# Patient Record
Sex: Female | Born: 1945 | Race: White | Hispanic: No | Marital: Married | Smoking: Never smoker
Health system: Southern US, Community
[De-identification: ages and names within clinical notes are randomized; demographics above are authoritative.]

## PROBLEM LIST (undated history)

## (undated) DIAGNOSIS — J45909 Unspecified asthma, uncomplicated: Secondary | ICD-10-CM

## (undated) DIAGNOSIS — R51 Headache: Secondary | ICD-10-CM

## (undated) DIAGNOSIS — K08109 Complete loss of teeth, unspecified cause, unspecified class: Secondary | ICD-10-CM

## (undated) DIAGNOSIS — F419 Anxiety disorder, unspecified: Secondary | ICD-10-CM

## (undated) DIAGNOSIS — T8859XA Other complications of anesthesia, initial encounter: Secondary | ICD-10-CM

## (undated) DIAGNOSIS — Z8719 Personal history of other diseases of the digestive system: Secondary | ICD-10-CM

## (undated) DIAGNOSIS — T4145XA Adverse effect of unspecified anesthetic, initial encounter: Secondary | ICD-10-CM

## (undated) DIAGNOSIS — IMO0001 Reserved for inherently not codable concepts without codable children: Secondary | ICD-10-CM

## (undated) DIAGNOSIS — M1990 Unspecified osteoarthritis, unspecified site: Secondary | ICD-10-CM

## (undated) DIAGNOSIS — Z972 Presence of dental prosthetic device (complete) (partial): Secondary | ICD-10-CM

## (undated) DIAGNOSIS — N183 Chronic kidney disease, stage 3 unspecified: Secondary | ICD-10-CM

## (undated) DIAGNOSIS — Z87442 Personal history of urinary calculi: Secondary | ICD-10-CM

## (undated) DIAGNOSIS — R519 Headache, unspecified: Secondary | ICD-10-CM

## (undated) DIAGNOSIS — Z9289 Personal history of other medical treatment: Secondary | ICD-10-CM

## (undated) DIAGNOSIS — J189 Pneumonia, unspecified organism: Secondary | ICD-10-CM

## (undated) DIAGNOSIS — K219 Gastro-esophageal reflux disease without esophagitis: Secondary | ICD-10-CM

## (undated) DIAGNOSIS — J3089 Other allergic rhinitis: Secondary | ICD-10-CM

## (undated) DIAGNOSIS — I10 Essential (primary) hypertension: Secondary | ICD-10-CM

## (undated) DIAGNOSIS — N393 Stress incontinence (female) (male): Secondary | ICD-10-CM

## (undated) DIAGNOSIS — D509 Iron deficiency anemia, unspecified: Secondary | ICD-10-CM

## (undated) DIAGNOSIS — M810 Age-related osteoporosis without current pathological fracture: Secondary | ICD-10-CM

## (undated) DIAGNOSIS — J449 Chronic obstructive pulmonary disease, unspecified: Secondary | ICD-10-CM

## (undated) DIAGNOSIS — E039 Hypothyroidism, unspecified: Secondary | ICD-10-CM

## (undated) HISTORY — PX: EYE SURGERY: SHX253

## (undated) HISTORY — PX: JOINT REPLACEMENT: SHX530

## (undated) HISTORY — PX: HERNIA REPAIR: SHX51

## (undated) HISTORY — PX: COLONOSCOPY: SHX174

## (undated) HISTORY — PX: TONSILLECTOMY: SUR1361

## (undated) HISTORY — PX: BACK SURGERY: SHX140

## (undated) HISTORY — PX: ABDOMINAL HYSTERECTOMY: SHX81

## (undated) HISTORY — PX: FRACTURE SURGERY: SHX138

## (undated) HISTORY — PX: CHOLECYSTECTOMY: SHX55

---

## 2010-09-09 DIAGNOSIS — Z9289 Personal history of other medical treatment: Secondary | ICD-10-CM

## 2010-09-09 HISTORY — DX: Personal history of other medical treatment: Z92.89

## 2011-01-16 ENCOUNTER — Other Ambulatory Visit: Payer: Self-pay | Admitting: Neurosurgery

## 2011-01-16 DIAGNOSIS — M479 Spondylosis, unspecified: Secondary | ICD-10-CM

## 2011-01-17 ENCOUNTER — Ambulatory Visit
Admission: RE | Admit: 2011-01-17 | Discharge: 2011-01-17 | Disposition: A | Discharge status: Home / Self Care | Payer: PRIVATE HEALTH INSURANCE | Source: Ambulatory Visit | Attending: Neurosurgery | Admitting: Neurosurgery

## 2011-01-17 DIAGNOSIS — M479 Spondylosis, unspecified: Secondary | ICD-10-CM

## 2011-02-06 ENCOUNTER — Other Ambulatory Visit (HOSPITAL_COMMUNITY): Payer: Self-pay | Admitting: Neurosurgery

## 2011-02-06 ENCOUNTER — Encounter (HOSPITAL_COMMUNITY)
Admission: RE | Admit: 2011-02-06 | Discharge: 2011-02-06 | Disposition: A | Discharge status: Home / Self Care | Payer: PRIVATE HEALTH INSURANCE | Source: Ambulatory Visit | Attending: Neurosurgery | Admitting: Neurosurgery

## 2011-02-06 ENCOUNTER — Ambulatory Visit (HOSPITAL_COMMUNITY)
Admission: RE | Admit: 2011-02-06 | Discharge: 2011-02-06 | Disposition: A | Discharge status: Home / Self Care | Payer: PRIVATE HEALTH INSURANCE | Source: Ambulatory Visit | Attending: Neurosurgery | Admitting: Neurosurgery

## 2011-02-06 DIAGNOSIS — M47816 Spondylosis without myelopathy or radiculopathy, lumbar region: Secondary | ICD-10-CM

## 2011-02-06 DIAGNOSIS — M48061 Spinal stenosis, lumbar region without neurogenic claudication: Secondary | ICD-10-CM

## 2011-02-06 DIAGNOSIS — J438 Other emphysema: Secondary | ICD-10-CM | POA: Insufficient documentation

## 2011-02-06 DIAGNOSIS — M47817 Spondylosis without myelopathy or radiculopathy, lumbosacral region: Secondary | ICD-10-CM | POA: Insufficient documentation

## 2011-02-06 DIAGNOSIS — Z01812 Encounter for preprocedural laboratory examination: Secondary | ICD-10-CM | POA: Insufficient documentation

## 2011-02-06 DIAGNOSIS — Z01811 Encounter for preprocedural respiratory examination: Secondary | ICD-10-CM | POA: Insufficient documentation

## 2011-02-06 DIAGNOSIS — Z01818 Encounter for other preprocedural examination: Secondary | ICD-10-CM | POA: Insufficient documentation

## 2011-02-06 LAB — BASIC METABOLIC PANEL
CO2: 31 mEq/L (ref 19–32)
Calcium: 9.1 mg/dL (ref 8.4–10.5)
Chloride: 102 mEq/L (ref 96–112)
GFR calc Af Amer: 59 mL/min — ABNORMAL LOW (ref >60)
Sodium: 139 mEq/L (ref 135–145)

## 2011-02-06 LAB — SURGICAL PCR SCREEN
MRSA, PCR: NEGATIVE
Staphylococcus aureus: POSITIVE — AB

## 2011-02-06 LAB — URINALYSIS, ROUTINE W REFLEX MICROSCOPIC
Glucose, UA: NEGATIVE mg/dL
Specific Gravity, Urine: 1.01 (ref 1.005–1.030)
Urobilinogen, UA: 0.2 mg/dL (ref 0.0–1.0)

## 2011-02-06 LAB — CBC
HCT: 40.2 % (ref 36.0–46.0)
MCH: 31.2 pg (ref 26.0–34.0)
MCV: 95 fL (ref 78.0–100.0)
Platelets: 308 10*3/uL (ref 150–400)
RDW: 12.1 % (ref 11.5–15.5)

## 2011-02-06 LAB — URINE MICROSCOPIC-ADD ON

## 2011-02-06 LAB — ABO/RH: ABO/RH(D): A POS

## 2011-02-12 ENCOUNTER — Inpatient Hospital Stay (HOSPITAL_COMMUNITY): Payer: PRIVATE HEALTH INSURANCE

## 2011-02-12 ENCOUNTER — Inpatient Hospital Stay (HOSPITAL_COMMUNITY)
Admission: RE | Admit: 2011-02-12 | Discharge: 2011-02-16 | DRG: 460 | Disposition: A | Discharge status: Home / Self Care | Payer: PRIVATE HEALTH INSURANCE | Source: Ambulatory Visit | Attending: Neurosurgery | Admitting: Neurosurgery

## 2011-02-12 DIAGNOSIS — K2210 Ulcer of esophagus without bleeding: Secondary | ICD-10-CM | POA: Diagnosis present

## 2011-02-12 DIAGNOSIS — K920 Hematemesis: Secondary | ICD-10-CM | POA: Diagnosis not present

## 2011-02-12 DIAGNOSIS — Z981 Arthrodesis status: Secondary | ICD-10-CM

## 2011-02-12 DIAGNOSIS — M48061 Spinal stenosis, lumbar region without neurogenic claudication: Secondary | ICD-10-CM | POA: Diagnosis present

## 2011-02-12 DIAGNOSIS — M4716 Other spondylosis with myelopathy, lumbar region: Principal | ICD-10-CM | POA: Diagnosis present

## 2011-02-12 DIAGNOSIS — K449 Diaphragmatic hernia without obstruction or gangrene: Secondary | ICD-10-CM | POA: Diagnosis present

## 2011-02-13 LAB — CBC
MCV: 95.7 fL (ref 78.0–100.0)
Platelets: 216 10*3/uL (ref 150–400)
RBC: 3.47 MIL/uL — ABNORMAL LOW (ref 3.87–5.11)
WBC: 8 10*3/uL (ref 4.0–10.5)

## 2011-02-13 LAB — BASIC METABOLIC PANEL
Chloride: 105 mEq/L (ref 96–112)
GFR calc Af Amer: >60 mL/min (ref >60)
Potassium: 5 mEq/L (ref 3.5–5.1)
Sodium: 135 mEq/L (ref 135–145)

## 2011-02-13 LAB — HEMOGLOBIN AND HEMATOCRIT, BLOOD
HCT: 32.8 % — ABNORMAL LOW (ref 36.0–46.0)
HCT: 30.6 % — ABNORMAL LOW (ref 36.0–46.0)
Hemoglobin: 10.6 g/dL — ABNORMAL LOW (ref 12.0–15.0)
Hemoglobin: 10 g/dL — ABNORMAL LOW (ref 12.0–15.0)
Hemoglobin: 10.7 g/dL — ABNORMAL LOW (ref 12.0–15.0)

## 2011-02-14 LAB — HEMOGLOBIN AND HEMATOCRIT, BLOOD
HCT: 27.9 % — ABNORMAL LOW (ref 36.0–46.0)
HCT: 28.7 % — ABNORMAL LOW (ref 36.0–46.0)
HCT: 29.5 % — ABNORMAL LOW (ref 36.0–46.0)
HCT: 30.5 % — ABNORMAL LOW (ref 36.0–46.0)
Hemoglobin: 9.4 g/dL — ABNORMAL LOW (ref 12.0–15.0)
Hemoglobin: 9.5 g/dL — ABNORMAL LOW (ref 12.0–15.0)
Hemoglobin: 9.8 g/dL — ABNORMAL LOW (ref 12.0–15.0)
Hemoglobin: 9.8 g/dL — ABNORMAL LOW (ref 12.0–15.0)

## 2011-02-14 LAB — CBC
HCT: 30.4 % — ABNORMAL LOW (ref 36.0–46.0)
Hemoglobin: 9.6 g/dL — ABNORMAL LOW (ref 12.0–15.0)
RDW: 12.2 % (ref 11.5–15.5)
WBC: 10.4 10*3/uL (ref 4.0–10.5)

## 2011-02-14 LAB — TYPE AND SCREEN
ABO/RH(D): A POS
Unit division: 0

## 2011-02-15 LAB — HEMOGLOBIN AND HEMATOCRIT, BLOOD
HCT: 27.5 % — ABNORMAL LOW (ref 36.0–46.0)
HCT: 27 % — ABNORMAL LOW (ref 36.0–46.0)
HCT: 28 % — ABNORMAL LOW (ref 36.0–46.0)
Hemoglobin: 9 g/dL — ABNORMAL LOW (ref 12.0–15.0)
Hemoglobin: 9.1 g/dL — ABNORMAL LOW (ref 12.0–15.0)

## 2011-02-16 LAB — CBC
Hemoglobin: 9.3 g/dL — ABNORMAL LOW (ref 12.0–15.0)
MCH: 31 pg (ref 26.0–34.0)
Platelets: 245 10*3/uL (ref 150–400)
RBC: 3 MIL/uL — ABNORMAL LOW (ref 3.87–5.11)
WBC: 8 10*3/uL (ref 4.0–10.5)

## 2011-03-07 NOTE — Op Note (Signed)
  Jade Walker, Jade Walker             ACCOUNT NO.:  [id number]  MEDICAL RECORD NO.:  [id number]  LOCATION:  3108                         FACILITY:  [facility]  PHYSICIAN:  Clydene Fake, M.D.  DATE OF BIRTH:  March 22, 1946  DATE OF PROCEDURE:  02/12/2011 DATE OF DISCHARGE:                              OPERATIVE REPORT   ADDENDUM  We used a high-speed drill to decorticate the lateral facets and drilling the process of L3-L4, and the rest of the autograft bone with DBX putty along with Healos.  This was packed in the posterolateral space for posterolateral fusion.  Also, when we entered the pedicles, we aspirated the bone marrow aspirate from the pedicles, and this replaced onto the Healos prior to packing the posterolateral gutters.  We then explored the dura and nerve roots again.  We had a good a decompression of the nerve roots.  We had good hemostasis.  Gelfoam was placed over the lateral gutters.  Retractors were removed.  Fascia closed with 0 Vicryl interrupted sutures.  Subcutaneous tissue closed with 2-0 and 3-0 Vicryl interrupted sutures.  Skin closed with benzoin and Steri-Strips. Dressing was placed.  The patient was placed back into supine position, awoken from anesthesia, and transferred to the recovery room in stable condition.          ______________________________ Clydene Fake, M.D.     JRH/MEDQ  D:  02/12/2011  T:  02/13/2011  Job:  [id number]  Electronically Signed by Colon Branch M.D. on 03/07/2011 02:22:30 PM

## 2011-03-07 NOTE — Op Note (Signed)
NAMECHARLEY, LAFRANCE NO.:  [id number]  MEDICAL RECORD NO.:  [id number]  LOCATION:  3108                         FACILITY:  [facility]  PHYSICIAN:  Clydene Fake, M.D.  DATE OF BIRTH:  August 22, 1946  DATE OF PROCEDURE:  02/12/2011 DATE OF DISCHARGE:  02/06/2011                              OPERATIVE REPORT   PREOPERATIVE DIAGNOSES:  Lumbar stenosis spondylosis prior surgery and transitional level with radiculopathy at L3-4.  POSTOPERATIVE DIAGNOSES:  Lumbar stenosis spondylosis prior surgery and transitional level with radiculopathy at L3-4.  PROCEDURE:  Redo decompressive laminectomy, decompressing the L3-L4 (two levels), posterior lumbar interbody fusion L3-4, Saber interbody cages at L3-4, Expedium nonsegmental pedicle screw fixation L3-4, posterolateral fusion L3-4, autograft, same incision; allograft (Healos), bone marrow aspirate.  SURGEON:  Clydene Fake, MD  ASSISTANT:  Cristi Loron, MD  ANESTHESIA:  General endotracheal tube anesthesia.  ESTIMATED BLOOD LOSS:  800 mL.  BLOOD GIVEN:  250 mL, CellSaver return.  COMPLICATIONS:  None.  DRAINS:  None.  REASON FOR PROCEDURE:  The patient is 65 year old woman with a significant back and right leg pain and epidural injections did help, but the pain started worsening.  Workup included x-rays, MRI, and CT procedure prior to __________ at L3-4.  There is a significant facet hypertrophy __________disk generation causing severe stenosis.  The patient brought in for  decompression and fusion at the L3-4 levels.  PROCEDURE IN DETAIL:  The patient was brought to the operating room and general anesthesia was induced.  The patient was placed in a prone position on a Wilson frame with all pressure points padded.  The patient was prepped and draped in sterile fashion.  General anesthesia, injected with 20 mL of 1% lidocaine with epinephrine.  Incision was then made at site of previous scar.  In the  midline, lumbar spine was opened and sized and incision taken down to fascia.  Hemostasis was obtained with Bovie cauterization.  The fascia was incised with a Bovie on the right side, and subperiosteal dissection was done over the L4 spinous process and lamina dissected inferiorly where we __________ pedicle screws and rods, we dissected this out __________ rods and screws.  We were able to remove the rod construct __________.  Continued dissection, __________ transverse process of L3 and sent down to the lamina of __________ scar, and the spinous process of 3 was actually removed.  We irrigated the __________ lamina and then laminectomy was done with Kerrison punches and Leksell rongeurs decompressing the central canal L3-L4.  Medial facetectomies were done bilaterally __________lateral gutters with __________  extensive decompression __________ .  We removed the __________ very good decompression at that point __________ bilaterally. __________ diskectomy.  Disk space was prepared for interbody fusion with interbody spreaders, scrapers, and broaches.  We were able to distract the interspace 10 mm __________ interbody fusion.  Two separate cages, 10 mm long were packed with autograft bone, __________ disk space and the cages in good position bilaterally __________.  We then removed the 2 separate screws __________  rods and the interbody cages.  The high-speed drill to __________  DICTATION ENDED AT THIS POINT.  ______________________________ Clydene Fake, M.D.     JRH/MEDQ  D:  02/12/2011  T:  02/13/2011  Job:  [id number]  Electronically Signed by Colon Branch M.D. on 03/07/2011 02:22:31 PM

## 2011-03-27 NOTE — Op Note (Signed)
  NAMESAHILY, BIDDLE             ACCOUNT NO.:  [id number]  MEDICAL RECORD NO.:  [id number]  LOCATION:  3108                         FACILITY:  [facility]  PHYSICIAN:  Nioka Thorington C. Madilyn Fireman, M.D.    DATE OF BIRTH:  July 30, 1946  DATE OF PROCEDURE:  02/15/2011 DATE OF DISCHARGE:                              OPERATIVE REPORT   REASON FOR CONSULTATION:  Hematemesis with previous EGD showing adherent clot in esophagus with poorly visualized mucosa underneath.  PROCEDURE:  The patient was placed in the left lateral decubitus position and placed on the pulse monitor with continuous low-flow oxygen delivered by nasal cannula.  She was sedated with 40 mcg IV fentanyl and 4 mg IV Versed.  Olympus video endoscope was advanced under direct vision into the oropharynx and esophagus.  The esophagus was straight and of normal caliber with squamocolumnar line at 38 cm.  There was a long, relatively broad, longitudinally oriented ulcer extending from 30 cm to 37 cm.  It was somewhat broad and covered with an extensive exudate that no clot, no visible vessel.  There was no visible suspicion of neoplasm and no varices seen.  There was a short distance between the distal end of the ulcer and the squamocolumnar line.  There was a fairly large 3-cm prominent hiatal hernia distal to the squamocolumnar line. Stomach was entered, a small amount of liquid secretions were suctioned from the fundus.  Retroflexed view of cardia was unremarkable.  The fundus, body, antrum and pylorus all appeared normal.  The duodenum was entered and both bulb and second portion well inspected and appeared be within normal limits.  The scope was then withdrawn and the patient returned to the recovery room in stable condition.  She tolerated the procedure well.  There were no immediate complications.  IMPRESSION: 1. 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 2. Large hiatal hernia.  PLAN:  Double dose proton pump  inhibitor for 2 months.  We will need repeat endoscopic examination to confirm healing better and completely exclude any neoplastic process.          ______________________________ Everardo All Madilyn Fireman, M.D.     JCH/MEDQ  D:  02/15/2011  T:  02/15/2011  Job:  [id number]  cc:   Dr. Remonia Richter  Electronically Signed by Dorena Cookey M.D. on 03/27/2011 06:57:54 PM

## 2011-03-27 NOTE — Op Note (Signed)
  NAMEJALESSA, Jade Walker             ACCOUNT NO.:  [id number]  MEDICAL RECORD NO.:  [id number]  LOCATION:  3108                         FACILITY:  [facility]  PHYSICIAN:  Aaira Oestreicher C. Madilyn Fireman, M.D.    DATE OF BIRTH:  November 18, 1945  DATE OF PROCEDURE:  02/13/2011 DATE OF DISCHARGE:                              OPERATIVE REPORT   PROCEDURE:  Esophagogastroduodenoscopy.  SURGEON:  Carrington Mullenax C. Madilyn Fireman, MD  INDICATIONS FOR PROCEDURE:  Hematemesis after back surgery yesterday.  PROCEDURE IN DETAIL:  The patient was placed in the left lateral decubitus position and placed on the pulse monitor with continuous low- flow oxygen delivered via nasal cannula.  She was sedated with 50 mcg IV fentanyl and 4 mg IV Versed.  The Olympus video endoscope was advanced under direct vision into the oropharynx and esophagus.  The esophagus was straight and of normal caliber.  However, there was a fairly large well-organized clot in the esophagus.  It had an oblong shape and appeared to be attached in the distal esophagus.  Despite trying to knocking off with lavage and suction up to the endoscopic channel, the clot would not dislodge.  There appeared to be exudate at the distal margin of the clot extending to the GE junction above a fairly large hiatal hernia indicating esophagitis and possible esophageal ulcer.  I elected not to snare off the clot or do any more aggressive maneuvers. No varices were seen and no definite Mallory-Weiss tear, although I could not rule this out.  The stomach was entered and there was a moderate amount of clotted blood and dark bilious fluid in the fundus, not all of which would suction or lavage away to allow inspection of the entire stomach.  Retroflexed view of cardia revealed a large hiatal hernia and no other abnormalities.  With the exception of the dependent portions of the gastric lake some of which I could not see, the fundus, body, and her antrum and pylorus all appeared normal.  The  duodenum was entered.  Both bulb and second portion were inspected and appeared to be within normal limits.  The scope was then withdrawn and the patient returned to the recovery room in stable condition.  She tolerated the procedure well and there were no immediate complications.  IMPRESSION:  Adherent blood clot in the esophagus, possibly overlying distal ulcer or Mallory-Weiss tear.  PLAN:  Keep on clear liquid diet, intensive IV proton pump inhibitor therapy, and we will probably repeat endoscopy in 2 days.          ______________________________ Everardo All Madilyn Fireman, M.D.     JCH/MEDQ  D:  02/13/2011  T:  02/14/2011  Job:  [id number]  Electronically Signed by Dorena Cookey M.D. on 03/27/2011 06:57:52 PM

## 2011-04-03 NOTE — Consult Note (Signed)
  NAME:  JANYE, MAYNOR NO.:  [id number]  MEDICAL RECORD NO.:  [id number]  LOCATION:  3108                         FACILITY:  [facility]  PHYSICIAN:  Graylin Shiver, M.D.   DATE OF BIRTH:  02-27-1946  DATE OF CONSULTATION:  02/13/2011 DATE OF DISCHARGE:                                CONSULTATION   REASON FOR CONSULTATION:  The patient is a 65 year old female status post lumbar laminectomy today.  She was doing fine after the surgery and in fact had a late dinner this evening, finishing some chicken and potatoes around 8 or 8:30.  Later in the evening, she started to vomit small amounts of bright red blood.  She has been noticing intermittent dysphagia to meat for the past year.  She does have a history of a hiatal hernia repair years ago and recalls having had her esophagus stretched prior to that.  She gives no history of peptic ulcer disease. She denies alcohol or liver disease.  There is no history of melena or hematochezia.  PAST MEDICAL HISTORY:  Hiatal hernia, asthma.  SURGERIES:  Hiatal hernia surgery, cholecystectomy, total abdominal hysterectomy, lumbar laminectomy today.  ALLERGIES:  HIGH-DOSE HYDROCODONE.  SOCIAL HISTORY:  Negative for alcohol or smoking.  SYSTEMS REVIEW:  No other specific complaints at this time.  PHYSICAL EXAMINATION:  VITAL SIGNS:  Stable.  She has been transferred over to the ICU.  She is not hypotensive and her pulse is 65. GENERAL:  She is alert and oriented and does not appear in any acute distress. HEART:  Reveals a regular rhythm. LUNGS:  Clear. ABDOMEN:  Normal bowel sounds.  It is soft and nontender.  Hemoglobin and hematocrit this evening are 10.8 and 33.2 respectively. Hemoglobin and hematocrit on Feb 06, 2011, were 13.2 and 40.2 respectively.  IMPRESSION: 1. Upper gastrointestinal bleed. 2. Status post lumbar laminectomy today. 3. History of hiatal hernia repair in the past. 4. Intermittent dysphagia over the  past year, rule out esophageal     stricture versus other etiologies.  PLAN:  The patient has been transferred to the ICU this evening.  She appears clinically stable.  I will begin her on an IV Protonix drip after a bolus, obtain serial H and H's, blood will be available for transfusion as necessary.  We will plan endoscopy in the morning and I do not think she needs an emergency endoscopy immediately that should the situation change, we will be available.          ______________________________ Graylin Shiver, M.D.     SFG/MEDQ  D:  02/13/2011  T:  02/13/2011  Job:  [id number]  cc:   Clydene Fake, M.D. Dr. Altamease Oiler  Electronically Signed by Herbert Moors MD on 04/03/2011 03:41:47 PM

## 2011-04-08 NOTE — Discharge Summary (Signed)
  [name], [name]:  [id number]  MEDICAL RECORD NO.:  [id number]  LOCATION:  3005                         FACILITY:  [facility]  PHYSICIAN:  Clydene Fake, M.D.  DATE OF BIRTH:  03/10/1946  DATE OF ADMISSION:  02/12/2011 DATE OF DISCHARGE:  02/16/2011                              DISCHARGE SUMMARY   ADMISSION DIAGNOSES:  Lumbar stenosis, spondylosis with prior surgery and transition level at L3-L4 with radiculopathy, gastric ulcer with upper gastrointestinal bleeding.  DISCHARGE DIAGNOSES:  Lumbar stenosis, spondylosis with prior surgery and transition level at L3-L4 with radiculopathy, gastric ulcer with upper gastrointestinal bleeding.  PROCEDURE:  Redo decompressive laminectomy, decompressing the L3 and L4 nerve roots with posterior lumbar interbody fusion L3-4, Saber interbody cages and Expedium nonsegmental pedicle screw fixation with posterolateral fusion autograft, allograft, and bone marrow aspirate.  REASON FOR ADMISSION:  The patient is a 65 year old woman with a significant back and right leg pain and epidural injections which did help, but the pain has been worsening.  Workup included x-rays, MRI, and CT showing significant facet hypertrophy, degenerative disk disease and disk bulge causing severe stenosis at L3-4 level.  The patient brought in for decompression and fusion.  HOSPITAL COURSE:  The patient was admitted on the day of surgery and underwent the procedure without complications.  Postop, the patient was transferred to the recovery room and then to the floor.  Postop she was moving  all four extremities well and doing well.  She continued making progress, but the first night of surgery on February 16, 2011, with acute GI hemorrhage and the patient was  transferred to the intensive care unit and GI medicine was consulted.  On February 13, 2011, the patient holding steady with her  H and H and was getting ready for EGD, which was done and  showed early clot but  no active bleeding and they wanted to repeat it in a couple of days.  She continued to be watched, doing well.  She started to be up ambulating, legs working much better than the preop status and  neurosurgically was doing well.  The patient continued being up, a little bit of blood but was otherwise stable.  EGD was reviewed and showed a linear ulcer, but no clots.  No active bleeding and actually well treated medically and told to use  proton pump inhibitors and followup with GI in a month and potentially repeat endoscopic evaluation at some point down the road.  The patient with  Neurosurgery was doing well.  The patient was discharged to home  on February 16, 2011.  DISCHARGE MEDS:  Same as prehospitalization plus Protonix 40 mg b.i.d. and Vicodin p.r.n. pain.  Follow up with me in 2-3 weeks with GI medicine in 3-4 weeks.          ______________________________ Clydene Fake, M.D.     JRH/MEDQ  D:  03/25/2011  T:  03/25/2011  Job:  [id number]  Electronically Signed by Colon Branch M.D. on 04/08/2011 11:35:25 AM

## 2015-03-01 ENCOUNTER — Other Ambulatory Visit (HOSPITAL_COMMUNITY): Payer: Self-pay | Admitting: Orthopedic Surgery

## 2015-03-03 ENCOUNTER — Encounter (HOSPITAL_COMMUNITY)
Admission: RE | Admit: 2015-03-03 | Discharge: 2015-03-03 | Disposition: A | Discharge status: Home / Self Care | Payer: Medicare HMO | Source: Ambulatory Visit | Attending: Orthopedic Surgery | Admitting: Orthopedic Surgery

## 2015-03-03 ENCOUNTER — Encounter (HOSPITAL_COMMUNITY): Payer: Self-pay

## 2015-03-03 ENCOUNTER — Other Ambulatory Visit (HOSPITAL_COMMUNITY): Payer: Self-pay | Admitting: *Deleted

## 2015-03-03 ENCOUNTER — Ambulatory Visit (HOSPITAL_COMMUNITY)
Admission: RE | Admit: 2015-03-03 | Discharge: 2015-03-03 | Disposition: A | Discharge status: Home / Self Care | Payer: Medicare HMO | Source: Ambulatory Visit | Attending: Orthopedic Surgery | Admitting: Orthopedic Surgery

## 2015-03-03 DIAGNOSIS — Z01812 Encounter for preprocedural laboratory examination: Secondary | ICD-10-CM | POA: Diagnosis not present

## 2015-03-03 DIAGNOSIS — Z01810 Encounter for preprocedural cardiovascular examination: Secondary | ICD-10-CM | POA: Diagnosis not present

## 2015-03-03 DIAGNOSIS — Z01818 Encounter for other preprocedural examination: Secondary | ICD-10-CM

## 2015-03-03 DIAGNOSIS — I10 Essential (primary) hypertension: Secondary | ICD-10-CM | POA: Diagnosis not present

## 2015-03-03 DIAGNOSIS — M5134 Other intervertebral disc degeneration, thoracic region: Secondary | ICD-10-CM | POA: Diagnosis not present

## 2015-03-03 DIAGNOSIS — K219 Gastro-esophageal reflux disease without esophagitis: Secondary | ICD-10-CM | POA: Insufficient documentation

## 2015-03-03 DIAGNOSIS — R05 Cough: Secondary | ICD-10-CM | POA: Insufficient documentation

## 2015-03-03 DIAGNOSIS — Z0183 Encounter for blood typing: Secondary | ICD-10-CM | POA: Insufficient documentation

## 2015-03-03 DIAGNOSIS — K449 Diaphragmatic hernia without obstruction or gangrene: Secondary | ICD-10-CM | POA: Diagnosis not present

## 2015-03-03 DIAGNOSIS — J45909 Unspecified asthma, uncomplicated: Secondary | ICD-10-CM | POA: Insufficient documentation

## 2015-03-03 HISTORY — DX: Anxiety disorder, unspecified: F41.9

## 2015-03-03 HISTORY — DX: Unspecified asthma, uncomplicated: J45.909

## 2015-03-03 HISTORY — DX: Other allergic rhinitis: J30.89

## 2015-03-03 HISTORY — DX: Iron deficiency anemia, unspecified: D50.9

## 2015-03-03 HISTORY — DX: Adverse effect of unspecified anesthetic, initial encounter: T41.45XA

## 2015-03-03 HISTORY — DX: Other complications of anesthesia, initial encounter: T88.59XA

## 2015-03-03 HISTORY — DX: Essential (primary) hypertension: I10

## 2015-03-03 HISTORY — DX: Personal history of other diseases of the digestive system: Z87.19

## 2015-03-03 HISTORY — DX: Stress incontinence (female) (male): N39.3

## 2015-03-03 HISTORY — DX: Unspecified osteoarthritis, unspecified site: M19.90

## 2015-03-03 HISTORY — DX: Pneumonia, unspecified organism: J18.9

## 2015-03-03 HISTORY — DX: Complete loss of teeth, unspecified cause, unspecified class: K08.109

## 2015-03-03 HISTORY — DX: Gastro-esophageal reflux disease without esophagitis: K21.9

## 2015-03-03 HISTORY — DX: Age-related osteoporosis without current pathological fracture: M81.0

## 2015-03-03 HISTORY — DX: Complete loss of teeth, unspecified cause, unspecified class: Z97.2

## 2015-03-03 HISTORY — DX: Hypothyroidism, unspecified: E03.9

## 2015-03-03 LAB — URINALYSIS, ROUTINE W REFLEX MICROSCOPIC
Bilirubin Urine: NEGATIVE
GLUCOSE, UA: NEGATIVE mg/dL
KETONES UR: NEGATIVE mg/dL
LEUKOCYTES UA: NEGATIVE
NITRITE: NEGATIVE
PROTEIN: NEGATIVE mg/dL
Specific Gravity, Urine: 1.006 (ref 1.005–1.030)
UROBILINOGEN UA: 0.2 mg/dL (ref 0.0–1.0)
pH: 7 (ref 5.0–8.0)

## 2015-03-03 LAB — BASIC METABOLIC PANEL
Anion gap: 8 (ref 5–15)
BUN: 12 mg/dL (ref 6–20)
CO2: 29 mmol/L (ref 22–32)
Calcium: 8.9 mg/dL (ref 8.9–10.3)
Chloride: 99 mmol/L — ABNORMAL LOW (ref 101–111)
Creatinine, Ser: 1.42 mg/dL — ABNORMAL HIGH (ref 0.44–1.00)
GFR, EST AFRICAN AMERICAN: 43 mL/min — AB (ref >60)
GFR, EST NON AFRICAN AMERICAN: 37 mL/min — AB (ref >60)
GLUCOSE: 89 mg/dL (ref 65–99)
Potassium: 3.4 mmol/L — ABNORMAL LOW (ref 3.5–5.1)
Sodium: 136 mmol/L (ref 135–145)

## 2015-03-03 LAB — URINE MICROSCOPIC-ADD ON

## 2015-03-03 LAB — CBC
HEMATOCRIT: 36.4 % (ref 36.0–46.0)
HEMOGLOBIN: 11.6 g/dL — AB (ref 12.0–15.0)
MCH: 29.8 pg (ref 26.0–34.0)
MCHC: 31.9 g/dL (ref 30.0–36.0)
MCV: 93.6 fL (ref 78.0–100.0)
PLATELETS: 314 10*3/uL (ref 150–400)
RBC: 3.89 MIL/uL (ref 3.87–5.11)
RDW: 12.8 % (ref 11.5–15.5)
WBC: 6.3 10*3/uL (ref 4.0–10.5)

## 2015-03-03 LAB — TYPE AND SCREEN
ABO/RH(D): A POS
ANTIBODY SCREEN: NEGATIVE

## 2015-03-03 LAB — SURGICAL PCR SCREEN
MRSA, PCR: POSITIVE — AB
Staphylococcus aureus: POSITIVE — AB

## 2015-03-03 NOTE — Pre-Procedure Instructions (Signed)
Jade Walker  03/03/2015     Your procedure is scheduled on Tuesday, March 14, 2015 at 7:30 AM.   Report to [facility] Entrance "A" Admitting Office at 5:30 AM.   Call this number if you have problems the morning of surgery: [phone]   Any questions prior to day of surgery, please call [phone] between 8 & 4 PM.   Remember:  Do not eat food or drink liquids after midnight Monday, 03/13/15.  Take these medicines the morning of surgery with A SIP OF WATER: Cetirizine (Zyrtec), Citalopram (Celexa), Levothyroxine (Synthroid), Omeprazole (Prilosec), Advair, Hydrocodone - if needed  Stop NSAIDS (Meloxicam, Aleve, Ibuprofen, etc) 7 days prior to surgery. Do not use any Aspirin products prior to surgery    Do not wear jewelry, make-up or nail polish.  Do not wear lotions, powders, or perfumes.  You may wear deodorant.  Do not shave 48 hours prior to surgery.   Do not bring valuables to the hospital.  [facility] is not responsible for any belongings or valuables.  Contacts, dentures or bridgework may not be worn into surgery.  Leave your suitcase in the car.  After surgery it may be brought to your room.  For patients admitted to the hospital, discharge time will be determined by your treatment team.  Patients discharged the day of surgery will not be allowed to drive home.   Special instructions:   - Preparing for Surgery  Before surgery, you can play an important role.  Because skin is not sterile, your skin needs to be as free of germs as possible.  You can reduce the number of germs on you skin by washing with CHG (chlorahexidine gluconate) soap before surgery.  CHG is an antiseptic cleaner which kills germs and bonds with the skin to continue killing germs even after washing.  Please DO NOT use if you have an allergy to CHG or antibacterial soaps.  If your skin becomes reddened/irritated stop using the CHG and inform your nurse when you arrive at Short  Stay.  Do not shave (including legs and underarms) for at least 48 hours prior to the first CHG shower.  You may shave your face.  Please follow these instructions carefully:   1.  Shower with CHG Soap the night before surgery and the                                morning of Surgery.  2.  If you choose to wash your hair, wash your hair first as usual with your       normal shampoo.  3.  After you shampoo, rinse your hair and body thoroughly to remove the                      Shampoo.  4.  Use CHG as you would any other liquid soap.  You can apply chg directly       to the skin and wash gently with scrungie or a clean washcloth.  5.  Apply the CHG Soap to your body ONLY FROM THE NECK DOWN.        Do not use on open wounds or open sores.  Avoid contact with your eyes, ears, mouth and genitals (private parts).  Wash genitals (private parts) with your normal soap.  6.  Wash thoroughly, paying special attention to the area where your surgery  will be performed.  7.  Thoroughly rinse your body with warm water from the neck down.  8.  DO NOT shower/wash with your normal soap after using and rinsing off       the CHG Soap.  9.  Pat yourself dry with a clean towel.            10.  Wear clean pajamas.            11.  Place clean sheets on your bed the night of your first shower and do not        sleep with pets.  Day of Surgery  Do not apply any lotions the morning of surgery.  Please wear clean clothes to the hospital.    Please read over the following fact sheets that you were given. Pain Booklet, Coughing and Deep Breathing, MRSA Information and Surgical Site Infection Prevention

## 2015-03-03 NOTE — Progress Notes (Signed)
Mupirocin Ointment rx called into [facility] on [address] in [place] for positive PCR and Staph. Pt notified and voiced understanding.

## 2015-03-03 NOTE — Progress Notes (Signed)
Called Dr. Diamantina Providence office to request pre-op orders be signed, spoke with Toniann Fail.

## 2015-03-05 LAB — URINE CULTURE

## 2015-03-06 ENCOUNTER — Other Ambulatory Visit (HOSPITAL_COMMUNITY): Payer: Self-pay | Admitting: Orthopedic Surgery

## 2015-03-07 ENCOUNTER — Encounter (HOSPITAL_COMMUNITY): Payer: Self-pay

## 2015-03-07 ENCOUNTER — Encounter (HOSPITAL_COMMUNITY): Payer: Self-pay | Admitting: Vascular Surgery

## 2015-03-07 NOTE — Progress Notes (Signed)
Anesthesia Chart Review: Patient is a 69 year old female scheduled for left TKA on 03/14/15 by Dr. August Walker.  History includes non-smoker, GERD, anxiety, stress incontinence, iron deficiency anemia, hiatal hernia repair , HTN, asthma, hypothyroidism, nephrolithiasis, asthma, hysterectomy, tonsillectomy, cholecystectomy, decompressive laminectomy L4-3 with PLIF 02/12/11 complicated by post-operative hematemesis that evening (EGD showed esophageal ulcer). BMI is 30 consistent with mild obesity. PCP is Jade ShepherdLori Beane, PA-C (see Care Everywhere). Office visit from 02/10/15 indicates that PCP was aware that patient was in need for right shoulder surgery and left TKA. Patient is planning GI (Dr. Madilyn Walker) follow-up post-operatively, but notes indicate her H/H have been stable.   02/13/11 EGD: IMPRESSION: Adherent blood clot in the esophagus, possibly overlying distal ulcer or Mallory-Weiss tear. F/U 02/15/11 EGD: IMPRESSION: 1. 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 2. Large hiatal hernia.  Meds include Zyrtec, Celexa, Advair, levothyroxine, Mobic, Prilosec, Zanaflex, Norco, diethylpropion (sympathomimetic used for weight loss). I spoke with Jade Walker in Pharmacy, recommended patient hold diethylpropion over the weekend and not resume until after her surgery. I also reviewed diethylpropion with anesthesiologist Dr. Renold Walker with recommendation to go a head a hold this medication until after surgery.  I notified patient.   03/03/15 EKG: NSR, poor r wave progression. No significant change since 02/12/11 tracing.  10/30/10 Stress echo (HPR): Poor exercise tolerance. Low normal resting LV systolic function 55%. Hypertensive resting BP with normal response. LV function at peak exercise in not hyperdynamic but there are no focal wall motion abnormalities. Negative exercise treadmill test. Equivocal stress echo with no focal wall motion abnormality post exercise, but without hyperdynamic response.   03/03/15  CXR: IMPRESSION: Moderate-sized hiatal hernia. Bronchitic changes and hyperinflation consistent with COPD.  Preoperative labs noted. Cr 1.42. Glucose 89. H/H 11.6/36.4. Patient denied any known obvious GI blodd loss. She is on a PPI.  If no acute changes then I would anticipate that she could proceed as planned.   Jade Ochsllison Hillary Schwegler, PA-C [facility] Phone [phone] 03/07/2015 4:48 PM

## 2015-03-10 ENCOUNTER — Other Ambulatory Visit: Payer: Self-pay | Admitting: Orthopedic Surgery

## 2015-03-10 NOTE — Progress Notes (Signed)
Pt previously contacted by surgeons office; pt advised  to arrive at 8:15 AM instead of 8:30 AM as office had instructed.

## 2015-03-13 MED ORDER — VANCOMYCIN HCL IN DEXTROSE 1-5 GM/200ML-% IV SOLN: 1000.0000 mg | INTRAVENOUS | Status: DC

## 2015-03-13 MED ORDER — VANCOMYCIN HCL IN DEXTROSE 1-5 GM/200ML-% IV SOLN
1000.0000 mg | INTRAVENOUS | Status: AC
  Administered 2015-03-14: 1000 mg via INTRAVENOUS
  Filled 2015-03-13: qty 200

## 2015-03-14 ENCOUNTER — Ambulatory Visit (HOSPITAL_COMMUNITY)
Admission: RE | Admit: 2015-03-14 | Discharge: 2015-03-14 | Disposition: A | Discharge status: Home / Self Care | Payer: Medicare HMO | Source: Ambulatory Visit | Attending: Orthopedic Surgery | Admitting: Orthopedic Surgery

## 2015-03-14 ENCOUNTER — Inpatient Hospital Stay (HOSPITAL_COMMUNITY): Admission: RE | Admit: 2015-03-14 | Payer: Medicare HMO | Source: Ambulatory Visit | Admitting: Orthopedic Surgery

## 2015-03-14 ENCOUNTER — Encounter (HOSPITAL_COMMUNITY): Payer: Self-pay | Admitting: *Deleted

## 2015-03-14 ENCOUNTER — Encounter (HOSPITAL_COMMUNITY)
Admission: RE | Disposition: A | Discharge status: Home / Self Care | Payer: Self-pay | Source: Ambulatory Visit | Attending: Orthopedic Surgery

## 2015-03-14 ENCOUNTER — Ambulatory Visit (HOSPITAL_COMMUNITY): Payer: Medicare HMO

## 2015-03-14 ENCOUNTER — Ambulatory Visit (HOSPITAL_COMMUNITY): Payer: Medicare HMO | Admitting: Anesthesiology

## 2015-03-14 ENCOUNTER — Encounter (HOSPITAL_COMMUNITY): Admission: RE | Payer: Self-pay | Source: Ambulatory Visit

## 2015-03-14 DIAGNOSIS — S62109A Fracture of unspecified carpal bone, unspecified wrist, initial encounter for closed fracture: Secondary | ICD-10-CM

## 2015-03-14 DIAGNOSIS — W1830XA Fall on same level, unspecified, initial encounter: Secondary | ICD-10-CM | POA: Insufficient documentation

## 2015-03-14 DIAGNOSIS — Z87442 Personal history of urinary calculi: Secondary | ICD-10-CM | POA: Diagnosis not present

## 2015-03-14 DIAGNOSIS — I10 Essential (primary) hypertension: Secondary | ICD-10-CM | POA: Diagnosis not present

## 2015-03-14 DIAGNOSIS — Z8249 Family history of ischemic heart disease and other diseases of the circulatory system: Secondary | ICD-10-CM | POA: Insufficient documentation

## 2015-03-14 DIAGNOSIS — S52572A Other intraarticular fracture of lower end of left radius, initial encounter for closed fracture: Secondary | ICD-10-CM | POA: Diagnosis not present

## 2015-03-14 DIAGNOSIS — K449 Diaphragmatic hernia without obstruction or gangrene: Secondary | ICD-10-CM | POA: Diagnosis not present

## 2015-03-14 DIAGNOSIS — M25532 Pain in left wrist: Secondary | ICD-10-CM | POA: Diagnosis not present

## 2015-03-14 DIAGNOSIS — E039 Hypothyroidism, unspecified: Secondary | ICD-10-CM | POA: Diagnosis not present

## 2015-03-14 DIAGNOSIS — J45909 Unspecified asthma, uncomplicated: Secondary | ICD-10-CM | POA: Diagnosis not present

## 2015-03-14 DIAGNOSIS — K219 Gastro-esophageal reflux disease without esophagitis: Secondary | ICD-10-CM | POA: Insufficient documentation

## 2015-03-14 DIAGNOSIS — F419 Anxiety disorder, unspecified: Secondary | ICD-10-CM | POA: Diagnosis not present

## 2015-03-14 DIAGNOSIS — S52502A Unspecified fracture of the lower end of left radius, initial encounter for closed fracture: Secondary | ICD-10-CM | POA: Diagnosis present

## 2015-03-14 DIAGNOSIS — N289 Disorder of kidney and ureter, unspecified: Secondary | ICD-10-CM | POA: Insufficient documentation

## 2015-03-14 HISTORY — PX: OPEN REDUCTION INTERNAL FIXATION (ORIF) DISTAL RADIAL FRACTURE: SHX5989

## 2015-03-14 SURGERY — ARTHROPLASTY, KNEE, TOTAL
Anesthesia: General | Site: Knee | Laterality: Left

## 2015-03-14 SURGERY — OPEN REDUCTION INTERNAL FIXATION (ORIF) DISTAL RADIUS FRACTURE
Anesthesia: Regional | Site: Arm Lower | Laterality: Left

## 2015-03-14 MED ORDER — LIDOCAINE HCL (CARDIAC) 20 MG/ML IV SOLN
INTRAVENOUS | Status: DC | PRN
  Administered 2015-03-14: 40 mg via INTRAVENOUS

## 2015-03-14 MED ORDER — BUPIVACAINE-EPINEPHRINE (PF) 0.25% -1:200000 IJ SOLN
INTRAMUSCULAR | Status: AC
  Filled 2015-03-14: qty 30

## 2015-03-14 MED ORDER — BUPIVACAINE-EPINEPHRINE (PF) 0.5% -1:200000 IJ SOLN: INTRAMUSCULAR | Status: DC | PRN

## 2015-03-14 MED ORDER — PROPOFOL 10 MG/ML IV BOLUS
INTRAVENOUS | Status: AC
  Filled 2015-03-14: qty 20

## 2015-03-14 MED ORDER — FENTANYL CITRATE (PF) 250 MCG/5ML IJ SOLN
INTRAMUSCULAR | Status: AC
  Filled 2015-03-14: qty 5

## 2015-03-14 MED ORDER — DEXAMETHASONE SODIUM PHOSPHATE 4 MG/ML IJ SOLN
INTRAMUSCULAR | Status: AC
  Filled 2015-03-14: qty 1

## 2015-03-14 MED ORDER — EPHEDRINE SULFATE 50 MG/ML IJ SOLN
INTRAMUSCULAR | Status: DC | PRN
  Administered 2015-03-14 (×2): 10 mg via INTRAVENOUS

## 2015-03-14 MED ORDER — CEFAZOLIN SODIUM-DEXTROSE 2-3 GM-% IV SOLR
INTRAVENOUS | Status: DC | PRN
  Administered 2015-03-14: 2 g via INTRAVENOUS

## 2015-03-14 MED ORDER — PROPOFOL 10 MG/ML IV BOLUS
INTRAVENOUS | Status: DC | PRN
  Administered 2015-03-14: 120 mg via INTRAVENOUS

## 2015-03-14 MED ORDER — CEFAZOLIN SODIUM-DEXTROSE 2-3 GM-% IV SOLR
INTRAVENOUS | Status: AC
  Filled 2015-03-14: qty 50

## 2015-03-14 MED ORDER — CEFAZOLIN SODIUM-DEXTROSE 2-3 GM-% IV SOLR: 2.0000 g | Freq: Three times a day (TID) | INTRAVENOUS | Status: DC

## 2015-03-14 MED ORDER — ARTIFICIAL TEARS OP OINT
TOPICAL_OINTMENT | OPHTHALMIC | Status: DC | PRN
  Administered 2015-03-14: 1 via OPHTHALMIC

## 2015-03-14 MED ORDER — MIDAZOLAM HCL 5 MG/ML IJ SOLN
1.0000 mg | Freq: Once | INTRAMUSCULAR | Status: AC
  Administered 2015-03-14: 1 mg via INTRAVENOUS
  Filled 2015-03-14: qty 1

## 2015-03-14 MED ORDER — LACTATED RINGERS IV SOLN
INTRAVENOUS | Status: DC
  Administered 2015-03-14 (×2): via INTRAVENOUS

## 2015-03-14 MED ORDER — ROCURONIUM BROMIDE 50 MG/5ML IV SOLN
INTRAVENOUS | Status: AC
  Filled 2015-03-14: qty 1

## 2015-03-14 MED ORDER — GLYCOPYRROLATE 0.2 MG/ML IJ SOLN
INTRAMUSCULAR | Status: AC
  Filled 2015-03-14: qty 1

## 2015-03-14 MED ORDER — FENTANYL CITRATE (PF) 100 MCG/2ML IJ SOLN
100.0000 ug | Freq: Once | INTRAMUSCULAR | Status: AC
  Administered 2015-03-14: 100 ug via INTRAVENOUS

## 2015-03-14 MED ORDER — 0.9 % SODIUM CHLORIDE (POUR BTL) OPTIME
TOPICAL | Status: DC | PRN
  Administered 2015-03-14: 1000 mL

## 2015-03-14 MED ORDER — DEXAMETHASONE SODIUM PHOSPHATE 4 MG/ML IJ SOLN
INTRAMUSCULAR | Status: DC | PRN
  Administered 2015-03-14: 4 mg via INTRAVENOUS

## 2015-03-14 MED ORDER — SODIUM CHLORIDE 0.9 % IJ SOLN
INTRAMUSCULAR | Status: AC
  Filled 2015-03-14: qty 10

## 2015-03-14 MED ORDER — EPHEDRINE SULFATE 50 MG/ML IJ SOLN
INTRAMUSCULAR | Status: AC
  Filled 2015-03-14: qty 1

## 2015-03-14 MED ORDER — BUPIVACAINE-EPINEPHRINE (PF) 0.5% -1:200000 IJ SOLN
INTRAMUSCULAR | Status: DC | PRN
  Administered 2015-03-14: 30 mL via PERINEURAL

## 2015-03-14 MED ORDER — ONDANSETRON HCL 4 MG/2ML IJ SOLN
INTRAMUSCULAR | Status: DC | PRN
  Administered 2015-03-14: 4 mg via INTRAVENOUS

## 2015-03-14 MED ORDER — ONDANSETRON HCL 4 MG/2ML IJ SOLN
INTRAMUSCULAR | Status: AC
  Filled 2015-03-14: qty 2

## 2015-03-14 MED ORDER — VANCOMYCIN HCL IN DEXTROSE 1-5 GM/200ML-% IV SOLN
1000.0000 mg | INTRAVENOUS | Status: AC
  Administered 2015-03-14: 1000 mg via INTRAVENOUS

## 2015-03-14 SURGICAL SUPPLY — 79 items
BANDAGE ELASTIC 3 VELCRO ST LF (GAUZE/BANDAGES/DRESSINGS) ×3 IMPLANT
BANDAGE ELASTIC 4 VELCRO ST LF (GAUZE/BANDAGES/DRESSINGS) ×3 IMPLANT
BIT DRILL 2.2 SS TIBIAL (BIT) ×3 IMPLANT
BLADE SURG 10 STRL SS (BLADE) ×3 IMPLANT
BNDG ESMARK 4X9 LF (GAUZE/BANDAGES/DRESSINGS) ×3 IMPLANT
BNDG GAUZE ELAST 4 BULKY (GAUZE/BANDAGES/DRESSINGS) IMPLANT
CLOSURE WOUND 1/2 X4 (GAUZE/BANDAGES/DRESSINGS)
CORDS BIPOLAR (ELECTRODE) ×3 IMPLANT
COVER SURGICAL LIGHT HANDLE (MISCELLANEOUS) ×3 IMPLANT
CUFF TOURNIQUET SINGLE 18IN (TOURNIQUET CUFF) ×3 IMPLANT
DRAIN TLS ROUND 10FR (DRAIN) IMPLANT
DRAPE INCISE IOBAN 66X45 STRL (DRAPES) ×3 IMPLANT
DRAPE OEC MINIVIEW 54X84 (DRAPES) ×3 IMPLANT
DRAPE U-SHAPE 47X51 STRL (DRAPES) ×3 IMPLANT
DRSG PAD ABDOMINAL 8X10 ST (GAUZE/BANDAGES/DRESSINGS) ×3 IMPLANT
DURAPREP 26ML APPLICATOR (WOUND CARE) ×3 IMPLANT
ELECT REM PT RETURN 9FT ADLT (ELECTROSURGICAL) ×3
ELECTRODE REM PT RTRN 9FT ADLT (ELECTROSURGICAL) ×1 IMPLANT
FACESHIELD WRAPAROUND (MASK) ×3 IMPLANT
GAUZE SPONGE 4X4 12PLY STRL (GAUZE/BANDAGES/DRESSINGS) IMPLANT
GAUZE XEROFORM 1X8 LF (GAUZE/BANDAGES/DRESSINGS) ×3 IMPLANT
GLOVE BIO SURGEON ST LM GN SZ9 (GLOVE) IMPLANT
GLOVE BIOGEL PI IND STRL 6.5 (GLOVE) ×2 IMPLANT
GLOVE BIOGEL PI IND STRL 7.0 (GLOVE) ×2 IMPLANT
GLOVE BIOGEL PI IND STRL 8 (GLOVE) ×2 IMPLANT
GLOVE BIOGEL PI INDICATOR 6.5 (GLOVE) ×4
GLOVE BIOGEL PI INDICATOR 7.0 (GLOVE) ×4
GLOVE BIOGEL PI INDICATOR 8 (GLOVE) ×4
GLOVE ECLIPSE 7.0 STRL STRAW (GLOVE) ×3 IMPLANT
GLOVE SURG ORTHO 8.0 STRL STRW (GLOVE) ×3 IMPLANT
GLOVE SURG SS PI 6.5 STRL IVOR (GLOVE) ×6 IMPLANT
GOWN STRL REUS W/ TWL LRG LVL3 (GOWN DISPOSABLE) ×3 IMPLANT
GOWN STRL REUS W/ TWL XL LVL3 (GOWN DISPOSABLE) ×1 IMPLANT
GOWN STRL REUS W/TWL LRG LVL3 (GOWN DISPOSABLE) ×6
GOWN STRL REUS W/TWL XL LVL3 (GOWN DISPOSABLE) ×2
K-WIRE 1.6 (WIRE) ×2
K-WIRE FX5X1.6XNS BN SS (WIRE) ×1
KIT BASIN OR (CUSTOM PROCEDURE TRAY) ×3 IMPLANT
KIT ROOM TURNOVER OR (KITS) ×3 IMPLANT
KWIRE FX5X1.6XNS BN SS (WIRE) ×1 IMPLANT
NEEDLE 22X1 1/2 (OR ONLY) (NEEDLE) IMPLANT
NS IRRIG 1000ML POUR BTL (IV SOLUTION) ×3 IMPLANT
PACK ORTHO EXTREMITY (CUSTOM PROCEDURE TRAY) ×3 IMPLANT
PAD ARMBOARD 7.5X6 YLW CONV (MISCELLANEOUS) ×6 IMPLANT
PAD CAST 3X4 CTTN HI CHSV (CAST SUPPLIES) ×1 IMPLANT
PAD CAST 4YDX4 CTTN HI CHSV (CAST SUPPLIES) ×1 IMPLANT
PADDING CAST COTTON 3X4 STRL (CAST SUPPLIES) ×2
PADDING CAST COTTON 4X4 STRL (CAST SUPPLIES) ×2
PEG LOCKING SMOOTH 2.2X16 (Screw) ×3 IMPLANT
PEG LOCKING SMOOTH 2.2X18 (Peg) ×6 IMPLANT
PEG LOCKING SMOOTH 2.2X20 (Screw) ×6 IMPLANT
PEG LOCKING SMOOTH 2.2X22 (Screw) ×3 IMPLANT
PEG LOCKING SMOOTH 2.2X24 (Peg) ×3 IMPLANT
PLATE NARROW DVR LEFT (Plate) ×3 IMPLANT
SCREW LOCK 14X2.7X 3 LD TPR (Screw) ×3 IMPLANT
SCREW LOCK 16X2.7X 3 LD TPR (Screw) ×1 IMPLANT
SCREW LOCKING 2.7X14 (Screw) ×6 IMPLANT
SCREW LOCKING 2.7X15MM (Screw) ×3 IMPLANT
SCREW LOCKING 2.7X16 (Screw) ×2 IMPLANT
SCREW LP NL 2.7X22MM (Screw) ×3 IMPLANT
SLING ARM MED ADULT FOAM STRAP (SOFTGOODS) ×3 IMPLANT
SPONGE GAUZE 4X4 12PLY STER LF (GAUZE/BANDAGES/DRESSINGS) ×3 IMPLANT
SPONGE LAP 4X18 X RAY DECT (DISPOSABLE) ×3 IMPLANT
STAPLER VISISTAT 35W (STAPLE) IMPLANT
STOCKINETTE IMPERVIOUS LG (DRAPES) ×3 IMPLANT
STRIP CLOSURE SKIN 1/2X4 (GAUZE/BANDAGES/DRESSINGS) IMPLANT
SUCTION FRAZIER TIP 10 FR DISP (SUCTIONS) ×3 IMPLANT
SUT ETHILON 3 0 PS 1 (SUTURE) ×6 IMPLANT
SUT PROLENE 3 0 PS 1 (SUTURE) IMPLANT
SUT VIC AB 2-0 CTB1 (SUTURE) IMPLANT
SUT VIC AB 3-0 X1 27 (SUTURE) ×3 IMPLANT
SYR CONTROL 10ML LL (SYRINGE) IMPLANT
SYSTEM CHEST DRAIN TLS 7FR (DRAIN) IMPLANT
TOWEL OR 17X24 6PK STRL BLUE (TOWEL DISPOSABLE) ×3 IMPLANT
TOWEL OR 17X26 10 PK STRL BLUE (TOWEL DISPOSABLE) ×3 IMPLANT
TUBE CONNECTING 12'X1/4 (SUCTIONS) ×1
TUBE CONNECTING 12X1/4 (SUCTIONS) ×2 IMPLANT
WATER STERILE IRR 1000ML POUR (IV SOLUTION) ×3 IMPLANT
YANKAUER SUCT BULB TIP NO VENT (SUCTIONS) ×3 IMPLANT

## 2015-03-14 NOTE — Brief Op Note (Signed)
03/14/2015  12:22 PM  PATIENT:  Luan PullingBrenda H Lohnes  69 y.o. female  PRE-OPERATIVE DIAGNOSIS:  left distal radius fracture  POST-OPERATIVE DIAGNOSIS:  left distal radius fracture  PROCEDURE:  Procedure(s): OPEN REDUCTION INTERNAL FIXATION (ORIF) LEFT DISTAL RADIUS FRACTURE  SURGEON:  Surgeon(s): Cammy CopaScott Gregory Dean, MD  ASSISTANT: carla bethune rnfa   ANESTHESIA:   general  EBL: 15 ml    Total I/O In: 800 [I.V.:800] Out: 50 [Blood:50]  BLOOD ADMINISTERED: none  DRAINS: none   LOCAL MEDICATIONS USED:  none  SPECIMEN:  No Specimen  COUNTS:  YES  TOURNIQUET:   Total Tourniquet Time Documented: Upper Arm (Left) - -10 minutes Total: Upper Arm (Left) - -10 minutes   DICTATION: .Other Dictation: Dictation Number [id number]  PLAN OF CARE: Discharge to home after PACU  PATIENT DISPOSITION:  PACU - hemodynamically stable

## 2015-03-14 NOTE — Anesthesia Procedure Notes (Addendum)
Anesthesia Regional Block:  Supraclavicular block  Pre-Anesthetic Checklist: ,, timeout performed, Correct Patient, Correct Site, Correct Laterality, Correct Procedure, Correct Position, site marked, Risks and benefits discussed, pre-op evaluation, post-op pain management  Laterality: Left  Prep: Maximum Sterile Barrier Precautions used and chloraprep       Needles:  Injection technique: Single-shot  Needle Type: Echogenic Stimulator Needle     Needle Length: 5cm 5 cm Needle Gauge: 22 and 22 G    Additional Needles:  Procedures: ultrasound guided (picture in chart) Supraclavicular block Narrative:  Start time: 03/14/2015 9:30 AM End time: 03/14/2015 9:39 AM Injection made incrementally with aspirations every 5 mL. Anesthesiologist: Gaynelle AduFITZGERALD, WILLIAM  Additional Notes: 2% Lidocaine skin wheel.    Procedure Name: LMA Insertion Date/Time: 03/14/2015 10:40 AM Performed by: Fransisca KaufmannMEYER, Toshiye Kever E Pre-anesthesia Checklist: Patient identified, Emergency Drugs available, Suction available, Patient being monitored and Timeout performed Patient Re-evaluated:Patient Re-evaluated prior to inductionOxygen Delivery Method: Circle system utilized Intubation Type: IV induction Ventilation: Mask ventilation without difficulty LMA: LMA inserted LMA Size: 4.0 Number of attempts: 1 Placement Confirmation: positive ETCO2 and breath sounds checked- equal and bilateral Tube secured with: Tape Dental Injury: Teeth and Oropharynx as per pre-operative assessment

## 2015-03-14 NOTE — Anesthesia Postprocedure Evaluation (Signed)
  Anesthesia Post-op Note  Patient: Jade Walker  Procedure(s) Performed: Procedure(s): OPEN REDUCTION INTERNAL FIXATION (ORIF) LEFT DISTAL RADIUS FRACTURE (Left)  Patient Location: PACU  Anesthesia Type:General and block  Level of Consciousness: awake and alert   Airway and Oxygen Therapy: Patient Spontanous Breathing  Post-op Pain: Controlled  Post-op Assessment: Post-op Vital signs reviewed, Patient's Cardiovascular Status Stable and Respiratory Function Stable  Post-op Vital Signs: Reviewed  Filed Vitals:   03/14/15 1400  BP: 164/83  Pulse: 65  Temp:   Resp:     Complications: No apparent anesthesia complications

## 2015-03-14 NOTE — Anesthesia Preprocedure Evaluation (Addendum)
Anesthesia Evaluation  Patient identified by MRN, date of birth, ID band Patient awake    Reviewed: Allergy & Precautions, H&P , NPO status , Patient's Chart, lab work & pertinent test results  Airway Mallampati: II  TM Distance: >3 FB Neck ROM: Full    Dental no notable dental hx. (+) Upper Dentures, Lower Dentures, Dental Advisory Given   Pulmonary asthma ,  breath sounds clear to auscultation  Pulmonary exam normal       Cardiovascular hypertension, Pt. on medications Rhythm:Regular Rate:Normal     Neuro/Psych Anxiety negative neurological ROS  negative psych ROS   GI/Hepatic Neg liver ROS, hiatal hernia, GERD-  Medicated and Controlled,  Endo/Other  Hypothyroidism   Renal/GU Renal disease  negative genitourinary   Musculoskeletal  (+) Arthritis -, Osteoarthritis,    Abdominal   Peds  Hematology negative hematology ROS (+)   Anesthesia Other Findings   Reproductive/Obstetrics negative OB ROS                            Anesthesia Physical Anesthesia Plan  ASA: II  Anesthesia Plan: General and Regional   Post-op Pain Management:    Induction: Intravenous  Airway Management Planned: LMA  Additional Equipment:   Intra-op Plan:   Post-operative Plan: Extubation in OR  Informed Consent: I have reviewed the patients History and Physical, chart, labs and discussed the procedure including the risks, benefits and alternatives for the proposed anesthesia with the patient or authorized representative who has indicated his/her understanding and acceptance.   Dental advisory given  Plan Discussed with: CRNA  Anesthesia Plan Comments:         Anesthesia Quick Evaluation

## 2015-03-14 NOTE — Transfer of Care (Signed)
Immediate Anesthesia Transfer of Care Note  Patient: Jade Walker  Procedure(s) Performed: Procedure(s): OPEN REDUCTION INTERNAL FIXATION (ORIF) LEFT DISTAL RADIUS FRACTURE (Left)  Patient Location: PACU  Anesthesia Type:General and Regional  Level of Consciousness: awake, alert , oriented and sedated  Airway & Oxygen Therapy: Patient Spontanous Breathing and Patient connected to nasal cannula oxygen  Post-op Assessment: Report given to RN, Post -op Vital signs reviewed and stable and Patient moving all extremities  Post vital signs: Reviewed and stable  Last Vitals:  Filed Vitals:   03/14/15 1015  BP:   Pulse: 78  Temp:   Resp: 24    Complications: No apparent anesthesia complications

## 2015-03-14 NOTE — H&P (Signed)
Jade PullingBrenda H Walker is an 69 y.o. female.   Chief Complaint: Left wrist pain HPI: Jade DroneBrenda 69 year old female with left wrist pain she was scheduled for total knee replacement today but she fell on Friday and sustained a displaced left wrist fracture. She would like to undergo knee replacement as soon as possible and thus presents for elective wrist fixation in order to facilitate quicker recovery so that she can have her knee replacement sooner rather than later due to furniture market concerns. She denies any other orthopedic complaints. She was splinted.  Past Medical History  Diagnosis Date  . GERD (gastroesophageal reflux disease)   . Anxiety   . Stress incontinence     from Care Everywhere  . Iron deficiency anemia     from Care Everywhere  . History of hiatal hernia   . Hypertension   . Asthma   . Pneumonia   . Osteoporosis   . Hypothyroidism   . Kidney stones   . Arthritis     DDD - lumbar (from Care Everywhere), osteoarthritis  . Full dentures   . Environmental and seasonal allergies   . Complication of anesthesia     blood loss after back surgery because hernia was scratched and "torn" during intubation (02/15/11 EGD showed GIB d/t esophageal ulcer)    Past Surgical History  Procedure Laterality Date  . Eye surgery Bilateral     cataract surgery with lens implant  . Colonoscopy    . Tonsillectomy    . Cholecystectomy    . Abdominal hysterectomy    . Back surgery      lumbar x 3, 1 thoracic spine    Family History  Problem Relation Age of Onset  . CAD Mother   . Emphysema Father   . Alcoholism Father    Social History:  reports that she has never smoked. She has never used smokeless tobacco. She reports that she does not drink alcohol or use illicit drugs.  Allergies:  Allergies  Allergen Reactions  . Codeine Other (See Comments)    Large doses make her sweat, heart flutter  . Sulfamethoxazole-Trimethoprim Rash    No prescriptions prior to admission    No  results found for this or any previous visit (from the past 48 hour(s)). No results found.  Review of Systems  Constitutional: Negative.   HENT: Negative.   Eyes: Negative.   Respiratory: Negative.   Cardiovascular: Negative.   Gastrointestinal: Negative.   Genitourinary: Negative.   Musculoskeletal: Positive for joint pain.  Skin: Negative.   Neurological: Negative.   Endo/Heme/Allergies: Negative.   Psychiatric/Behavioral: Negative.     There were no vitals taken for this visit. Physical Exam  Constitutional: She appears well-developed.  HENT:  Head: Normocephalic.  Eyes: Pupils are equal, round, and reactive to light.  Neck: Normal range of motion.  Cardiovascular: Normal rate.   Respiratory: Effort normal.  Neurological: She is alert.  Skin: Skin is warm.  Psychiatric: She has a normal mood and affect.   examination the left wrist demonstrates that the wrist is splinted perfused and sensate fingers are mobile elbow range of motion is intact   Assessment/Plan Impression is left distal radius fracture with slight dorsal angulation and loss of height. Plan open reduction internal fixation of the left wrist. Risk and benefits discussed with patient we will and to infection or vessel damage wrist stiffness potential for hardware removal small potential for tendon rupture in general the case is being done to facilitate a quicker recovery  in a more functional recovery so that she can weight-bear on the left wrist as part of her recovery for total knee replacement. Patient understands risk benefits and wished proceed all questions answered  Olon Russ SCOTT 03/14/2015, 7:26 AM

## 2015-03-15 ENCOUNTER — Encounter (HOSPITAL_COMMUNITY): Payer: Self-pay | Admitting: Orthopedic Surgery

## 2015-03-15 NOTE — Op Note (Signed)
NAME:  Jade Walker, Jade Walker             ACCOUNT NO.:  [id number]  MEDICAL RECORD NO.:  [id number]  LOCATION:  [facility]                         FACILITY:  [facility]  PHYSICIAN:  Burnard BuntingG. Scott Dean, M.D.    DATE OF BIRTH:  [birth year]  DATE OF PROCEDURE:  03/14/2015 DATE OF DISCHARGE:  03/14/2015                              OPERATIVE REPORT   PREOPERATIVE DIAGNOSIS:  Left distal radius fracture, two-part.  POSTOPERATIVE DIAGNOSIS:  Left distal radius fracture, two-part.  PROCEDURE:  Left distal radius fracture open reduction and internal fixation.  SURGEON:  Burnard BuntingG. Scott Dean, M.D.  ASSISTANT:  Patrick Jupiterarla Bethune, RNFA.  INDICATIONS:  Jade SantiagoBrenda Bloomquist is a 69 year old female, left distal radius fracture, presents for operative management after explanation of risks and benefits of total knee replacement pending in the near future as well.  PROCEDURE IN DETAIL:  The patient was brought to the operating room where general anesthetic was induced.  Preoperative antibiotics were administered.  Time-out was called.  Left wrist was prescrubbed with alcohol and Betadine, allowed to air dry, prepped with DuraPrep solution and draped in a sterile manner.  Collier Flowersoban was used to cover the operative field.  The arm was elevated and exsanguinated with Esmarch wrap. Tourniquet was inflated.  Time-out was called.  Incision was made from the proximal wrist flexion crease distally along the FCR tendon.  FCR tendon sheath was opened.  Tendon was mobilized radially.  Final portion of the sheath was incised.  Pronator quadratus was visualized.  It was detached from the radial aspect of the distal radius.  The fracture was identified.  The plate was applied, fracture was reduced.  Plate utilized was a narrow AcuMed plate.  Correct screw placement confirmed in the AP and lateral planes under fluoroscopy.  Nonlocking screw initially was utilized to pull the bone to the plate to restore volar tilt and height.  Once this was done, the  pegs were placed with confirmation of correct placement in the AP and lateral planes.  A combination of locking and nonlocking screws were placed proximally. Bone quality was marginal with good fixation was achieved.  Wrist was taken through range of motion and found to have good range of motion. Irrigation was then utilized.  Tourniquet was released.  Bleeding points were encountered and controlled with bipolar electrocautery.  Incision was then closed using 3-0 Vicryl, 3-0 nylon.  Bulky, well-padded splint applied.  The patient tolerated the procedure well without immediate complication.  Transferred to the recovery room in stable condition.    Burnard BuntingG. Scott Dean, M.D.    GSD/MEDQ  D:  03/14/2015  T:  03/15/2015  Job:  [id number]

## 2015-03-29 ENCOUNTER — Other Ambulatory Visit (HOSPITAL_COMMUNITY): Payer: Self-pay | Admitting: Orthopedic Surgery

## 2015-04-04 ENCOUNTER — Other Ambulatory Visit (HOSPITAL_COMMUNITY): Payer: Self-pay | Admitting: Orthopedic Surgery

## 2015-04-06 NOTE — Pre-Procedure Instructions (Addendum)
Jade Walker  04/06/2015      [id number] - [place], [place] - [address] [place] [address] Phone: [phone] Fax: [phone]    Your procedure is scheduled on Tuesday, April 18, 2015.  Report to [facility] Admitting at 9:15 A.M.  Call this number if you have problems the morning of surgery:  [phone]   Remember:  Do not eat food or drink liquids after midnight Monday, April 17, 2015  Take these medicines the morning of surgery with A SIP OF WATER :, citalopram (CELEXA), levothyroxine (SYNTHROID, LEVOTHROID), omeprazole (PRILOSEC),  Fluticasone-Salmeterol (ADVAIR)if needed  Stop taking Diethylpropion HCl CR now.  Stop taking Aspirin, all vitamins, and herbal medications. Do not take any NSAIDs ie: Ibuprofen, Advil, Naproxen or any medication containing Aspirin such as meloxicam (MOBIC)  ; stop 04/12/15    Do not wear jewelry, make-up or nail polish.  Do not wear lotions, powders, or perfumes.  You may not wear deodorant.  Do not shave 48 hours prior to surgery.  Men may shave face and neck.  Do not bring valuables to the hospital.  [facility] is not responsible for any belongings or valuables.  Contacts, dentures or bridgework may not be worn into surgery.  Leave your suitcase in the car.  After surgery it may be brought to your room.  For patients admitted to the hospital, discharge time will be determined by your treatment team.  Patients discharged the day of surgery will not be allowed to drive home.   Name and phone number of your driver:    Special instructions:  Special Instructions:Special Instructions: [facility] - Preparing for Surgery  Before surgery, you can play an important role.  Because skin is not sterile, your skin needs to be as free of germs as possible.  You can reduce the number of germs on you skin by washing with CHG (chlorahexidine gluconate) soap before surgery.   CHG is an antiseptic cleaner which kills germs and bonds with the skin to continue killing germs even after washing.  Please DO NOT use if you have an allergy to CHG or antibacterial soaps.  If your skin becomes reddened/irritated stop using the CHG and inform your nurse when you arrive at Short Stay.  Do not shave (including legs and underarms) for at least 48 hours prior to the first CHG shower.  You may shave your face.  Please follow these instructions carefully:   1.  Shower with CHG Soap the night before surgery and the morning of Surgery.  2.  If you choose to wash your hair, wash your hair first as usual with your normal shampoo.  3.  After you shampoo, rinse your hair and body thoroughly to remove the Shampoo.  4.  Use CHG as you would any other liquid soap.  You can apply chg directly  to the skin and wash gently with scrungie or a clean washcloth.  5.  Apply the CHG Soap to your body ONLY FROM THE NECK DOWN.  Do not use on open wounds or open sores.  Avoid contact with your eyes, ears, mouth and genitals (private parts).  Wash genitals (private parts) with your normal soap.  6.  Wash thoroughly, paying special attention to the area where your surgery will be performed.  7.  Thoroughly rinse your body with warm water from the neck down.  8.  DO NOT shower/wash with  your normal soap after using and rinsing off the CHG Soap.  9.  Pat yourself dry with a clean towel.            10.  Wear clean pajamas.            11.  Place clean sheets on your bed the night of your first shower and do not sleep with pets.  Day of Surgery  Do not apply any lotions/deodorants the morning of surgery.  Please wear clean clothes to the hospital/surgery center.  Please read over the following fact sheets that you were given. Pain Booklet, Coughing and Deep Breathing, Blood Transfusion Information, Total Joint Packet, MRSA Information and Surgical Site Infection Prevention

## 2015-04-07 ENCOUNTER — Encounter (HOSPITAL_COMMUNITY): Payer: Self-pay

## 2015-04-07 ENCOUNTER — Encounter (HOSPITAL_COMMUNITY)
Admission: RE | Admit: 2015-04-07 | Discharge: 2015-04-07 | Disposition: A | Discharge status: Home / Self Care | Payer: Medicare HMO | Source: Ambulatory Visit | Attending: Orthopedic Surgery | Admitting: Orthopedic Surgery

## 2015-04-07 DIAGNOSIS — Z0183 Encounter for blood typing: Secondary | ICD-10-CM | POA: Insufficient documentation

## 2015-04-07 DIAGNOSIS — Z01812 Encounter for preprocedural laboratory examination: Secondary | ICD-10-CM | POA: Diagnosis present

## 2015-04-07 DIAGNOSIS — M179 Osteoarthritis of knee, unspecified: Secondary | ICD-10-CM | POA: Insufficient documentation

## 2015-04-07 HISTORY — DX: Headache, unspecified: R51.9

## 2015-04-07 HISTORY — DX: Headache: R51

## 2015-04-07 HISTORY — DX: Reserved for inherently not codable concepts without codable children: IMO0001

## 2015-04-07 HISTORY — DX: Personal history of urinary calculi: Z87.442

## 2015-04-07 LAB — BASIC METABOLIC PANEL
ANION GAP: 10 (ref 5–15)
BUN: 17 mg/dL (ref 6–20)
CO2: 27 mmol/L (ref 22–32)
Calcium: 9 mg/dL (ref 8.9–10.3)
Chloride: 98 mmol/L — ABNORMAL LOW (ref 101–111)
Creatinine, Ser: 1.8 mg/dL — ABNORMAL HIGH (ref 0.44–1.00)
GFR calc non Af Amer: 28 mL/min — ABNORMAL LOW (ref >60)
GFR, EST AFRICAN AMERICAN: 32 mL/min — AB (ref >60)
GLUCOSE: 82 mg/dL (ref 65–99)
Potassium: 3.1 mmol/L — ABNORMAL LOW (ref 3.5–5.1)
SODIUM: 135 mmol/L (ref 135–145)

## 2015-04-07 LAB — CBC
HEMATOCRIT: 36.1 % (ref 36.0–46.0)
Hemoglobin: 11.8 g/dL — ABNORMAL LOW (ref 12.0–15.0)
MCH: 29.9 pg (ref 26.0–34.0)
MCHC: 32.7 g/dL (ref 30.0–36.0)
MCV: 91.4 fL (ref 78.0–100.0)
PLATELETS: 339 10*3/uL (ref 150–400)
RBC: 3.95 MIL/uL (ref 3.87–5.11)
RDW: 12.6 % (ref 11.5–15.5)
WBC: 8.4 10*3/uL (ref 4.0–10.5)

## 2015-04-07 LAB — URINALYSIS, ROUTINE W REFLEX MICROSCOPIC
Bilirubin Urine: NEGATIVE
GLUCOSE, UA: NEGATIVE mg/dL
Ketones, ur: NEGATIVE mg/dL
LEUKOCYTES UA: NEGATIVE
NITRITE: NEGATIVE
PROTEIN: NEGATIVE mg/dL
Specific Gravity, Urine: 1.011 (ref 1.005–1.030)
Urobilinogen, UA: 0.2 mg/dL (ref 0.0–1.0)
pH: 5.5 (ref 5.0–8.0)

## 2015-04-07 LAB — TYPE AND SCREEN
ABO/RH(D): A POS
Antibody Screen: NEGATIVE

## 2015-04-07 LAB — SURGICAL PCR SCREEN
MRSA, PCR: POSITIVE — AB
Staphylococcus aureus: POSITIVE — AB

## 2015-04-07 LAB — URINE MICROSCOPIC-ADD ON

## 2015-04-08 LAB — URINE CULTURE: Culture: 6000

## 2015-04-10 NOTE — Progress Notes (Signed)
Anesthesia Chart Review:  Pt is 69 year old female scheduled for L total knee arthroplasty on 04/18/2015 with Dr. August Saucer.   History includes non-smoker, GERD, anxiety, stress incontinence, iron deficiency anemia, hiatal hernia repair , HTN, asthma, hypothyroidism, nephrolithiasis, asthma, hysterectomy, tonsillectomy, cholecystectomy, decompressive laminectomy L4-3 with PLIF 02/12/11 complicated by post-operative hematemesis that evening (EGD showed esophageal ulcer). S/p ORIF radial fracture 03/14/15. Never smoker. BMI is 27 consistent with mild obesity.  PCP is Daphane Shepherd, PA-C (see Care Everywhere). Office visit from 02/10/15 indicates that PCP was aware that patient was in need for right shoulder surgery and left TKA.   02/13/11 EGD: IMPRESSION: Adherent blood clot in the esophagus, possibly overlying distal ulcer or Mallory-Weiss tear. F/U 02/15/11 EGD: IMPRESSION: 1. 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 2. Large hiatal hernia.  Meds include albuterol, Zyrtec, Celexa, Advair, levothyroxine, Mobic, Prilosec, Zanaflex, percocet, diethylpropion (sympathomimetic used for weight loss). Pt was instructed to stop diethyproprion 04/06/15.   Preoperative labs reviewed.  Cr 1.8. Most recent comparison labs show cr of 1.42 and 1.6.   03/03/15 EKG: NSR, poor r wave progression. No significant change since 02/12/11 tracing.  10/30/10 Stress echo (HPR) (found in  correspondence dated 02/17/2011 in media tab): Poor exercise tolerance. Low normal resting LV systolic function 55%. Hypertensive resting BP with normal response. LV function at peak exercise in not hyperdynamic but there are no focal wall motion abnormalities. Negative exercise treadmill test. Equivocal stress echo with no focal wall motion abnormality post exercise, but without hyperdynamic response.   03/03/15 CXR: Moderate-sized hiatal hernia. Bronchitic changes and hyperinflation consistent with COPD.  Reviewed cr results with  Dr. Jacklynn Bue.   If no changes, I anticipate pt can proceed with surgery as scheduled.   Rica Mast, FNP-BC [facility] Short Stay Surgical Center/Anesthesiology Phone: [phone] 04/10/2015 4:43 PM

## 2015-04-11 ENCOUNTER — Other Ambulatory Visit (HOSPITAL_COMMUNITY): Payer: Self-pay | Admitting: Orthopedic Surgery

## 2015-04-18 ENCOUNTER — Inpatient Hospital Stay (HOSPITAL_COMMUNITY)
Admission: RE | Admit: 2015-04-18 | Discharge: 2015-04-20 | DRG: 470 | Disposition: A | Discharge status: Home Health | Payer: Medicare HMO | Source: Ambulatory Visit | Attending: Orthopedic Surgery | Admitting: Orthopedic Surgery

## 2015-04-18 ENCOUNTER — Encounter (HOSPITAL_COMMUNITY): Payer: Self-pay | Admitting: *Deleted

## 2015-04-18 ENCOUNTER — Inpatient Hospital Stay (HOSPITAL_COMMUNITY): Payer: Medicare HMO | Admitting: Emergency Medicine

## 2015-04-18 ENCOUNTER — Inpatient Hospital Stay (HOSPITAL_COMMUNITY): Payer: Medicare HMO | Admitting: Anesthesiology

## 2015-04-18 ENCOUNTER — Encounter (HOSPITAL_COMMUNITY)
Admission: RE | Disposition: A | Discharge status: Home Health | Payer: Self-pay | Source: Ambulatory Visit | Attending: Orthopedic Surgery

## 2015-04-18 DIAGNOSIS — Z825 Family history of asthma and other chronic lower respiratory diseases: Secondary | ICD-10-CM

## 2015-04-18 DIAGNOSIS — Z791 Long term (current) use of non-steroidal anti-inflammatories (NSAID): Secondary | ICD-10-CM | POA: Diagnosis not present

## 2015-04-18 DIAGNOSIS — M1710 Unilateral primary osteoarthritis, unspecified knee: Secondary | ICD-10-CM | POA: Diagnosis not present

## 2015-04-18 DIAGNOSIS — N183 Chronic kidney disease, stage 3 (moderate): Secondary | ICD-10-CM | POA: Diagnosis present

## 2015-04-18 DIAGNOSIS — I471 Supraventricular tachycardia: Secondary | ICD-10-CM | POA: Diagnosis not present

## 2015-04-18 DIAGNOSIS — I129 Hypertensive chronic kidney disease with stage 1 through stage 4 chronic kidney disease, or unspecified chronic kidney disease: Secondary | ICD-10-CM | POA: Diagnosis present

## 2015-04-18 DIAGNOSIS — I10 Essential (primary) hypertension: Secondary | ICD-10-CM

## 2015-04-18 DIAGNOSIS — M25562 Pain in left knee: Secondary | ICD-10-CM | POA: Diagnosis present

## 2015-04-18 DIAGNOSIS — R000 Tachycardia, unspecified: Secondary | ICD-10-CM | POA: Diagnosis not present

## 2015-04-18 DIAGNOSIS — N393 Stress incontinence (female) (male): Secondary | ICD-10-CM | POA: Diagnosis present

## 2015-04-18 DIAGNOSIS — E039 Hypothyroidism, unspecified: Secondary | ICD-10-CM | POA: Diagnosis present

## 2015-04-18 DIAGNOSIS — K449 Diaphragmatic hernia without obstruction or gangrene: Secondary | ICD-10-CM | POA: Diagnosis present

## 2015-04-18 DIAGNOSIS — Z811 Family history of alcohol abuse and dependence: Secondary | ICD-10-CM | POA: Diagnosis not present

## 2015-04-18 DIAGNOSIS — M179 Osteoarthritis of knee, unspecified: Secondary | ICD-10-CM | POA: Diagnosis present

## 2015-04-18 DIAGNOSIS — M1712 Unilateral primary osteoarthritis, left knee: Principal | ICD-10-CM | POA: Diagnosis present

## 2015-04-18 DIAGNOSIS — D509 Iron deficiency anemia, unspecified: Secondary | ICD-10-CM | POA: Diagnosis present

## 2015-04-18 DIAGNOSIS — J45909 Unspecified asthma, uncomplicated: Secondary | ICD-10-CM | POA: Diagnosis present

## 2015-04-18 DIAGNOSIS — D649 Anemia, unspecified: Secondary | ICD-10-CM | POA: Diagnosis not present

## 2015-04-18 DIAGNOSIS — Z8249 Family history of ischemic heart disease and other diseases of the circulatory system: Secondary | ICD-10-CM

## 2015-04-18 DIAGNOSIS — M810 Age-related osteoporosis without current pathological fracture: Secondary | ICD-10-CM | POA: Diagnosis present

## 2015-04-18 DIAGNOSIS — K219 Gastro-esophageal reflux disease without esophagitis: Secondary | ICD-10-CM | POA: Diagnosis present

## 2015-04-18 HISTORY — PX: TOTAL KNEE ARTHROPLASTY: SHX125

## 2015-04-18 LAB — POCT I-STAT, CHEM 8
BUN: 29 mg/dL — ABNORMAL HIGH (ref 6–20)
CALCIUM ION: 1.01 mmol/L — AB (ref 1.13–1.30)
Chloride: 99 mmol/L — ABNORMAL LOW (ref 101–111)
Creatinine, Ser: 1.7 mg/dL — ABNORMAL HIGH (ref 0.44–1.00)
GLUCOSE: 85 mg/dL (ref 65–99)
HEMATOCRIT: 35 % — AB (ref 36.0–46.0)
Hemoglobin: 11.9 g/dL — ABNORMAL LOW (ref 12.0–15.0)
Potassium: 4.5 mmol/L (ref 3.5–5.1)
Sodium: 135 mmol/L (ref 135–145)
TCO2: 26 mmol/L (ref 0–100)

## 2015-04-18 LAB — POCT I-STAT 4, (NA,K, GLUC, HGB,HCT)
Glucose, Bld: 86 mg/dL (ref 65–99)
HCT: 36 % (ref 36.0–46.0)
HEMOGLOBIN: 12.2 g/dL (ref 12.0–15.0)
Potassium: 4.4 mmol/L (ref 3.5–5.1)
Sodium: 136 mmol/L (ref 135–145)

## 2015-04-18 SURGERY — ARTHROPLASTY, KNEE, TOTAL
Anesthesia: Regional | Site: Knee | Laterality: Left

## 2015-04-18 MED ORDER — BUPIVACAINE-EPINEPHRINE (PF) 0.5% -1:200000 IJ SOLN
INTRAMUSCULAR | Status: AC
  Filled 2015-04-18: qty 30

## 2015-04-18 MED ORDER — NEOSTIGMINE METHYLSULFATE 10 MG/10ML IV SOLN
INTRAVENOUS | Status: DC | PRN
  Administered 2015-04-18: 4 mg via INTRAVENOUS

## 2015-04-18 MED ORDER — VANCOMYCIN HCL IN DEXTROSE 1-5 GM/200ML-% IV SOLN
1000.0000 mg | INTRAVENOUS | Status: AC
  Administered 2015-04-19: 1000 mg via INTRAVENOUS

## 2015-04-18 MED ORDER — ALBUTEROL SULFATE (2.5 MG/3ML) 0.083% IN NEBU: 2.5000 mg | INHALATION_SOLUTION | Freq: Four times a day (QID) | RESPIRATORY_TRACT | Status: DC | PRN

## 2015-04-18 MED ORDER — VITAMIN D (ERGOCALCIFEROL) 1.25 MG (50000 UNIT) PO CAPS: 50000.0000 [IU] | ORAL_CAPSULE | ORAL | Status: DC

## 2015-04-18 MED ORDER — DM-GUAIFENESIN ER 30-600 MG PO TB12: 1.0000 | ORAL_TABLET | Freq: Two times a day (BID) | ORAL | Status: DC | PRN

## 2015-04-18 MED ORDER — ROCURONIUM BROMIDE 100 MG/10ML IV SOLN
INTRAVENOUS | Status: DC | PRN
  Administered 2015-04-18: 50 mg via INTRAVENOUS

## 2015-04-18 MED ORDER — TRANEXAMIC ACID 1000 MG/10ML IV SOLN
2000.0000 mg | Freq: Once | INTRAVENOUS | Status: DC
  Filled 2015-04-18: qty 20

## 2015-04-18 MED ORDER — GLYCOPYRROLATE 0.2 MG/ML IJ SOLN
INTRAMUSCULAR | Status: DC | PRN
  Administered 2015-04-18: 0.2 mg via INTRAVENOUS
  Administered 2015-04-18: 0.6 mg via INTRAVENOUS

## 2015-04-18 MED ORDER — VANCOMYCIN HCL IN DEXTROSE 1-5 GM/200ML-% IV SOLN
1000.0000 mg | Freq: Two times a day (BID) | INTRAVENOUS | Status: AC
  Administered 2015-04-18: 1000 mg via INTRAVENOUS
  Filled 2015-04-18 (×2): qty 200

## 2015-04-18 MED ORDER — LOSARTAN POTASSIUM-HCTZ 50-12.5 MG PO TABS: 1.0000 | ORAL_TABLET | Freq: Every day | ORAL | Status: DC

## 2015-04-18 MED ORDER — DIETHYLPROPION HCL ER 75 MG PO TB24: 75.0000 mg | ORAL_TABLET | Freq: Every day | ORAL | Status: DC

## 2015-04-18 MED ORDER — HYDROMORPHONE HCL 1 MG/ML IJ SOLN
INTRAMUSCULAR | Status: AC
  Filled 2015-04-18: qty 1

## 2015-04-18 MED ORDER — VANCOMYCIN HCL IN DEXTROSE 1-5 GM/200ML-% IV SOLN
INTRAVENOUS | Status: AC
  Administered 2015-04-18: 1000 mg via INTRAVENOUS
  Filled 2015-04-18: qty 200

## 2015-04-18 MED ORDER — LOSARTAN POTASSIUM 50 MG PO TABS
50.0000 mg | ORAL_TABLET | Freq: Every day | ORAL | Status: DC
  Administered 2015-04-19 – 2015-04-20 (×2): 50 mg via ORAL
  Filled 2015-04-18 (×3): qty 1

## 2015-04-18 MED ORDER — BUPIVACAINE-EPINEPHRINE 0.5% -1:200000 IJ SOLN: INTRAMUSCULAR | Status: DC | PRN

## 2015-04-18 MED ORDER — MIDAZOLAM HCL 2 MG/2ML IJ SOLN
INTRAMUSCULAR | Status: AC
  Filled 2015-04-18: qty 4

## 2015-04-18 MED ORDER — VANCOMYCIN HCL 1000 MG IV SOLR
1000.0000 mg | INTRAVENOUS | Status: DC | PRN
  Administered 2015-04-18: 1000 mg via INTRAVENOUS

## 2015-04-18 MED ORDER — LORATADINE 10 MG PO TABS
10.0000 mg | ORAL_TABLET | Freq: Every day | ORAL | Status: DC
  Administered 2015-04-19 – 2015-04-20 (×2): 10 mg via ORAL
  Filled 2015-04-18 (×3): qty 1

## 2015-04-18 MED ORDER — MORPHINE SULFATE 4 MG/ML IJ SOLN
INTRAMUSCULAR | Status: AC
  Filled 2015-04-18: qty 2

## 2015-04-18 MED ORDER — DOCUSATE SODIUM 100 MG PO CAPS
100.0000 mg | ORAL_CAPSULE | Freq: Two times a day (BID) | ORAL | Status: DC
  Administered 2015-04-18: 100 mg via ORAL
  Filled 2015-04-18 (×4): qty 1

## 2015-04-18 MED ORDER — MENTHOL 3 MG MT LOZG: 1.0000 | LOZENGE | OROMUCOSAL | Status: DC | PRN

## 2015-04-18 MED ORDER — TIZANIDINE HCL 4 MG PO TABS: 4.0000 mg | ORAL_TABLET | Freq: Three times a day (TID) | ORAL | Status: DC | PRN

## 2015-04-18 MED ORDER — FENTANYL CITRATE (PF) 250 MCG/5ML IJ SOLN
INTRAMUSCULAR | Status: AC
  Filled 2015-04-18: qty 5

## 2015-04-18 MED ORDER — VANCOMYCIN HCL IN DEXTROSE 1-5 GM/200ML-% IV SOLN
1000.0000 mg | Freq: Once | INTRAVENOUS | Status: AC
  Administered 2015-04-18: 1000 mg via INTRAVENOUS
  Filled 2015-04-18: qty 200

## 2015-04-18 MED ORDER — TRANEXAMIC ACID 1000 MG/10ML IV SOLN
2000.0000 mg | INTRAVENOUS | Status: DC | PRN
  Administered 2015-04-18: 2000 mg via TOPICAL

## 2015-04-18 MED ORDER — SODIUM CHLORIDE 0.9 % IR SOLN
Status: DC | PRN
  Administered 2015-04-18: 3000 mL

## 2015-04-18 MED ORDER — MOMETASONE FURO-FORMOTEROL FUM 100-5 MCG/ACT IN AERO
2.0000 | INHALATION_SPRAY | Freq: Two times a day (BID) | RESPIRATORY_TRACT | Status: DC
  Administered 2015-04-19: 2 via RESPIRATORY_TRACT
  Filled 2015-04-18: qty 8.8

## 2015-04-18 MED ORDER — PROPOFOL 10 MG/ML IV BOLUS
INTRAVENOUS | Status: DC | PRN
  Administered 2015-04-18: 150 mg via INTRAVENOUS

## 2015-04-18 MED ORDER — ONDANSETRON HCL 4 MG/2ML IJ SOLN: 4.0000 mg | Freq: Four times a day (QID) | INTRAMUSCULAR | Status: DC | PRN

## 2015-04-18 MED ORDER — HYDROCHLOROTHIAZIDE 12.5 MG PO CAPS
12.5000 mg | ORAL_CAPSULE | Freq: Every day | ORAL | Status: DC
  Administered 2015-04-19 – 2015-04-20 (×2): 12.5 mg via ORAL
  Filled 2015-04-18 (×2): qty 1

## 2015-04-18 MED ORDER — OXYCODONE HCL 5 MG PO TABS
5.0000 mg | ORAL_TABLET | ORAL | Status: DC | PRN
  Administered 2015-04-19 – 2015-04-20 (×5): 5 mg via ORAL
  Filled 2015-04-18 (×6): qty 1

## 2015-04-18 MED ORDER — DEXAMETHASONE SODIUM PHOSPHATE 4 MG/ML IJ SOLN
INTRAMUSCULAR | Status: DC | PRN
  Administered 2015-04-18: 4 mg via INTRAVENOUS

## 2015-04-18 MED ORDER — OXYCODONE HCL 5 MG PO TABS
5.0000 mg | ORAL_TABLET | Freq: Once | ORAL | Status: AC | PRN
  Administered 2015-04-18: 5 mg via ORAL

## 2015-04-18 MED ORDER — METOCLOPRAMIDE HCL 5 MG/ML IJ SOLN: 5.0000 mg | Freq: Three times a day (TID) | INTRAMUSCULAR | Status: DC | PRN

## 2015-04-18 MED ORDER — BUPIVACAINE HCL (PF) 0.25 % IJ SOLN
INTRAMUSCULAR | Status: AC
  Filled 2015-04-18: qty 30

## 2015-04-18 MED ORDER — MORPHINE SULFATE 4 MG/ML IJ SOLN
INTRAMUSCULAR | Status: DC | PRN
  Administered 2015-04-18: 8 mg via INTRAVENOUS

## 2015-04-18 MED ORDER — METOCLOPRAMIDE HCL 10 MG PO TABS: 5.0000 mg | ORAL_TABLET | Freq: Three times a day (TID) | ORAL | Status: DC | PRN

## 2015-04-18 MED ORDER — MORPHINE SULFATE 4 MG/ML IJ SOLN
4.0000 mg | INTRAMUSCULAR | Status: DC | PRN
  Administered 2015-04-18: 4 mg via INTRAVENOUS
  Filled 2015-04-18: qty 1

## 2015-04-18 MED ORDER — CEFAZOLIN SODIUM-DEXTROSE 2-3 GM-% IV SOLR
2.0000 g | INTRAVENOUS | Status: AC
  Administered 2015-04-18: 2 g via INTRAVENOUS
  Filled 2015-04-18: qty 50

## 2015-04-18 MED ORDER — CLONIDINE HCL (ANALGESIA) 100 MCG/ML EP SOLN
EPIDURAL | Status: DC | PRN
  Administered 2015-04-18: .8 mL

## 2015-04-18 MED ORDER — POTASSIUM CHLORIDE IN NACL 20-0.9 MEQ/L-% IV SOLN
INTRAVENOUS | Status: AC
  Administered 2015-04-18: 23:00:00 via INTRAVENOUS
  Filled 2015-04-18 (×3): qty 1000

## 2015-04-18 MED ORDER — 0.9 % SODIUM CHLORIDE (POUR BTL) OPTIME
TOPICAL | Status: DC | PRN
  Administered 2015-04-18 (×3): 1000 mL

## 2015-04-18 MED ORDER — LEVOTHYROXINE SODIUM 25 MCG PO TABS
25.0000 ug | ORAL_TABLET | Freq: Every day | ORAL | Status: DC
  Administered 2015-04-19 – 2015-04-20 (×2): 25 ug via ORAL
  Filled 2015-04-18 (×2): qty 1

## 2015-04-18 MED ORDER — BUPIVACAINE LIPOSOME 1.3 % IJ SUSP
20.0000 mL | INTRAMUSCULAR | Status: DC
  Filled 2015-04-18: qty 20

## 2015-04-18 MED ORDER — ONDANSETRON HCL 4 MG/2ML IJ SOLN
INTRAMUSCULAR | Status: DC | PRN
  Administered 2015-04-18: 4 mg via INTRAVENOUS

## 2015-04-18 MED ORDER — ACETAMINOPHEN 650 MG RE SUPP: 650.0000 mg | Freq: Four times a day (QID) | RECTAL | Status: DC | PRN

## 2015-04-18 MED ORDER — MIDAZOLAM HCL 2 MG/2ML IJ SOLN
1.0000 mg | INTRAMUSCULAR | Status: DC | PRN
  Administered 2015-04-18 (×3): 1 mg via INTRAVENOUS

## 2015-04-18 MED ORDER — CHLORHEXIDINE GLUCONATE 4 % EX LIQD: 60.0000 mL | Freq: Once | CUTANEOUS | Status: DC

## 2015-04-18 MED ORDER — ONDANSETRON HCL 4 MG/2ML IJ SOLN
4.0000 mg | Freq: Four times a day (QID) | INTRAMUSCULAR | Status: DC | PRN
  Administered 2015-04-20: 4 mg via INTRAVENOUS
  Filled 2015-04-18: qty 2

## 2015-04-18 MED ORDER — CETYLPYRIDINIUM CHLORIDE 0.05 % MT LIQD
7.0000 mL | Freq: Two times a day (BID) | OROMUCOSAL | Status: DC
Start: 2015-04-18 — End: 2015-04-20
  Administered 2015-04-18 – 2015-04-20 (×4): 7 mL via OROMUCOSAL

## 2015-04-18 MED ORDER — MIDAZOLAM HCL 2 MG/2ML IJ SOLN
INTRAMUSCULAR | Status: AC
  Administered 2015-04-18: 1 mg via INTRAVENOUS
  Filled 2015-04-18: qty 2

## 2015-04-18 MED ORDER — LACTATED RINGERS IV SOLN
INTRAVENOUS | Status: DC
  Administered 2015-04-18 (×2): via INTRAVENOUS

## 2015-04-18 MED ORDER — HYDROMORPHONE HCL 1 MG/ML IJ SOLN
0.2500 mg | INTRAMUSCULAR | Status: DC | PRN
  Administered 2015-04-18 (×2): 0.5 mg via INTRAVENOUS

## 2015-04-18 MED ORDER — BUPIVACAINE HCL (PF) 0.25 % IJ SOLN
INTRAMUSCULAR | Status: DC | PRN
  Administered 2015-04-18: 10 mL
  Administered 2015-04-18: 15 mL

## 2015-04-18 MED ORDER — OXYCODONE HCL 5 MG/5ML PO SOLN: 5.0000 mg | Freq: Once | ORAL | Status: AC | PRN

## 2015-04-18 MED ORDER — EPHEDRINE SULFATE 50 MG/ML IJ SOLN
INTRAMUSCULAR | Status: DC | PRN
  Administered 2015-04-18: 15 mg via INTRAVENOUS

## 2015-04-18 MED ORDER — OXYCODONE HCL 5 MG PO TABS
ORAL_TABLET | ORAL | Status: AC
  Filled 2015-04-18: qty 1

## 2015-04-18 MED ORDER — ONDANSETRON HCL 4 MG PO TABS: 4.0000 mg | ORAL_TABLET | Freq: Four times a day (QID) | ORAL | Status: DC | PRN

## 2015-04-18 MED ORDER — PROPOFOL 10 MG/ML IV BOLUS
INTRAVENOUS | Status: AC
  Filled 2015-04-18: qty 20

## 2015-04-18 MED ORDER — PANTOPRAZOLE SODIUM 40 MG PO TBEC
40.0000 mg | DELAYED_RELEASE_TABLET | Freq: Every day | ORAL | Status: DC
  Administered 2015-04-19 – 2015-04-20 (×2): 40 mg via ORAL
  Filled 2015-04-18 (×2): qty 1

## 2015-04-18 MED ORDER — BUPIVACAINE-EPINEPHRINE (PF) 0.5% -1:200000 IJ SOLN
INTRAMUSCULAR | Status: DC | PRN
  Administered 2015-04-18: 25 mL via PERINEURAL

## 2015-04-18 MED ORDER — CITALOPRAM HYDROBROMIDE 20 MG PO TABS
10.0000 mg | ORAL_TABLET | Freq: Every day | ORAL | Status: DC
  Administered 2015-04-19 – 2015-04-20 (×2): 10 mg via ORAL
  Filled 2015-04-18 (×2): qty 1

## 2015-04-18 MED ORDER — LIDOCAINE HCL (CARDIAC) 20 MG/ML IV SOLN
INTRAVENOUS | Status: DC | PRN
  Administered 2015-04-18: 80 mg via INTRAVENOUS

## 2015-04-18 MED ORDER — CLONIDINE HCL (ANALGESIA) 100 MCG/ML EP SOLN
150.0000 ug | EPIDURAL | Status: DC
  Filled 2015-04-18: qty 1.5

## 2015-04-18 MED ORDER — FENTANYL CITRATE (PF) 100 MCG/2ML IJ SOLN
INTRAMUSCULAR | Status: AC
  Administered 2015-04-18: 50 ug via INTRAVENOUS
  Filled 2015-04-18: qty 2

## 2015-04-18 MED ORDER — FENTANYL CITRATE (PF) 100 MCG/2ML IJ SOLN
50.0000 ug | INTRAMUSCULAR | Status: AC | PRN
  Administered 2015-04-18 (×5): 50 ug via INTRAVENOUS
  Administered 2015-04-18: 100 ug via INTRAVENOUS

## 2015-04-18 MED ORDER — ACETAMINOPHEN 325 MG PO TABS: 650.0000 mg | ORAL_TABLET | Freq: Four times a day (QID) | ORAL | Status: DC | PRN

## 2015-04-18 MED ORDER — PHENOL 1.4 % MT LIQD: 1.0000 | OROMUCOSAL | Status: DC | PRN

## 2015-04-18 SURGICAL SUPPLY — 78 items
BANDAGE ELASTIC 4 VELCRO ST LF (GAUZE/BANDAGES/DRESSINGS) ×3 IMPLANT
BANDAGE ELASTIC 6 VELCRO ST LF (GAUZE/BANDAGES/DRESSINGS) ×3 IMPLANT
BANDAGE ESMARK 6X9 LF (GAUZE/BANDAGES/DRESSINGS) ×1 IMPLANT
BLADE SAG 18X100X1.27 (BLADE) ×6 IMPLANT
BLADE SAW SGTL 13.0X1.19X90.0M (BLADE) IMPLANT
BNDG COHESIVE 6X5 TAN STRL LF (GAUZE/BANDAGES/DRESSINGS) ×3 IMPLANT
BNDG ELASTIC 6X10 VLCR STRL LF (GAUZE/BANDAGES/DRESSINGS) ×6 IMPLANT
BNDG ESMARK 6X9 LF (GAUZE/BANDAGES/DRESSINGS) ×3
BOWL SMART MIX CTS (DISPOSABLE) ×3 IMPLANT
CAPT KNEE TOTAL 3 ×3 IMPLANT
CEMENT BONE SIMPLEX SPEEDSET (Cement) ×6 IMPLANT
CLOSURE WOUND 1/2 X4 (GAUZE/BANDAGES/DRESSINGS) ×2
COVER SURGICAL LIGHT HANDLE (MISCELLANEOUS) ×3 IMPLANT
CUFF TOURNIQUET SINGLE 34IN LL (TOURNIQUET CUFF) ×3 IMPLANT
CUFF TOURNIQUET SINGLE 44IN (TOURNIQUET CUFF) IMPLANT
DECANTER SPIKE VIAL GLASS SM (MISCELLANEOUS) ×6 IMPLANT
DRAPE INCISE IOBAN 66X45 STRL (DRAPES) IMPLANT
DRAPE ORTHO SPLIT 77X108 STRL (DRAPES) ×6
DRAPE SURG ORHT 6 SPLT 77X108 (DRAPES) ×3 IMPLANT
DRAPE U-SHAPE 47X51 STRL (DRAPES) ×3 IMPLANT
DRSG MEPILEX BORDER 4X8 (GAUZE/BANDAGES/DRESSINGS) ×3 IMPLANT
DRSG PAD ABDOMINAL 8X10 ST (GAUZE/BANDAGES/DRESSINGS) ×6 IMPLANT
DURAPREP 26ML APPLICATOR (WOUND CARE) ×3 IMPLANT
ELECT REM PT RETURN 9FT ADLT (ELECTROSURGICAL) ×3
ELECTRODE REM PT RTRN 9FT ADLT (ELECTROSURGICAL) ×1 IMPLANT
FACESHIELD WRAPAROUND (MASK) ×3 IMPLANT
GAUZE SPONGE 4X4 12PLY STRL (GAUZE/BANDAGES/DRESSINGS) ×3 IMPLANT
GAUZE XEROFORM 5X9 LF (GAUZE/BANDAGES/DRESSINGS) ×3 IMPLANT
GLOVE BIO SURGEON STRL SZ 6.5 (GLOVE) ×2 IMPLANT
GLOVE BIO SURGEON STRL SZ7 (GLOVE) ×6 IMPLANT
GLOVE BIO SURGEONS STRL SZ 6.5 (GLOVE) ×1
GLOVE BIOGEL PI IND STRL 7.0 (GLOVE) ×1 IMPLANT
GLOVE BIOGEL PI IND STRL 7.5 (GLOVE) ×1 IMPLANT
GLOVE BIOGEL PI IND STRL 8 (GLOVE) ×2 IMPLANT
GLOVE BIOGEL PI INDICATOR 7.0 (GLOVE) ×2
GLOVE BIOGEL PI INDICATOR 7.5 (GLOVE) ×2
GLOVE BIOGEL PI INDICATOR 8 (GLOVE) ×4
GLOVE ECLIPSE 7.0 STRL STRAW (GLOVE) ×6 IMPLANT
GLOVE SURG ORTHO 8.0 STRL STRW (GLOVE) ×6 IMPLANT
GLOVE SURG SS PI 6.5 STRL IVOR (GLOVE) ×3 IMPLANT
GOWN STRL REUS W/ TWL LRG LVL3 (GOWN DISPOSABLE) ×3 IMPLANT
GOWN STRL REUS W/TWL 2XL LVL3 (GOWN DISPOSABLE) ×3 IMPLANT
GOWN STRL REUS W/TWL LRG LVL3 (GOWN DISPOSABLE) ×6
HANDPIECE INTERPULSE COAX TIP (DISPOSABLE) ×2
HOOD PEEL AWAY FACE SHEILD DIS (HOOD) ×9 IMPLANT
IMMOBILIZER KNEE 20 (SOFTGOODS) IMPLANT
IMMOBILIZER KNEE 22 UNIV (SOFTGOODS) ×3 IMPLANT
IMMOBILIZER KNEE 24 THIGH 36 (MISCELLANEOUS) IMPLANT
IMMOBILIZER KNEE 24 UNIV (MISCELLANEOUS)
KIT BASIN OR (CUSTOM PROCEDURE TRAY) ×3 IMPLANT
KIT ROOM TURNOVER OR (KITS) ×3 IMPLANT
MANIFOLD NEPTUNE II (INSTRUMENTS) ×3 IMPLANT
NEEDLE 18GX1X1/2 (RX/OR ONLY) (NEEDLE) ×3 IMPLANT
NEEDLE HYPO 22GX1.5 SAFETY (NEEDLE) ×3 IMPLANT
NEEDLE SPNL 18GX3.5 QUINCKE PK (NEEDLE) ×3 IMPLANT
NS IRRIG 1000ML POUR BTL (IV SOLUTION) ×6 IMPLANT
PACK TOTAL JOINT (CUSTOM PROCEDURE TRAY) ×3 IMPLANT
PACK UNIVERSAL I (CUSTOM PROCEDURE TRAY) ×6 IMPLANT
PAD ARMBOARD 7.5X6 YLW CONV (MISCELLANEOUS) ×6 IMPLANT
PAD CAST 4YDX4 CTTN HI CHSV (CAST SUPPLIES) ×1 IMPLANT
PADDING CAST COTTON 4X4 STRL (CAST SUPPLIES) ×2
PADDING CAST COTTON 6X4 STRL (CAST SUPPLIES) ×3 IMPLANT
SET HNDPC FAN SPRY TIP SCT (DISPOSABLE) ×1 IMPLANT
SPONGE GAUZE 4X4 12PLY STER LF (GAUZE/BANDAGES/DRESSINGS) ×3 IMPLANT
SPONGE LAP 18X18 X RAY DECT (DISPOSABLE) ×3 IMPLANT
STRIP CLOSURE SKIN 1/2X4 (GAUZE/BANDAGES/DRESSINGS) ×4 IMPLANT
SUCTION FRAZIER TIP 10 FR DISP (SUCTIONS) ×3 IMPLANT
SUT MNCRL AB 3-0 PS2 18 (SUTURE) ×3 IMPLANT
SUT VIC AB 0 CTB1 27 (SUTURE) ×6 IMPLANT
SUT VIC AB 1 CT1 27 (SUTURE) ×8
SUT VIC AB 1 CT1 27XBRD ANBCTR (SUTURE) ×4 IMPLANT
SUT VIC AB 2-0 CT1 27 (SUTURE) ×6
SUT VIC AB 2-0 CT1 TAPERPNT 27 (SUTURE) ×3 IMPLANT
SYR 30ML LL (SYRINGE) ×9 IMPLANT
SYR TB 1ML LUER SLIP (SYRINGE) ×3 IMPLANT
TOWEL OR 17X24 6PK STRL BLUE (TOWEL DISPOSABLE) ×6 IMPLANT
TOWEL OR 17X26 10 PK STRL BLUE (TOWEL DISPOSABLE) ×6 IMPLANT
WATER STERILE IRR 1000ML POUR (IV SOLUTION) ×6 IMPLANT

## 2015-04-18 NOTE — Brief Op Note (Signed)
04/18/2015  1:42 PM  PATIENT:  Jade Walker  69 y.o. female  PRE-OPERATIVE DIAGNOSIS:  LEFT KNEE OSTEOARTHRITIS  POST-OPERATIVE DIAGNOSIS:  LEFT KNEE OSTEOARTHRITIS  PROCEDURE:  Procedure(s): TOTAL KNEE ARTHROPLASTY  SURGEON:  Surgeon(s): Cammy Copa, MD  ASSISTANT: carla bethune rnfa  ANESTHESIA:   general  EBL: 50 ml    Total I/O In: 1100 [I.V.:1100] Out: 50 [Blood:50]  BLOOD ADMINISTERED: none  DRAINS: none   LOCAL MEDICATIONS USED:  Marcaine mso4  clonidine  SPECIMEN:  No Specimen  COUNTS:  YES  TOURNIQUET:   Total Tourniquet Time Documented: Thigh (Left) - 75 minutes Total: Thigh (Left) - 75 minutes   DICTATION: .Other Dictation: Dictation Number [id number]  PLAN OF CARE: Admit to inpatient   PATIENT DISPOSITION:  PACU - hemodynamically stable

## 2015-04-18 NOTE — H&P (Signed)
TOTAL KNEE ADMISSION H&P  Patient is being admitted for left total knee arthroplasty.  Subjective:  Chief Complaint:left knee pain.  HPI: Jade Walker, 69 y.o. female, has a history of pain and functional disability in the left knee due to arthritis and has failed non-surgical conservative treatments for greater than 12 weeks to includeNSAID's and/or analgesics, corticosteriod injections, viscosupplementation injections, flexibility and strengthening excercises, supervised PT with diminished ADL's post treatment, use of assistive devices and activity modification.  Onset of symptoms was gradual, starting >10 years ago with gradually worsening course since that time. The patient noted no past surgery on the left knee(s).  Patient currently rates pain in the left knee(s) at 9 out of 10 with activity. Patient has night pain, worsening of pain with activity and weight bearing, pain that interferes with activities of daily living, pain with passive range of motion, crepitus and joint swelling.  Patient has evidence of subchondral sclerosis, periarticular osteophytes and joint space narrowing by imaging studies. 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 There is no active infection.  There are no active problems to display for this patient.  Past Medical History  Diagnosis Date  . GERD (gastroesophageal reflux disease)   . Anxiety   . Stress incontinence     from Care Everywhere  . History of hiatal hernia   . Hypertension   . Asthma   . Pneumonia   . Osteoporosis   . Hypothyroidism   . Arthritis     DDD - lumbar (from Care Everywhere), osteoarthritis  . Full dentures   . Environmental and seasonal allergies   . Complication of anesthesia     blood loss after back surgery because hernia was scratched and "torn" during intubation (02/15/11 EGD showed GIB d/t esophageal ulcer)  . Shortness of breath dyspnea     climbing steps  . History of kidney  stones   . Headache     sinus  . Iron deficiency anemia     from Care Everywhere    Past Surgical History  Procedure Laterality Date  . Eye surgery Bilateral     cataract surgery with lens implant  . Colonoscopy    . Tonsillectomy    . Cholecystectomy    . Abdominal hysterectomy    . Back surgery      lumbar x 3, 1 thoracic spine  . Open reduction internal fixation (orif) distal radial fracture Left 03/14/2015    Procedure: OPEN REDUCTION INTERNAL FIXATION (ORIF) LEFT DISTAL RADIUS FRACTURE;  Surgeon: Cammy Copa, MD;  Location: [facility] OR;  Service: Orthopedics;  Laterality: Left;    Prescriptions prior to admission  Medication Sig Dispense Refill Last Dose  . albuterol (PROVENTIL) (2.5 MG/3ML) 0.083% nebulizer solution Take 2.5 mg by nebulization every 6 (six) hours as needed for wheezing or shortness of breath. i puff once a day as needed   Past Week at Unknown time  . cetirizine (ZYRTEC) 10 MG tablet Take 10 mg by mouth daily.   04/18/2015 at 0630  . citalopram (CELEXA) 10 MG tablet Take 10 mg by mouth daily.   04/18/2015 at 0630  . dextromethorphan-guaiFENesin (MUCINEX DM) 30-600 MG per 12 hr tablet Take 1 tablet by mouth 2 (two) times daily as needed for cough (or congestion).   04/17/2015 at Unknown time  . Diethylpropion HCl CR 75 MG TB24 Take 75 mg by mouth daily.   Past Month at Unknown time  . Fluticasone-Salmeterol (ADVAIR)  250-50 MCG/DOSE AEPB Inhale 1 puff into the lungs 2 (two) times daily.   Past Month at Unknown time  . levothyroxine (SYNTHROID, LEVOTHROID) 25 MCG tablet Take 25 mcg by mouth daily before breakfast.   04/18/2015 at 0630  . losartan-hydrochlorothiazide (HYZAAR) 50-12.5 MG per tablet Take 1 tablet by mouth daily.  5 04/18/2015 at 0630  . meloxicam (MOBIC) 7.5 MG tablet Take 7.5 mg by mouth daily.   04/17/2015 at Unknown time  . omeprazole (PRILOSEC) 40 MG capsule Take 40 mg by mouth 2 (two) times daily.   04/18/2015 at 0630  . oxyCODONE-acetaminophen (PERCOCET/ROXICET)  5-325 MG per tablet Take 1 tablet by mouth every 4 (four) hours as needed for severe pain.   04/17/2015 at Unknown time  . tiZANidine (ZANAFLEX) 4 MG tablet Take 4 mg by mouth every 8 (eight) hours as needed for muscle spasms.   04/17/2015 at Unknown time  . Vitamin D, Ergocalciferol, (DRISDOL) 50000 UNITS CAPS capsule Take 50,000 Units by mouth every 7 (seven) days. On Monday.   Past Week at Unknown time   Allergies  Allergen Reactions  . Codeine Other (See Comments)    Large doses make her sweat, heart flutter  . Sulfamethoxazole-Trimethoprim Rash    History  Substance Use Topics  . Smoking status: Never Smoker   . Smokeless tobacco: Never Used  . Alcohol Use: No    Family History  Problem Relation Age of Onset  . CAD Mother   . Emphysema Father   . Alcoholism Father      Review of Systems  Constitutional: Negative.   HENT: Negative.   Eyes: Negative.   Respiratory: Negative.   Cardiovascular: Negative.   Gastrointestinal: Negative.   Genitourinary: Negative.   Musculoskeletal: Positive for joint pain.  Skin: Negative.   Neurological: Negative.   Endo/Heme/Allergies: Negative.   Psychiatric/Behavioral: Negative.     Objective:  Physical Exam  Constitutional: She appears well-developed.  HENT:  Head: Normocephalic.  Eyes: Pupils are equal, round, and reactive to light.  Neck: Normal range of motion.  Cardiovascular: Normal rate.   Respiratory: Effort normal.  Neurological: She is alert.  Skin: Skin is warm.  Psychiatric: She has a normal mood and affect.  left knee rom 5 - 115 - skin ok - collaterals ok ext mechanism ok - df/pf 5/5 - dp 2/ 4 medial joint line tenderness  Vital signs in last 24 hours: Temp:  [98.3 F (36.8 C)] 98.3 F (36.8 C) (08/09 0914) Pulse Rate:  [59-71] 71 (08/09 1035) Resp:  [10-20] 12 (08/09 1035) BP: (110-152)/(48-78) 110/48 mmHg (08/09 1035) SpO2:  [99 %-100 %] 99 % (08/09 1035) Weight:  [76.703 kg (169 lb 1.6 oz)] 76.703 kg (169 lb  1.6 oz) (08/09 0914)  Labs:   Estimated body mass index is 27.31 kg/(m^2) as calculated from the following:   Height as of this encounter:  (1.676 m).   Weight as of this encounter: 76.703 kg (169 lb 1.6 oz).   Imaging Review Plain radiographs demonstrate severe degenerative joint disease of the left knee(s). The overall alignment ismild varus. The bone quality appears to be good for age and reported activity level.  Assessment/Plan:  End stage arthritis, left knee   The patient history, physical examination, clinical judgment of the provider and imaging studies are consistent with end stage degenerative joint disease of the left knee(s) and total knee arthroplasty is deemed medically necessary. The treatment options including medical management, injection therapy arthroscopy and arthroplasty were discussed  at length. The risks and benefits of total knee arthroplasty were presented and reviewed. The risks due to aseptic loosening, infection, stiffness, patella tracking problems, thromboembolic complications and other imponderables were discussed. The patient acknowledged the explanation, agreed to proceed with the plan and consent was signed. Patient is being admitted for inpatient treatment for surgery, pain control, PT, OT, prophylactic antibiotics, VTE prophylaxis, progressive ambulation and ADL's and discharge planning. The patient is planning to be discharged home with home health services

## 2015-04-18 NOTE — Anesthesia Preprocedure Evaluation (Signed)
Anesthesia Evaluation  Patient identified by MRN, date of birth, ID band Patient awake    Reviewed: Allergy & Precautions, NPO status , Patient's Chart, lab work & pertinent test results  Airway Mallampati: II   Neck ROM: full    Dental   Pulmonary shortness of breath, asthma ,  breath sounds clear to auscultation        Cardiovascular hypertension, Rhythm:regular Rate:Normal     Neuro/Psych  Headaches, Anxiety    GI/Hepatic hiatal hernia, GERD-  ,  Endo/Other  Hypothyroidism   Renal/GU      Musculoskeletal  (+) Arthritis -,   Abdominal   Peds  Hematology   Anesthesia Other Findings   Reproductive/Obstetrics                             Anesthesia Physical Anesthesia Plan  ASA: II  Anesthesia Plan: General and Regional   Post-op Pain Management:    Induction: Intravenous  Airway Management Planned: Oral ETT  Additional Equipment:   Intra-op Plan:   Post-operative Plan: Extubation in OR  Informed Consent: I have reviewed the patients History and Physical, chart, labs and discussed the procedure including the risks, benefits and alternatives for the proposed anesthesia with the patient or authorized representative who has indicated his/her understanding and acceptance.     Plan Discussed with: CRNA, Anesthesiologist and Surgeon  Anesthesia Plan Comments:         Anesthesia Quick Evaluation

## 2015-04-18 NOTE — Anesthesia Postprocedure Evaluation (Signed)
Anesthesia Post Note  Patient: Jade Walker  Procedure(s) Performed: Procedure(s) (LRB): TOTAL KNEE ARTHROPLASTY (Left)  Anesthesia type: General  Patient location: PACU  Post pain: Pain level controlled and Adequate analgesia  Post assessment: Post-op Vital signs reviewed, Patient's Cardiovascular Status Stable, Respiratory Function Stable, Patent Airway and Pain level controlled  Last Vitals:  Filed Vitals:   04/18/15 1434  BP:   Pulse: 76  Temp:   Resp: 21    Post vital signs: Reviewed and stable  Level of consciousness: awake, alert  and oriented  Complications: No apparent anesthesia complications

## 2015-04-18 NOTE — Progress Notes (Signed)
Orthopedic Tech Progress Note Patient Details:  Jade Walker July 26, 1946 [id number]  CPM Left Knee CPM Left Knee: On Left Knee Flexion (Degrees): 40 Left Knee Extension (Degrees): 10 Additional Comments: Trapeze bar and foot roll   Shawnie Pons 04/18/2015, 3:23 PM

## 2015-04-18 NOTE — Transfer of Care (Signed)
Immediate Anesthesia Transfer of Care Note  Patient: Jade Walker  Procedure(s) Performed: Procedure(s): TOTAL KNEE ARTHROPLASTY (Left)  Patient Location: PACU  Anesthesia Type:general  Level of Consciousness: awake, alert , patient cooperative and responds to stimulation  Airway & Oxygen Therapy: Patient Spontanous Breathing and Patient connected to face mask oxygen  Post-op Assessment: Report given to RN, Post -op Vital signs reviewed and stable and Patient moving all extremities X 4  Post vital signs: Reviewed and stable  Last Vitals:  Filed Vitals:   04/18/15 1035  BP: 110/48  Pulse: 71  Temp:   Resp: 12    Complications: No apparent anesthesia complications

## 2015-04-18 NOTE — Consult Note (Addendum)
Admit date: 04/18/2015 Referring Physician  Dr. Dorene Grebe Primary Physician BEAN,BILLIE-LYNN, PA-C Primary Cardiologist  None Reason for Consultation  SVT during induction of anesthesia  HPI: 69 year old female with degenerative knee arthritis who underwent left total knee arthroplasty today with Dr. August Saucer who during induction of anesthesia developed approximately 1 minute of supraventricular tachycardia with heart rates of approximately 130-140 bpm. There is no current telemetry strip to reevaluate rhythm at this point.  She has no prior cardiac history. EKG on 03/03/15 shows sinus rhythm, poor R-wave progression otherwise normal EKG.  Electrolytes today history creatinine of 1.7, previously on 04/07/15 creatinine was 1.8. Potassium is 4.5. Hemoglobin 12.2.  Has hypertension, takes angiotensin receptor blocker/HCTZ combination. No beta blocker.  No prior history of syncope, collapse, palpitations.  Currently in PACU, doing well, normal sinus rhythm, no ST segment changes.    PMH:   Past Medical History  Diagnosis Date  . GERD (gastroesophageal reflux disease)   . Anxiety   . Stress incontinence     from Care Everywhere  . History of hiatal hernia   . Hypertension   . Asthma   . Pneumonia   . Osteoporosis   . Hypothyroidism   . Arthritis     DDD - lumbar (from Care Everywhere), osteoarthritis  . Full dentures   . Environmental and seasonal allergies   . Complication of anesthesia     blood loss after back surgery because hernia was scratched and "torn" during intubation (02/15/11 EGD showed GIB d/t esophageal ulcer)  . Shortness of breath dyspnea     climbing steps  . History of kidney stones   . Headache     sinus  . Iron deficiency anemia     from Care Everywhere    PSH:   Past Surgical History  Procedure Laterality Date  . Eye surgery Bilateral     cataract surgery with lens implant  . Colonoscopy    . Tonsillectomy    . Cholecystectomy    . Abdominal  hysterectomy    . Back surgery      lumbar x 3, 1 thoracic spine  . Open reduction internal fixation (orif) distal radial fracture Left 03/14/2015    Procedure: OPEN REDUCTION INTERNAL FIXATION (ORIF) LEFT DISTAL RADIUS FRACTURE;  Surgeon: Cammy Copa, MD;  Location: [facility] OR;  Service: Orthopedics;  Laterality: Left;   Allergies:  Codeine and Sulfamethoxazole-trimethoprim Prior to Admit Meds:   Prior to Admission medications   Medication Sig Start Date End Date Taking? Authorizing Provider  albuterol (PROVENTIL) (2.5 MG/3ML) 0.083% nebulizer solution Take 2.5 mg by nebulization every 6 (six) hours as needed for wheezing or shortness of breath. i puff once a day as needed   Yes Historical Provider, MD  cetirizine (ZYRTEC) 10 MG tablet Take 10 mg by mouth daily.   Yes Historical Provider, MD  citalopram (CELEXA) 10 MG tablet Take 10 mg by mouth daily.   Yes Historical Provider, MD  dextromethorphan-guaiFENesin (MUCINEX DM) 30-600 MG per 12 hr tablet Take 1 tablet by mouth 2 (two) times daily as needed for cough (or congestion).   Yes Historical Provider, MD  Diethylpropion HCl CR 75 MG TB24 Take 75 mg by mouth daily.   Yes Historical Provider, MD  Fluticasone-Salmeterol (ADVAIR) 250-50 MCG/DOSE AEPB Inhale 1 puff into the lungs 2 (two) times daily.   Yes Historical Provider, MD  levothyroxine (SYNTHROID, LEVOTHROID) 25 MCG tablet Take 25 mcg by mouth daily before breakfast.   Yes Historical Provider,  MD  losartan-hydrochlorothiazide (HYZAAR) 50-12.5 MG per tablet Take 1 tablet by mouth daily. 02/17/15  Yes Historical Provider, MD  meloxicam (MOBIC) 7.5 MG tablet Take 7.5 mg by mouth daily.   Yes Historical Provider, MD  omeprazole (PRILOSEC) 40 MG capsule Take 40 mg by mouth 2 (two) times daily.   Yes Historical Provider, MD  oxyCODONE-acetaminophen (PERCOCET/ROXICET) 5-325 MG per tablet Take 1 tablet by mouth every 4 (four) hours as needed for severe pain.   Yes Historical Provider, MD    tiZANidine (ZANAFLEX) 4 MG tablet Take 4 mg by mouth every 8 (eight) hours as needed for muscle spasms.   Yes Historical Provider, MD  Vitamin D, Ergocalciferol, (DRISDOL) 50000 UNITS CAPS capsule Take 50,000 Units by mouth every 7 (seven) days. On Monday.   Yes Historical Provider, MD   Fam HX:    Family History  Problem Relation Age of Onset  . CAD Mother   . Emphysema Father   . Alcoholism Father    Social HX:    History   Social History  . Marital Status: Married    Spouse Name: N/A  . Number of Children: N/A  . Years of Education: N/A   Occupational History  . Not on file.   Social History Main Topics  . Smoking status: Never Smoker   . Smokeless tobacco: Never Used  . Alcohol Use: No  . Drug Use: No  . Sexual Activity: Not on file   Other Topics Concern  . Not on file   Social History Narrative     ROS:  No recent fevers, chills, orthopnea, PND, shortness of breath, chest pain. Except for left knee pain, no prior cardiovascular illnesses All 11 ROS were addressed and are negative except what is stated in the HPI   Physical Exam: Blood pressure 110/48, pulse 71, temperature 98.3 F (36.8 C), temperature source Oral, resp. rate 12, height  (1.676 m), weight 169 lb 1.6 oz (76.703 kg), SpO2 99 %.   General: Well developed, well nourished, in no acute distress Head: Eyes PERRLA, No xanthomas.   Normal cephalic and atramatic  Lungs:   Clear bilaterally to auscultation and percussion. Normal respiratory effort. No wheezes, no rales. Heart:   HRRR S1 S2 Pulses are 2+ & equal. No murmur, rubs, gallops.  No carotid bruit. No JVD.  No abdominal bruits.  Abdomen: Bowel sounds are positive, abdomen soft and non-tender without masses. No hepatosplenomegaly. Msk:  Back normal. Normal strength and tone for age. Extremities:  No clubbing, cyanosis or edema.  DP +1, postop left knee replacement. Neuro: Alert and oriented X 3, non-focal, MAE x 4 GU: Deferred Rectal:  Deferred Psych:  Good affect, responds appropriately      Labs: Lab Results  Component Value Date   WBC 8.4 04/07/2015   HGB 11.9* 04/18/2015   HCT 35.0* 04/18/2015   MCV 91.4 04/07/2015   PLT 339 04/07/2015     Recent Labs Lab 04/18/15 1012  NA 135  K 4.5  CL 99*  BUN 29*  CREATININE 1.70*  GLUCOSE 85   No results for input(s): CKTOTAL, CKMB, TROPONINI in the last 72 hours. No results found for: CHOL, HDL, LDLCALC, TRIG No results found for: [facility]   Radiology:  Prior abdominal/lumbar spine CT reviewed, no evidence of significant aortic atherosclerosis. Personally viewed.  EKG:  As described above Personally viewed.   ASSESSMENT/PLAN:    69 year old female with hypertension, left knee arthroplasty on 04/18/15 who developed transient supraventricular tachycardia  with heart rate approximately 130 to 140 bpm lasting 1 minute's duration currently stable in normal rhythm.  1. Supraventricular tachycardia-brief transient supraventricular tachycardia in the setting of anesthesia induction. Unfortunately, I do not have rhythm strip to evaluate. Possible etiologies include paroxysmal atrial tachycardia, brief atrial fibrillation (although arrhythmia was not described as a regularly irregular), potential reentrant tachycardia however heart rate seems fairly slow for atypical reentrant tachycardia. Could also be transient sinus tachycardia however if episode occurred abruptly and terminated abruptly, this seems unlikely. Nonetheless, she is currently stable, I would not add any further educations unless we see return of arrhythmia. I agree with monitoring, telemetry at least overnight. If necessary, may need beta blocker such as metoprolol tartrate 25 mg twice a day or calcium channel blocker such as diltiazem long-acting, CD 120 mg once a day. She has reported in the past the sensation of palpitations. She has had no dangerous side effects from this such as angina, syncope, shortness of  breath. She does not drink alcohol, no Sudafed. For now, continue to monitor and she should not require any prolonged hospital stay because of this transient cardiac arrhythmia. If these arrhythmias continue, one could consider TSH/echocardiogram.  2. Essential hypertension-currently well controlled.  3. Osteoarthritis left knee-status post arthroplasty, Dr. August Saucer  4. Chronic kidney disease stage III-previous creatinine in the 1.7 range. Avoid nephrotoxic agents.  We will follow along.  Donato Schultz, MD  04/18/2015  2:09 PM

## 2015-04-18 NOTE — Anesthesia Procedure Notes (Addendum)
Anesthesia Regional Block:  Adductor canal block  Pre-Anesthetic Checklist: ,, timeout performed, Correct Patient, Correct Site, Correct Laterality, Correct Procedure, Correct Position, site marked, Risks and benefits discussed,  Surgical consent,  Pre-op evaluation,  At surgeon's request and post-op pain management  Laterality: Left  Prep: chloraprep       Needles:  Injection technique: Single-shot  Needle Type: Echogenic Needle     Needle Length: 9cm 9 cm Needle Gauge: 21 and 21 G    Additional Needles:  Procedures: ultrasound guided (picture in chart) Adductor canal block Narrative:  Start time: 04/18/2015 10:11 AM End time: 04/18/2015 10:21 AM Injection made incrementally with aspirations every 5 mL.  Performed by: Personally  Anesthesiologist: HODIERNE, ADAM  Additional Notes: Pt tolerated the procedure well.   Procedure Name: Intubation Date/Time: 04/18/2015 11:19 AM Performed by: Patrcia Dolly, Joanne Brander Pre-anesthesia Checklist: Patient identified, Patient being monitored, Timeout performed, Emergency Drugs available and Suction available Patient Re-evaluated:Patient Re-evaluated prior to inductionOxygen Delivery Method: Circle System Utilized Preoxygenation: Pre-oxygenation with 100% oxygen Intubation Type: IV induction Ventilation: Mask ventilation without difficulty Laryngoscope Size: Miller and 2 Grade View: Grade I Tube type: Oral Tube size: 7.0 mm Number of attempts: 1 Airway Equipment and Method: Stylet Placement Confirmation: ETT inserted through vocal cords under direct vision,  positive ETCO2 and breath sounds checked- equal and bilateral Secured at: 21 cm Tube secured with: Tape Dental Injury: Teeth and Oropharynx as per pre-operative assessment

## 2015-04-18 NOTE — Progress Notes (Signed)
Utilization review completed.  

## 2015-04-19 ENCOUNTER — Encounter (HOSPITAL_COMMUNITY): Payer: Self-pay | Admitting: Orthopedic Surgery

## 2015-04-19 DIAGNOSIS — R000 Tachycardia, unspecified: Secondary | ICD-10-CM

## 2015-04-19 LAB — BASIC METABOLIC PANEL
Anion gap: 5 (ref 5–15)
BUN: 14 mg/dL (ref 6–20)
CHLORIDE: 99 mmol/L — AB (ref 101–111)
CO2: 30 mmol/L (ref 22–32)
Calcium: 8.7 mg/dL — ABNORMAL LOW (ref 8.9–10.3)
Creatinine, Ser: 1.58 mg/dL — ABNORMAL HIGH (ref 0.44–1.00)
GFR calc Af Amer: 37 mL/min — ABNORMAL LOW (ref >60)
GFR calc non Af Amer: 32 mL/min — ABNORMAL LOW (ref >60)
Glucose, Bld: 109 mg/dL — ABNORMAL HIGH (ref 65–99)
POTASSIUM: 4.5 mmol/L (ref 3.5–5.1)
Sodium: 134 mmol/L — ABNORMAL LOW (ref 135–145)

## 2015-04-19 LAB — CBC
HCT: 31.3 % — ABNORMAL LOW (ref 36.0–46.0)
HEMOGLOBIN: 10 g/dL — AB (ref 12.0–15.0)
MCH: 30.3 pg (ref 26.0–34.0)
MCHC: 31.9 g/dL (ref 30.0–36.0)
MCV: 94.8 fL (ref 78.0–100.0)
PLATELETS: 242 10*3/uL (ref 150–400)
RBC: 3.3 MIL/uL — AB (ref 3.87–5.11)
RDW: 12.7 % (ref 11.5–15.5)
WBC: 9.2 10*3/uL (ref 4.0–10.5)

## 2015-04-19 LAB — PROTIME-INR
INR: 1.17 (ref 0.00–1.49)
Prothrombin Time: 15.1 seconds (ref 11.6–15.2)

## 2015-04-19 MED ORDER — WARFARIN VIDEO
Freq: Once | Status: AC
  Administered 2015-04-19: 10:00:00

## 2015-04-19 MED ORDER — MUPIROCIN 2 % EX OINT
1.0000 "application " | TOPICAL_OINTMENT | Freq: Two times a day (BID) | CUTANEOUS | Status: DC
  Administered 2015-04-19 – 2015-04-20 (×2): 1 via NASAL
  Filled 2015-04-19: qty 22

## 2015-04-19 MED ORDER — WARFARIN - PHARMACIST DOSING INPATIENT: Freq: Every day | Status: DC

## 2015-04-19 MED ORDER — PATIENT'S GUIDE TO USING COUMADIN BOOK
Freq: Once | Status: AC
  Administered 2015-04-19: 10:00:00
  Filled 2015-04-19: qty 1

## 2015-04-19 MED ORDER — WARFARIN SODIUM 5 MG PO TABS
5.0000 mg | ORAL_TABLET | ORAL | Status: AC
  Administered 2015-04-19: 5 mg via ORAL
  Filled 2015-04-19: qty 1

## 2015-04-19 MED ORDER — CHLORHEXIDINE GLUCONATE CLOTH 2 % EX PADS
6.0000 | MEDICATED_PAD | Freq: Every day | CUTANEOUS | Status: DC
  Administered 2015-04-20: 6 via TOPICAL

## 2015-04-19 NOTE — Care Management Note (Signed)
Case Management Note  Patient Details  Name: Jade Walker MRN: [id number] Date of Birth: 1946/08/22  Subjective/Objective:   Patient s/p l knee surgery, already set up with Genevieve Norlander for HHPT, HHRN (pt/inr), also set up with [employer] for CPM and has a rolling walker already.  The CPM machine will be delivered to patient' s home at discharge per [facility] with [employer]. NCM will cont to follow for dc needs.                 Action/Plan:   Expected Discharge Date:                  Expected Discharge Plan:  Home w Home Health Services  In-House Referral:     Discharge planning Services  CM Consult  Post Acute Care Choice:    Choice offered to:  Patient  DME Arranged:  CPM DME Agency:  [employer]  HH Arranged:  RN, PT HH Agency:  [facility]  Status of Service:  Completed, signed off  Medicare Important Message Given:    Date Medicare IM Given:    Medicare IM give by:    Date Additional Medicare IM Given:    Additional Medicare Important Message give by:     If discussed at Long Length of Stay Meetings, dates discussed:    Additional Comments:  Leone Haven, RN 04/19/2015, 12:10 PM

## 2015-04-19 NOTE — Progress Notes (Signed)
Patient Name: Jade Walker Date of Encounter: 04/19/2015   SUBJECTIVE  Denies chest pain, sob or palpitation.   CURRENT MEDS . antiseptic oral rinse  7 mL Mouth Rinse BID  . citalopram  10 mg Oral Daily  . docusate sodium  100 mg Oral BID  . losartan  50 mg Oral Daily   And  . hydrochlorothiazide  12.5 mg Oral Daily  . levothyroxine  25 mcg Oral QAC breakfast  . loratadine  10 mg Oral Daily  . mometasone-formoterol  2 puff Inhalation BID  . pantoprazole  40 mg Oral Daily  . vancomycin  1,000 mg Intravenous To SS-Surg  . [START ON 04/24/2015] Vitamin D (Ergocalciferol)  50,000 Units Oral Q7 days  . warfarin  5 mg Oral NOW  . warfarin   Does not apply Once  . Warfarin - Pharmacist Dosing Inpatient   Does not apply q1800    OBJECTIVE  Filed Vitals:   04/18/15 1845 04/18/15 2258 04/19/15 0204 04/19/15 0542  BP: 115/63 98/52 93/58  108/49  Pulse: 57 63 57 49  Temp: 98.5 F (36.9 C) 98.5 F (36.9 C) 98.6 F (37 C) 97.9 F (36.6 C)  TempSrc:  Oral Oral Oral  Resp: Height:      Weight:      SpO2: 98% 94% 100% 100%    Intake/Output Summary (Last 24 hours) at 04/19/15 1148 Last data filed at 04/19/15 0900  Gross per 24 hour  Intake   2560 ml  Output   1325 ml  Net   1235 ml   Filed Weights   04/18/15 0914  Weight: 169 lb 1.6 oz (76.703 kg)    PHYSICAL EXAM  General: Pleasant, NAD. Neuro: Alert and oriented X 3. Moves all extremities spontaneously. Psych: Normal affect. HEENT:  Normal  Neck: Supple without bruits or JVD. Lungs:  Resp regular and unlabored, CTA. Heart: RRR no s3, s4, or murmurs. Abdomen: Soft, non-tender, non-distended, BS + x 4.  Extremities: No clubbing, cyanosis or edema. DP 2+ and equal bilaterally. Postop left knee replacement.  Accessory Clinical Findings  CBC  Recent Labs  04/18/15 1012 04/19/15 0602  WBC  --  9.2  HGB 11.9* 10.0*  HCT 35.0* 31.3*  MCV  --  94.8  PLT  --  242   Basic Metabolic  Panel  Recent Labs  40/98/11 1012 04/19/15 0602  NA 135 134*  K 4.5 4.5  CL 99* 99*  CO2  --  30  GLUCOSE 85 109*  BUN 29* 14  CREATININE 1.70* 1.58*  CALCIUM  --  8.7*      Radiology/Studies  No results found.  ASSESSMENT AND PLAN  69 year old female with hypertension, left knee arthroplasty on 04/18/15 who developed transient supraventricular tachycardia with heart rate approximately 130 to 140 bpm lasting 1 minute's duration currently stable in normal rhythm.  1. Supraventricular tachycardia -brief transient supraventricular tachycardia in the setting of anesthesia induction. Unfortunately, no rhythm strip to evaluate. - Possible etiologies include paroxysmal atrial tachycardia, brief atrial fibrillation (although arrhythmia was not described as a regularly irregular), potential reentrant tachycardia however heart rate seems fairly slow for atypical reentrant tachycardia. Could also be transient sinus tachycardia however if episode occurred abruptly and terminated abruptly, this seems unlikely.  - She suppose to be on telemetry, however she never put on. Nurse did not know anything about this. Tentative plan to discharge her tomorrow. Will place her on tele until then.  - Currently  stable. She did felt dizzy and palpitations at home prior to admission. No syncope. Can place her on 30 days event monitor as outpatient if needed.  -  If necessary, may need beta blocker such as metoprolol tartrate 25 mg twice a day or calcium channel blocker such as diltiazem long-acting, CD 120 mg once a day. -  If these arrhythmias continue, one could consider TSH/echocardiogram.  2. Essential hypertension-currently well controlled. Continue current regimen.   3. Osteoarthritis left knee-status post arthroplasty, Dr. August Saucer  4. Chronic kidney disease stage III-previous creatinine in the 1.7 range. Avoid nephrotoxic agents. Creatinine improved to 1.58 today.   5. Anemia - Hgb of 10 today, was 11.9  yesterday. Continue to monitor.    Lorelei Pont PA-C Pager [phone] Agree with above.  She states that several years ago she had a stress test in [facility] and wore a monitor and nothing was found. Review of telemetry shows no arrhythmias today. Anticipate home in am. If she has recurrence of tachycardia as outpatient, an event monitor could be employed to document type of arrhythmia. Her last EKG was in June. Will update.

## 2015-04-19 NOTE — Op Note (Signed)
NAME:  Jade Walker, Jade [name]:  [id number]  MEDICAL RECORD NO.:  [id number]  LOCATION:  5W10C                        FACILITY:  [facility]  PHYSICIAN:  Burnard Bunting, M.D.    DATE OF BIRTH:  04/04/1946  DATE OF PROCEDURE: DATE OF DISCHARGE:                              OPERATIVE REPORT   PREOPERATIVE DIAGNOSIS:  Left knee osteoarthritis.  POSTOPERATIVE DIAGNOSIS:  Left knee osteoarthritis.  PROCEDURE:  Left total knee replacement using cemented [employer]ker Triathlon posterior cruciate retaining 2 femur, 3 tibia, 11-mm polyethylene insert, 29-mm offset, 3-pegged cemented patella.  SURGEON:  Burnard Bunting, M.D.  ASSISTANT:  Patrick Jupiter, RNFA.  INDICATION:  Jade Walker is a 69 year old patient with end-stage left knee arthritis, presents for operative management after explanation of risks and benefits.  PROCEDURE IN DETAIL:  The patient was brought to the operating room where general anesthetic was induced.  About 3 minutes after induction of general anesthesia and before any prepping and draping, the patient did have some type of cardiac arrhythmia.  This lasted about a minute. Her blood pressure did not drop and it was corrected by Anesthesia.  No further episodes occurred during the case.  Following this, then, the patient was hemodynamically stable.  Left knee was prescrubbed with alcohol and Betadine, allowed to air dry.  Prepped with DuraPrep solution and draped in a sterile manner.  [name] was used to cover the operative field.  Time-out was called.  Left leg was elevated and exsanguinated with Esmarch wrap.  Tourniquet was inflated.  Total tourniquet time 300 mmHg at 75 minutes.  Anterior approach to the knee was made.  Skin and subcutaneous tissue were sharply divided.  Median parapatellar approach was made and marked with #1 Vicryl suture. Lateral patellofemoral ligament released, fat pad partially excised. Soft tissue elevated off the anterior distal  femur, the proximal medial tibia soft tissue, removal was performed.  The patella was everted. Lateral patellofemoral ligament was released.  At this time, intramedullary alignment was used on the tibia to make a cut perpendicular to the mechanical axis of the tibia.  This was measured 2 mm off the most affected medial tibial plateau.  Intramedullary alignment was then used to make a cut on the femur, 5 degrees of valgus. An 8-mm cut was utilized.  With the 9-mm spacer in after the distal femoral cut was made, the leg came out in full extension.  The 4-in-1 cutting block was then used after the femur sized to a size 2. Collaterals were protected while the anterior, posterior and chamfer cuts were made.  Tibia was then keel punched to a size 3.  Trial components were placed.  Patella was then cut from 21-12 and 29-mm 3- pegged asymmetric patella was placed.  With trial components in position, the patient had best stability with an 11-mm poly spacer.  The patient had full range of motion, full extension, full flexion, excellent patellar tracking using no thumbs technique.  Good stability in varus and valgus stress at 0, 30 and 90 degrees.  PCL was maintained. Trial components were removed, 3 liters of irrigating solution utilized. Three minutes of tranexamic acid in the knee was performed at this time.  The knee was then again irrigated and the true components were cemented into position using size 11 spacer, which gave excellent stability and maintained the same stability parameters.  After cement dried, excess cement removed.  Thorough irrigation was again performed with another liter of irrigating solution.  Tourniquet was released.  Bleeding points were encountered and controlled using electrocautery.  The incision was then closed over bolster using #1 Vicryl suture to close the arthrotomy followed by interrupted inverted 0 Vicryl suture, 2-0 Vicryl suture and 3-0 Monocryl.  Steri-Strips  applied.  Solution of Marcaine, morphine, and clonidine injected into the knee.  Marcaine plain injected into the skin edges.  The patient tolerated the procedure well without immediate complication, transferred to the recovery room in stable condition. Cardiology consult obtained in order to facilitate management of her intraoperative arrhythmia.     Burnard Bunting, M.D.     GSD/MEDQ  D:  04/18/2015  T:  04/19/2015  Job:  [id number]

## 2015-04-19 NOTE — Progress Notes (Signed)
Subjective: Patient stable pain controlled no episodes of heart rhythm irregularities she is tolerating CPM   Objective: Vital signs in last 24 hours: Temp:  [97.9 F (36.6 C)-98.6 F (37 C)] 97.9 F (36.6 C) (08/10 0542) Pulse Rate:  [49-83] 49 (08/10 0542) Resp:  [9-23] 18 (08/10 0542) BP: (93-152)/(48-114) 108/49 mmHg (08/10 0542) SpO2:  [90 %-100 %] 100 % (08/10 0542) Weight:  [76.703 kg (169 lb 1.6 oz)] 76.703 kg (169 lb 1.6 oz) (08/09 0914)  Intake/Output from previous day: 08/09 0701 - 08/10 0700 In: 2420 [P.O.:120; I.V.:2300] Out: 1025 [Urine:975; Blood:50] Intake/Output this shift:    Exam:  Sensation intact distally Intact pulses distally Dorsiflexion/Plantar flexion intact  Labs:  Recent Labs  04/18/15 1003 04/18/15 1012 04/19/15 0602  HGB 12.2 11.9* 10.0*    Recent Labs  04/18/15 1012 04/19/15 0602  WBC  --  9.2  RBC  --  3.30*  HCT 35.0* 31.3*  PLT  --  242    Recent Labs  04/18/15 1012 04/19/15 0602  NA 135 134*  K 4.5 4.5  CL 99* 99*  CO2  --  30  BUN 29* 14  CREATININE 1.70* 1.58*  GLUCOSE 85 109*  CALCIUM  --  8.7*    Recent Labs  04/19/15 0602  INR 1.17    Assessment/Plan: Patient doing well plan CPM physical therapy today possible discharge tomorrow if she is progressing as I think she may well progress   DEAN,GREGORY SCOTT 04/19/2015, 7:58 AM

## 2015-04-19 NOTE — Evaluation (Signed)
Physical Therapy Evaluation Patient Details Name: Jade Walker MRN: [id number] DOB: 09/16/1945 Today's Date: 04/19/2015   History of Present Illness  69 y.o. s/p left TKA.  Clinical Impression  Pt admitted with above diagnosis and presents to PT with functional limitations due to deficits listed below (See PT problem list). Pt needs skilled PT to maximize independence and safety to allow discharge to home.     Follow Up Recommendations Home health PT;Supervision - Intermittent    Equipment Recommendations  None recommended by PT    Recommendations for Other Services       Precautions / Restrictions Precautions Precautions: Fall;Knee Precaution Booklet Issued: No Precaution Comments: educated on knee precautions Required Braces or Orthoses: Knee Immobilizer - Left Restrictions Weight Bearing Restrictions: Yes LLE Weight Bearing: Weight bearing as tolerated      Mobility  Bed Mobility Overal bed mobility: Needs Assistance Bed Mobility: Supine to Sit;Sit to Supine     Supine to sit: Supervision Sit to supine: Supervision      Transfers Overall transfer level: Needs assistance Equipment used: Rolling walker (2 wheeled) Transfers: Sit to/from Stand Sit to Stand: Min guard            Ambulation/Gait Ambulation/Gait assistance: Supervision Ambulation Distance (Feet): 150 Feet Assistive device: Rolling walker (2 wheeled) Gait Pattern/deviations: Step-through pattern;Decreased stance time - left   Gait velocity interpretation: Below normal speed for age/gender General Gait Details: verbal cues not to step past walker  Stairs            Wheelchair Mobility    Modified Rankin (Stroke Patients Only)       Balance Overall balance assessment: No apparent balance deficits (not formally assessed)                                           Pertinent Vitals/Pain Pain Assessment: 0-10 Pain Score: 5  Pain Location: left knee Pain  Descriptors / Indicators: Aching (pulling) Pain Intervention(s): Repositioned;Monitored during session    Home Living Family/patient expects to be discharged to:: Private residence Living Arrangements: Spouse/significant other (other family members) Available Help at Discharge: Family;Available 24 hours/day Type of Home: House Home Access: Stairs to enter Entrance Stairs-Rails: Doctor, general practice of Steps: 4-5 Home Layout: Multi-level;Able to live on main level with bedroom/bathroom Home Equipment: Dan Humphreys - 2 wheels;Shower seat;Cane - single point;Adaptive equipment      Prior Function Level of Independence: Independent               Hand Dominance        Extremity/Trunk Assessment   Upper Extremity Assessment: Defer to OT evaluation           Lower Extremity Assessment: LLE deficits/detail   LLE Deficits / Details: good quad set, move against gravity     Communication   Communication: No difficulties  Cognition Arousal/Alertness: Awake/alert Behavior During Therapy: WFL for tasks assessed/performed Overall Cognitive Status: Within Functional Limits for tasks assessed                      General Comments      Exercises Total Joint Exercises Ankle Circles/Pumps: AROM;Both;10 reps;Supine Quad Sets: AROM;Left;10 reps;Supine Heel Slides: AAROM;Left;10 reps;Supine Straight Leg Raises: AAROM;Left;10 reps Knee Flexion: AAROM;5 reps;Seated Goniometric ROM: 5-75      Assessment/Plan    PT Assessment Patient needs continued PT services  PT Diagnosis  Difficulty walking;Acute pain   PT Problem List Decreased strength;Decreased range of motion;Decreased mobility;Decreased knowledge of use of DME  PT Treatment Interventions DME instruction;Gait training;Stair training;Functional mobility training;Therapeutic exercise;Patient/family education   PT Goals (Current goals can be found in the Care Plan section) Acute Rehab PT Goals Patient  Stated Goal: return home and get back to work PT Goal Formulation: With patient Time For Goal Achievement: 04/26/15 Potential to Achieve Goals: Good    Frequency 7X/week   Barriers to discharge        Co-evaluation               End of Session Equipment Utilized During Treatment: Left knee immobilizer Activity Tolerance: Patient tolerated treatment well Patient left: in bed;with call bell/phone within reach;with nursing/sitter in room;with family/visitor present Nurse Communication: Mobility status         Time: 1201-1221 PT Time Calculation (min) (ACUTE ONLY): 20 min   Charges:   PT Evaluation $Initial PT Evaluation Tier I: 1 Procedure     PT G Codes:        Aubry Tucholski May 05, 2015, 12:38 PM  [facility] PT [phone]

## 2015-04-19 NOTE — Progress Notes (Signed)
Physical Therapy Treatment Patient Details Name: Jade Walker MRN: [id number] DOB: 1946-03-10 Today's Date: 04/19/2015    History of Present Illness 69 y.o. s/p left TKA.    PT Comments    Patient continues to make good progress. Exercise handout left in room. Continue with current POC  Follow Up Recommendations  Home health PT;Supervision - Intermittent     Equipment Recommendations  None recommended by PT    Recommendations for Other Services       Precautions / Restrictions Precautions Precautions: Fall;Knee Precaution Booklet Issued: No Precaution Comments: educated on knee precautions Required Braces or Orthoses: Knee Immobilizer - Left Restrictions Weight Bearing Restrictions: Yes LLE Weight Bearing: Weight bearing as tolerated    Mobility  Bed Mobility Overal bed mobility: Needs Assistance Bed Mobility: Supine to Sit;Sit to Supine     Supine to sit: Supervision Sit to supine: Supervision      Transfers Overall transfer level: Needs assistance Equipment used: Rolling walker (2 wheeled) Transfers: Sit to/from Stand Sit to Stand: Min guard         General transfer comment: Cues for safe hand placement  Ambulation/Gait Ambulation/Gait assistance: Supervision Ambulation Distance (Feet): 150 Feet Assistive device: Rolling walker (2 wheeled) Gait Pattern/deviations: Step-through pattern;Decreased stance time - left   Gait velocity interpretation: Below normal speed for age/gender General Gait Details: verbal cues not to step past walker as she tends to step before pushing RW.    Stairs            Wheelchair Mobility    Modified Rankin (Stroke Patients Only)       Balance Overall balance assessment: No apparent balance deficits (not formally assessed)                                  Cognition Arousal/Alertness: Awake/alert Behavior During Therapy: WFL for tasks assessed/performed Overall Cognitive Status: Within  Functional Limits for tasks assessed                      Exercises Total Joint Exercises Ankle Circles/Pumps: AROM;Both;10 reps;Supine Quad Sets: AROM;Left;10 reps;Supine Heel Slides: AAROM;Left;10 reps;Supine Straight Leg Raises: AAROM;Left;10 reps Knee Flexion: AAROM;5 reps;Seated Goniometric ROM: 5-75    General Comments        Pertinent Vitals/Pain Pain Assessment: 0-10 Pain Score: 5  Pain Location: L knee Pain Descriptors / Indicators: Aching;Sore Pain Intervention(s): Monitored during session    Home Living Family/patient expects to be discharged to:: Private residence Living Arrangements: Spouse/significant other (other family members) Available Help at Discharge: Family;Available 24 hours/day Type of Home: House Home Access: Stairs to enter Entrance Stairs-Rails: Right;Left Home Layout: Multi-level;Able to live on main level with bedroom/bathroom Home Equipment: Dan Humphreys - 2 wheels;Shower seat;Cane - single point;Adaptive equipment      Prior Function Level of Independence: Independent          PT Goals (current goals can now be found in the care plan section) Acute Rehab PT Goals Patient Stated Goal: return home and get back to work PT Goal Formulation: With patient Time For Goal Achievement: 04/26/15 Potential to Achieve Goals: Good Progress towards PT goals: Progressing toward goals    Frequency  7X/week    PT Plan Current plan remains appropriate    Co-evaluation             End of Session Equipment Utilized During Treatment: Left knee immobilizer Activity Tolerance: Patient tolerated treatment  well Patient left: in bed;with call bell/phone within reach;with family/visitor present     Time: 1610-9604 PT Time Calculation (min) (ACUTE ONLY): 26 min  Charges:  $Gait Training: 8-22 mins $Therapeutic Exercise: 8-22 mins                    G Codes:      Fredrich Birks 04/19/2015, 2:03 PM 04/19/2015 Fredrich Birks PTA [phone] pager [phone] office

## 2015-04-19 NOTE — Progress Notes (Signed)
ANTICOAGULATION CONSULT NOTE - Initial Consult  Pharmacy Consult for Coumadin Indication: VTE prophylaxis  Allergies  Allergen Reactions  . Codeine Other (See Comments)    Large doses make her sweat, heart flutter  . Sulfamethoxazole-Trimethoprim Rash    Patient Measurements: Height:  (167.6 cm) Weight: 169 lb 1.6 oz (76.703 kg) IBW/kg (Calculated) : 59.3  Vital Signs: Temp: 97.9 F (36.6 C) (08/10 0542) Temp Source: Oral (08/10 0542) BP: 108/49 mmHg (08/10 0542) Pulse Rate: 49 (08/10 0542)  Labs:  Recent Labs  04/18/15 1003 04/18/15 1012 04/19/15 0602  HGB 12.2 11.9* 10.0*  HCT 36.0 35.0* 31.3*  PLT  --   --  242  LABPROT  --   --  15.1  INR  --   --  1.17  CREATININE  --  1.70* 1.58*    Estimated Creatinine Clearance: 35.2 mL/min (by C-G formula based on Cr of 1.58).   Medical History: Past Medical History  Diagnosis Date  . GERD (gastroesophageal reflux disease)   . Anxiety   . Stress incontinence     from Care Everywhere  . History of hiatal hernia   . Hypertension   . Asthma   . Pneumonia   . Osteoporosis   . Hypothyroidism   . Arthritis     DDD - lumbar (from Care Everywhere), osteoarthritis  . Full dentures   . Environmental and seasonal allergies   . Complication of anesthesia     blood loss after back surgery because hernia was scratched and "torn" during intubation (02/15/11 EGD showed GIB d/t esophageal ulcer)  . Shortness of breath dyspnea     climbing steps  . History of kidney stones   . Headache     sinus  . Iron deficiency anemia     from Care Everywhere    Assessment: 69 year old female s/p TKA to begin Coumadin for VTE prophylaxis.  Goal of Therapy:  INR 2-3 Monitor platelets by anticoagulation protocol: Yes   Plan:  Coumadin 5 mg po x 1 dose now Daily INR Coumadin book and video  Thank you. Okey Regal, PharmD [phone]  04/19/2015,9:58 AM

## 2015-04-19 NOTE — Evaluation (Addendum)
Occupational Therapy Evaluation Patient Details Name: ASPYNN CLOVER MRN: [id number] DOB: Jan 08, 1946 Today's Date: 04/19/2015    History of Present Illness 69 y.o. s/p left TKA.   Clinical Impression   Pt s/p above. Education provided in session and pt will have family to assist at home. OT signing off.    Follow Up Recommendations  No OT follow up;Supervision - Intermittent    Equipment Recommendations  None recommended by OT    Recommendations for Other Services       Precautions / Restrictions Precautions Precautions: Fall;Knee Precaution Booklet Issued: No Precaution Comments: educated on knee precautions Required Braces or Orthoses: Knee Immobilizer - Left Restrictions Weight Bearing Restrictions: Yes LLE Weight Bearing: Weight bearing as tolerated      Mobility Bed Mobility Overal bed mobility: Needs Assistance Bed Mobility: Supine to Sit     Supine to sit: Supervision        Transfers Overall transfer level: Needs assistance   Transfers: Sit to/from Stand Sit to Stand: Min guard              Balance    Min guard for ambulation with RW.                                        ADL Overall ADL's : Needs assistance/impaired                     Lower Body Dressing: Moderate assistance;Sit to/from stand   Toilet Transfer: Min guard;Ambulation;RW (sit to stand from bed)           Functional mobility during ADLs: Min guard;Rolling walker General ADL Comments: Educated on LB dressing technique. Discussed  AE. Pt reports family can assist with ADLs. Educated on safety such as safe footwear, use of bag on walker, sitting for LB ADLs, rugs/items on floor. Recommended pt not step over tub yet and educated on tub transfer techniques/DME. Pt plans to sponge bathe for now. Explained how bending to left foot to reach sock is beneficial in that it allows knee to bend, but also talked about how pt has pain in back when bending  over, so AE may be helpful, but sounds like pt is going to let family assist. Explained to not have lower part of bed in bent position. Placed pt in bone foam at end of session.     Vision     Perception     Praxis      Pertinent Vitals/Pain Pain Assessment: 0-10 Pain Score: 5  Pain Location: left knee Pain Descriptors / Indicators: Aching (pulling) Pain Intervention(s): Repositioned;Monitored during session   O2 dropping in 70s on RA, but trended up to 90s nicely (pt may have been holding her breath)     Hand Dominance     Extremity/Trunk Assessment Upper Extremity Assessment Upper Extremity Assessment: Overall WFL for tasks assessed (previous left wrist surgery)   Lower Extremity Assessment Lower Extremity Assessment: Defer to PT evaluation       Communication Communication Communication: No difficulties   Cognition Arousal/Alertness: Awake/alert Behavior During Therapy: WFL for tasks assessed/performed Overall Cognitive Status: Within Functional Limits for tasks assessed                     General Comments       Exercises       Shoulder Instructions  Home Living Family/patient expects to be discharged to:: Private residence Living Arrangements: Spouse/significant other (other family members) Available Help at Discharge: Family;Available 24 hours/day Type of Home: House Home Access: Stairs to enter [employer] of Steps: 4-5 Entrance Stairs-Rails: Right;Left Home Layout: Multi-level;Able to live on main level with bedroom/bathroom     Bathroom Shower/Tub: Tub/shower unit   Bathroom Toilet: Handicapped height (has sink close)     Home Equipment: Environmental consultant - 2 wheels;Shower seat;Cane - single point;Adaptive equipment Adaptive Equipment: Reacher;Sock aid;Long-handled shoe horn        Prior Functioning/Environment Level of Independence: Independent             OT Diagnosis: Acute pain   OT Problem List:     OT  Treatment/Interventions:      OT Goals(Current goals can be found in the care plan section)    OT Frequency:     Barriers to D/C:            Co-evaluation              End of Session Equipment Utilized During Treatment: Gait belt;Rolling walker;Left knee immobilizer (Oxygen taken off towards beginning of session) CPM Left Knee CPM Left Knee: Off Nurse Communication: Other (comment) (do not have bottom of bed elevated; knee immobilizer on when standing; +1 assist)  Activity Tolerance: Patient tolerated treatment well Patient left: in chair;with call bell/phone within reach   Time: 0912-0935 OT Time Calculation (min): 23 min Charges:  OT General Charges $OT Visit: 1 Procedure OT Evaluation $Initial OT Evaluation Tier I: 1 Procedure G-CodesEarlie Raveling  OTR/L [id number]  04/19/2015, 10:24 AM

## 2015-04-20 DIAGNOSIS — M1710 Unilateral primary osteoarthritis, unspecified knee: Secondary | ICD-10-CM

## 2015-04-20 LAB — CBC
HCT: 31.1 % — ABNORMAL LOW (ref 36.0–46.0)
Hemoglobin: 10 g/dL — ABNORMAL LOW (ref 12.0–15.0)
MCH: 30 pg (ref 26.0–34.0)
MCHC: 32.2 g/dL (ref 30.0–36.0)
MCV: 93.4 fL (ref 78.0–100.0)
PLATELETS: 221 10*3/uL (ref 150–400)
RBC: 3.33 MIL/uL — ABNORMAL LOW (ref 3.87–5.11)
RDW: 12.8 % (ref 11.5–15.5)
WBC: 10.3 10*3/uL (ref 4.0–10.5)

## 2015-04-20 LAB — PROTIME-INR
INR: 1.29 (ref 0.00–1.49)
Prothrombin Time: 16.2 seconds — ABNORMAL HIGH (ref 11.6–15.2)

## 2015-04-20 MED ORDER — RIVAROXABAN 10 MG PO TABS: 10.0000 mg | ORAL_TABLET | Freq: Every day | ORAL | Status: DC

## 2015-04-20 MED ORDER — RIVAROXABAN (XARELTO) EDUCATION KIT FOR DVT/PE PATIENTS
PACK | Freq: Once | Status: AC
  Administered 2015-04-20: 09:00:00
  Filled 2015-04-20: qty 1

## 2015-04-20 MED ORDER — OXYCODONE HCL 5 MG PO TABS: 5.0000 mg | ORAL_TABLET | ORAL | Status: DC | PRN

## 2015-04-20 MED ORDER — RIVAROXABAN 10 MG PO TABS
10.0000 mg | ORAL_TABLET | Freq: Every day | ORAL | Status: DC
  Administered 2015-04-20: 10 mg via ORAL
  Filled 2015-04-20: qty 1

## 2015-04-20 MED ORDER — DOCUSATE SODIUM 100 MG PO CAPS: 100.0000 mg | ORAL_CAPSULE | Freq: Two times a day (BID) | ORAL | Status: DC

## 2015-04-20 NOTE — Progress Notes (Signed)
Jade Walker   Patient Name: Jade Walker Date of Encounter: 04/20/2015   SUBJECTIVE  Denies chest pain, sob or palpitation.   CURRENT MEDS . antiseptic oral rinse  7 mL Mouth Rinse BID  . Chlorhexidine Gluconate Cloth  6 each Topical Q0600  . citalopram  10 mg Oral Daily  . docusate sodium  100 mg Oral BID  . losartan  50 mg Oral Daily   And  . hydrochlorothiazide  12.5 mg Oral Daily  . levothyroxine  25 mcg Oral QAC breakfast  . loratadine  10 mg Oral Daily  . mometasone-formoterol  2 puff Inhalation BID  . mupirocin ointment  1 application Nasal BID  . pantoprazole  40 mg Oral Daily  . rivaroxaban   Does not apply Once  . rivaroxaban  10 mg Oral Daily  . [START ON 04/24/2015] Vitamin D (Ergocalciferol)  50,000 Units Oral Q7 days    OBJECTIVE  Filed Vitals:   04/19/15 0542 04/19/15 1335 04/19/15 2121 04/20/15 0534  BP: 108/49 97/54 124/57 115/51  Pulse: 49 64 73 74  Temp: 97.9 F (36.6 C) 98.4 F (36.9 C) 99.4 F (37.4 C) 99.5 F (37.5 C)  TempSrc: Oral Oral Oral Oral  Resp: Height:      Weight:      SpO2: 100% 95% 97% 98%    Intake/Output Summary (Last 24 hours) at 04/20/15 1151 Last data filed at 04/20/15 0300  Gross per 24 hour  Intake   1395 ml  Output   1370 ml  Net     25 ml   Filed Weights   04/18/15 0914  Weight: 169 lb 1.6 oz (76.703 kg)    PHYSICAL EXAM  General: Pleasant, NAD. Neuro: Alert and oriented X 3. Moves all extremities spontaneously. Psych: Normal affect. HEENT:  Normal  Neck: Supple without bruits or JVD. Lungs:  Resp regular and unlabored, CTA. Heart: RRR no s3, s4, or murmurs. Abdomen: Soft, non-tender, non-distended, BS + x 4.  Extremities: No clubbing, cyanosis or edema. DP 2+ and equal bilaterally. Postop left knee replacement.  Accessory Clinical Findings  CBC  Recent Labs  04/19/15 0602 04/20/15 0526  WBC 9.2 10.3  HGB 10.0* 10.0*  HCT 31.3* 31.1*  MCV 94.8 93.4  PLT 242 221   Basic Metabolic  Panel  Recent Labs  74/25/95 1012 04/19/15 0602  NA 135 134*  K 4.5 4.5  CL 99* 99*  CO2  --  30  GLUCOSE 85 109*  BUN 29* 14  CREATININE 1.70* 1.58*  CALCIUM  --  8.7*      Tele: NSR with few PVCS.    ASSESSMENT AND PLAN  69 year old female with hypertension, left knee arthroplasty on 04/18/15 who developed transient supraventricular tachycardia with heart rate approximately 130 to 140 bpm lasting 1 minute's duration currently stable in normal rhythm.  1. Supraventricular tachycardia -brief transient supraventricular tachycardia in the setting of anesthesia induction. Unfortunately, no rhythm strip to evaluate. - Possible etiologies include paroxysmal atrial tachycardia, brief atrial fibrillation (although arrhythmia was not described as a regularly irregular), potential reentrant tachycardia however heart rate seems fairly slow for atypical reentrant tachycardia. Could also be transient sinus tachycardia however if episode occurred abruptly and terminated abruptly, this seems unlikely.  - No arrhythmias noted on tele over night.  - If necessary, may need beta blocker such as metoprolol tartrate 25 mg twice a day or calcium channel blocker such as diltiazem long-acting, CD 120 mg once a  day.  2. Essential hypertension-currently well controlled. Continue current regimen.   3. Osteoarthritis left knee-status post arthroplasty, Dr. August Saucer  4. Chronic kidney disease stage III-previous creatinine in the 1.7 range. Avoid nephrotoxic agents. Creatinine improved to 1.58 today.   5. Anemia - Hgb stable.   Dipso: Will f/u in clinic in 4-6 weeks. If she has recurrence of tachycardia as outpatient, will need event monitor.   Lorelei Pont PA-C Pager [phone]

## 2015-04-20 NOTE — Care Management Note (Signed)
Case Management Note  Patient Details  Name: Jade Walker MRN: [id number] Date of Birth: 31-Mar-1946  Subjective/Objective:     NCM received call from [facility] with Genevieve Norlander stating patient 's insurance is out of network , patient has agreed to go with [facility] who is in network , referral [facility] to [facility]  For HHPT, patient already has her DME , [employer] will deliver the CPM today to her at home.  Soc will begin 24-48hrs post dc.              Action/Plan:   Expected Discharge Date:                  Expected Discharge Plan:  Home w Home Health Services  In-House Referral:     Discharge planning Services  CM Consult  Post Acute Care Choice:  Home Health Choice offered to:  Patient  DME Arranged:  CPM DME Agency:  [employer]  HH Arranged:  RN, PT HH Agency:  [facility]  Status of Service:  Completed, signed off  Medicare Important Message Given:    Date Medicare IM Given:    Medicare IM give by:    Date Additional Medicare IM Given:    Additional Medicare Important Message give by:     If discussed at Long Length of Stay Meetings, dates discussed:    Additional Comments:  Leone Haven, RN 04/20/2015, 11:27 AM

## 2015-04-20 NOTE — Progress Notes (Signed)
Physical Therapy Treatment Patient Details Name: Jade Walker MRN: [id number] DOB: 02/24/1946 Today's Date: 04/20/2015    History of Present Illness 69 y.o. s/p left TKA.    PT Comments    Pt is POD #2 and is ready for d/c.  Husband and wife demonstrated safety with stair practice simulating home entry.  HEP reviewed completely as well as knee precautions.  Plan is to d/c home this afternoon. All further PT to be deferred to [facility] care.   Follow Up Recommendations  Home health PT;Supervision for mobility/OOB     Equipment Recommendations  None recommended by PT    Recommendations for Other Services   NA     Precautions / Restrictions Precautions Precautions: Fall;Knee Precaution Booklet Issued: Yes (comment) Precaution Comments: educated on knee precautions Required Braces or Orthoses: Knee Immobilizer - Left Restrictions LLE Weight Bearing: Weight bearing as tolerated    Mobility  Bed Mobility Overal bed mobility: Needs Assistance Bed Mobility: Supine to Sit     Supine to sit: Modified independent (Device/Increase time);HOB elevated     General bed mobility comments: HOB elevated minimally and pt using railing for leverage  Transfers Overall transfer level: Needs assistance Equipment used: Rolling walker (2 wheeled) Transfers: Sit to/from Stand Sit to Stand: Min guard         General transfer comment: min guard assit for safety  Ambulation/Gait Ambulation/Gait assistance: Min guard Ambulation Distance (Feet): 20 Feet Assistive device: Rolling walker (2 wheeled) Gait Pattern/deviations: Step-to pattern;Antalgic   Gait velocity interpretation: Below normal speed for age/gender General Gait Details: verbal cues to reinforce LE sequencing.    Stairs Stairs: Yes Stairs assistance: Min guard Stair Management: One rail Left;Step to pattern;Sideways Number of Stairs: 4 General stair comments: Educated pt and husband re: correct LE sequeincing and  guarding technique.  They were able to demonstrate safety on stairs.  Husband provided min guard assist.          Balance Overall balance assessment: Needs assistance Sitting-balance support: Feet supported;No upper extremity supported Sitting balance-Leahy Scale: Good     Standing balance support: Bilateral upper extremity supported;No upper extremity supported;Single extremity supported Standing balance-Leahy Scale: Fair                      Cognition Arousal/Alertness: Awake/alert Behavior During Therapy: WFL for tasks assessed/performed Overall Cognitive Status: Within Functional Limits for tasks assessed                      Exercises Total Joint Exercises Ankle Circles/Pumps: AROM;Both;10 reps;Supine Quad Sets: AROM;Left;10 reps;Supine Towel Squeeze: AROM;Both;10 reps;Supine Short Arc Quad: AAROM;Left;10 reps;Supine Heel Slides: AAROM;Left;10 reps;Supine Hip ABduction/ADduction: AROM;Left;10 reps;Supine Straight Leg Raises: AAROM;Left;10 reps Knee Flexion: AAROM;Seated;10 reps;AROM Goniometric ROM: 5-70        Pertinent Vitals/Pain Pain Assessment: 0-10 Pain Score: 4  Pain Location: left knee Pain Descriptors / Indicators: Aching;Burning Pain Intervention(s): Limited activity within patient's tolerance;Monitored during session;Repositioned;Patient requesting pain meds-RN notified           PT Goals (current goals can now be found in the care plan section) Acute Rehab PT Goals Patient Stated Goal: return home and get back to work Progress towards PT goals: Progressing toward goals    Frequency  BID    PT Plan Current plan remains appropriate       End of Session Equipment Utilized During Treatment: Left knee immobilizer Activity Tolerance: Patient limited by fatigue;Patient limited by pain Patient left: in  chair;with call bell/phone within reach;with family/visitor present     Time: 1014-1101 PT Time Calculation (min) (ACUTE  ONLY): 47 min  Charges:  $Gait Training: 23-37 mins $Therapeutic Exercise: 8-22 mins                      Janard Culp B. Mckenzie Bove, PT, DPT [phone]   04/20/2015, 12:33 PM

## 2015-04-20 NOTE — Care Management Important Message (Signed)
Important Message  Patient Details  Name: Jade Walker MRN: [id number] Date of Birth: August 31, 1946   Medicare Important Message Given:  Yes-second notification given    Leone Haven, RN 04/20/2015, 11:43 AMImportant Message  Patient Details  Name: Jade Walker MRN: [id number] Date of Birth: August 07, 1946   Medicare Important Message Given:  Yes-second notification given    Leone Haven, RN 04/20/2015, 11:43 AM

## 2015-04-20 NOTE — Progress Notes (Signed)
ANTICOAGULATION CONSULT NOTE  Pharmacy Consult for Coumadin to Xarelto Indication: VTE prophylaxis  Allergies  Allergen Reactions  . Codeine Other (See Comments)    Large doses make her sweat, heart flutter  . Sulfamethoxazole-Trimethoprim Rash    Patient Measurements: Height:  (167.6 cm) Weight: 169 lb 1.6 oz (76.703 kg) IBW/kg (Calculated) : 59.3  Vital Signs: Temp: 99.5 F (37.5 C) (08/11 0534) Temp Source: Oral (08/11 0534) BP: 115/51 mmHg (08/11 0534) Pulse Rate: 74 (08/11 0534)  Labs:  Recent Labs  04/18/15 1012 04/19/15 0602 04/20/15 0526  HGB 11.9* 10.0* 10.0*  HCT 35.0* 31.3* 31.1*  PLT  --  242 221  LABPROT  --  15.1  --   INR  --  1.17  --   CREATININE 1.70* 1.58*  --     Estimated Creatinine Clearance: 35.2 mL/min (by C-G formula based on Cr of 1.58).   Medical History: Past Medical History  Diagnosis Date  . GERD (gastroesophageal reflux disease)   . Anxiety   . Stress incontinence     from Care Everywhere  . History of hiatal hernia   . Hypertension   . Asthma   . Pneumonia   . Osteoporosis   . Hypothyroidism   . Arthritis     DDD - lumbar (from Care Everywhere), osteoarthritis  . Full dentures   . Environmental and seasonal allergies   . Complication of anesthesia     blood loss after back surgery because hernia was scratched and "torn" during intubation (02/15/11 EGD showed GIB d/t esophageal ulcer)  . Shortness of breath dyspnea     climbing steps  . History of kidney stones   . Headache     sinus  . Iron deficiency anemia     from Care Everywhere    Assessment: 69 year old female s/p TKA to begin Coumadin for VTE prophylaxis.  Pharmacy is consulted to transition patient from coumadin to xarelto for VTE prophylaxis following TKA. Hgb 10.0, Plt 221, sCr 1.57.  Goal of Therapy:  Monitor platelets by anticoagulation protocol: Yes   Plan:  Xarelto  PO daily Monitor s/sx of bleeding Educate patient on Xarelto  Arlean Hopping. Newman Pies, PharmD Clinical Pharmacist Pager [phone]  04/20/2015,8:16 AM

## 2015-04-20 NOTE — Progress Notes (Signed)
Patient discharge teaching given, including activity, diet, follow-up appoints, and medications. Patient verbalized understanding of all discharge instructions. IV access was d/c'd. Vitals are stable. Skin is intact except as charted in most recent assessments. Pt to be escorted out by NT, to be driven home by family. 

## 2015-04-20 NOTE — Progress Notes (Signed)
Subjective: Pt stable - doing well   Objective: Vital signs in last 24 hours: Temp:  [98.4 F (36.9 C)-99.5 F (37.5 C)] 99.5 F (37.5 C) (08/11 0534) Pulse Rate:  [64-74] 74 (08/11 0534) Resp:  [18] 18 (08/11 0534) BP: (97-124)/(51-57) 115/51 mmHg (08/11 0534) SpO2:  [95 %-98 %] 98 % (08/11 0534)  Intake/Output from previous day: 08/10 0701 - 08/11 0700 In: 1635 [P.O.:960; I.V.:675] Out: 2220 [Urine:2200; Emesis/NG output:20] Intake/Output this shift:    Exam:  Neurovascular intact Sensation intact distally Intact pulses distally Dorsiflexion/Plantar flexion intact  Labs:  Recent Labs  04/18/15 1003 04/18/15 1012 04/19/15 0602 04/20/15 0526  HGB 12.2 11.9* 10.0* 10.0*    Recent Labs  04/19/15 0602 04/20/15 0526  WBC 9.2 10.3  RBC 3.30* 3.33*  HCT 31.3* 31.1*  PLT 242 221    Recent Labs  04/18/15 1012 04/19/15 0602  NA 135 134*  K 4.5 4.5  CL 99* 99*  CO2  --  30  BUN 29* 14  CREATININE 1.70* 1.58*  GLUCOSE 85 109*  CALCIUM  --  8.7*    Recent Labs  04/19/15 0602  INR 1.17    Assessment/Plan: Plan dc today   DEAN,GREGORY SCOTT 04/20/2015, 7:54 AM

## 2015-05-05 NOTE — Discharge Summary (Signed)
Physician Discharge Summary  Patient ID: Jade Walker MRN: [id number] DOB/AGE: 09-Apr-1946 69 y.o.  Admit date: 04/18/2015 Discharge date: 04/20/2015  Admission Diagnoses:  Active Problems:   Degenerative arthritis of knee   Discharge Diagnoses:  Same  Surgeries: Procedure(s): TOTAL KNEE ARTHROPLASTY on 04/18/2015   Consultants:    Discharged Condition: Stable  Hospital Course: Jade Walker is an 69 y.o. female who was admitted 04/18/2015 with a chief complaint of knee pain, and found to have a diagnosis of knee arthritis.  They were brought to the operating room on 04/18/2015 and underwent the above named procedures. She tolerated surgery wel and made rapid progress with PT - dced home pod 2l    Antibiotics given:  Anti-infectives    Start     Dose/Rate Route Frequency Ordered Stop   04/19/15 1105  vancomycin (VANCOCIN) IVPB 1000 mg/200 mL premix     1,000 mg 200 mL/hr over 60 Minutes Intravenous To ShortStay Surgical 04/18/15 1923 04/19/15 1215   04/18/15 2300  vancomycin (VANCOCIN) IVPB 1000 mg/200 mL premix     1,000 mg 200 mL/hr over 60 Minutes Intravenous Every 12 hours 04/18/15 1924 04/18/15 2341   04/18/15 1100  ceFAZolin (ANCEF) IVPB 2 g/50 mL premix     2 g 100 mL/hr over 30 Minutes Intravenous On call to O.R. 04/18/15 0918 04/18/15 1125   04/18/15 1100  vancomycin (VANCOCIN) IVPB 1000 mg/200 mL premix     1,000 mg 200 mL/hr over 60 Minutes Intravenous  Once 04/18/15 1048 04/18/15 1158    .  Recent vital signs:  Filed Vitals:   04/20/15 0534  BP: 115/51  Pulse: 74  Temp: 99.5 F (37.5 C)  Resp: 18    Recent laboratory studies:  Results for orders placed or performed during the hospital encounter of 04/18/15  CBC  Result Value Ref Range   WBC 9.2 4.0 - 10.5 K/uL   RBC 3.30 (L) 3.87 - 5.11 MIL/uL   Hemoglobin 10.0 (L) 12.0 - 15.0 g/dL   HCT 95.6 (L) 21.3 - 08.6 %   MCV 94.8 78.0 - 100.0 fL   MCH 30.3 26.0 - 34.0 pg   MCHC 31.9 30.0 - 36.0 g/dL    RDW 57.8 46.9 - 62.9 %   Platelets 242 150 - 400 K/uL  Basic metabolic panel  Result Value Ref Range   Sodium 134 (L) 135 - 145 mmol/L   Potassium 4.5 3.5 - 5.1 mmol/L   Chloride 99 (L) 101 - 111 mmol/L   CO2 30 22 - 32 mmol/L   Glucose, Bld 109 (H) 65 - 99 mg/dL   BUN 14 6 - 20 mg/dL   Creatinine, Ser 5.28 (H) 0.44 - 1.00 mg/dL   Calcium 8.7 (L) 8.9 - 10.3 mg/dL   GFR calc non Af Amer 32 (L) >60 mL/min   GFR calc Af Amer 37 (L) >60 mL/min   Anion gap 5 5 - 15  Protime-INR  Result Value Ref Range   Prothrombin Time 15.1 11.6 - 15.2 seconds   INR 1.17 0.00 - 1.49  CBC  Result Value Ref Range   WBC 10.3 4.0 - 10.5 K/uL   RBC 3.33 (L) 3.87 - 5.11 MIL/uL   Hemoglobin 10.0 (L) 12.0 - 15.0 g/dL   HCT 41.3 (L) 24.4 - 01.0 %   MCV 93.4 78.0 - 100.0 fL   MCH 30.0 26.0 - 34.0 pg   MCHC 32.2 30.0 - 36.0 g/dL   RDW 27.2 53.6 - 64.4 %  Platelets 221 150 - 400 K/uL  Protime-INR  Result Value Ref Range   Prothrombin Time 16.2 (H) 11.6 - 15.2 seconds   INR 1.29 0.00 - 1.49  I-STAT 4, (NA,K, GLUC, HGB,HCT)  Result Value Ref Range   Sodium 136 135 - 145 mmol/L   Potassium 4.4 3.5 - 5.1 mmol/L   Glucose, Bld 86 65 - 99 mg/dL   HCT 53.6 64.4 - 03.4 %   Hemoglobin 12.2 12.0 - 15.0 g/dL  I-STAT, chem 8  Result Value Ref Range   Sodium 135 135 - 145 mmol/L   Potassium 4.5 3.5 - 5.1 mmol/L   Chloride 99 (L) 101 - 111 mmol/L   BUN 29 (H) 6 - 20 mg/dL   Creatinine, Ser 7.42 (H) 0.44 - 1.00 mg/dL   Glucose, Bld 85 65 - 99 mg/dL   Calcium, Ion 5.95 (L) 1.13 - 1.30 mmol/L   TCO2 26 0 - 100 mmol/L   Hemoglobin 11.9 (L) 12.0 - 15.0 g/dL   HCT 63.8 (L) 75.6 - 43.3 %    Discharge Medications:     Medication List    STOP taking these medications        meloxicam 7.5 MG tablet  Commonly known as:  MOBIC     oxyCODONE-acetaminophen 5-325 MG per tablet  Commonly known as:  PERCOCET/ROXICET      TAKE these medications        albuterol (2.5 MG/3ML) 0.083% nebulizer solution   Commonly known as:  PROVENTIL  Take 2.5 mg by nebulization every 6 (six) hours as needed for wheezing or shortness of breath. i puff once a day as needed     cetirizine 10 MG tablet  Commonly known as:  ZYRTEC  Take 10 mg by mouth daily.     citalopram 10 MG tablet  Commonly known as:  CELEXA  Take 10 mg by mouth daily.     dextromethorphan-guaiFENesin 30-600 MG per 12 hr tablet  Commonly known as:  MUCINEX DM  Take 1 tablet by mouth 2 (two) times daily as needed for cough (or congestion).     Diethylpropion HCl CR 75 MG Tb24  Take 75 mg by mouth daily.     docusate sodium 100 MG capsule  Commonly known as:  COLACE  Take 1 capsule (100 mg total) by mouth 2 (two) times daily.     Fluticasone-Salmeterol 250-50 MCG/DOSE Aepb  Commonly known as:  ADVAIR  Inhale 1 puff into the lungs 2 (two) times daily.     levothyroxine 25 MCG tablet  Commonly known as:  SYNTHROID, LEVOTHROID  Take 25 mcg by mouth daily before breakfast.     losartan-hydrochlorothiazide 50-12.5 MG per tablet  Commonly known as:  HYZAAR  Take 1 tablet by mouth daily.     omeprazole 40 MG capsule  Commonly known as:  PRILOSEC  Take 40 mg by mouth 2 (two) times daily.     oxyCODONE 5 MG immediate release tablet  Commonly known as:  Oxy IR/ROXICODONE  Take 1-2 tablets (5-10 mg total) by mouth every 3 (three) hours as needed for breakthrough pain.     rivaroxaban 10 MG Tabs tablet  Commonly known as:  XARELTO  Take 1 tablet (10 mg total) by mouth daily.     tiZANidine 4 MG tablet  Commonly known as:  ZANAFLEX  Take 4 mg by mouth every 8 (eight) hours as needed for muscle spasms.     Vitamin D (Ergocalciferol) 50000 UNITS Caps capsule  Commonly known as:  DRISDOL  Take 50,000 Units by mouth every 7 (seven) days. On Monday.        Diagnostic Studies: No results found.  Disposition: 01-Home or Self Care      Discharge Instructions    Call MD / Call 911    Complete by:  As directed   If you  experience chest pain or shortness of breath, CALL 911 and be transported to the hospital emergency room.  If you develope a fever above 101 F, pus (white drainage) or increased drainage or redness at the wound, or calf pain, call your surgeon's office.     Constipation Prevention    Complete by:  As directed   Drink plenty of fluids.  Prune juice may be helpful.  You may use a stool softener, such as Colace (over the counter) 100 mg twice a day.  Use MiraLax (over the counter) for constipation as needed.     Diet - low sodium heart healthy    Complete by:  As directed      Discharge instructions    Complete by:  As directed   INSTRUCTIONS AFTER JOINT REPLACEMENT   Remove items at home which could result in a fall. This includes throw rugs or furniture in walking pathways ICE to the affected joint every three hours while awake for 30 minutes at a time, for at least the first 3-5 days, and then as needed for pain and swelling.  Continue to use ice for pain and swelling. You may notice swelling that will progress down to the foot and ankle.  This is normal after surgery.  Elevate your leg when you are not up walking on it.   Continue to use the breathing machine you got in the hospital (incentive spirometer) which will help keep your temperature down.  It is common for your temperature to cycle up and down following surgery, especially at night when you are not up moving around and exerting yourself.  The breathing machine keeps your lungs expanded and your temperature down.   DIET:  As you were doing prior to hospitalization, we recommend a well-balanced diet.  DRESSING / WOUND CARE / SHOWERING  You may shower 3 days after surgery, but keep the wounds dry during showering.  You may use an occlusive plastic wrap (Press'n Seal for example), NO SOAKING/SUBMERGING IN THE BATHTUB.  If the bandage gets wet, change with a clean dry gauze.  If the incision gets wet, pat the wound dry with a clean  towel.  ACTIVITY  Increase activity slowly as tolerated, but follow the weight bearing instructions below.   No driving for 6 weeks or until further direction given by your physician.  You cannot drive while taking narcotics.  No lifting or carrying greater than 10 lbs. until further directed by your surgeon. Avoid periods of inactivity such as sitting longer than an hour when not asleep. This helps prevent blood clots.  You may return to work once you are authorized by your doctor.     WEIGHT BEARING   Weight bearing as tolerated with assist device (walker, cane, etc) as directed, use it as long as suggested by your surgeon or therapist, typically at least 4-6 weeks.   EXERCISES  Results after joint replacement surgery are often greatly improved when you follow the exercise, range of motion and muscle strengthening exercises prescribed by your doctor. Safety measures are also important to protect the joint from further injury. Any time any of  these exercises cause you to have increased pain or swelling, decrease what you are doing until you are comfortable again and then slowly increase them. If you have problems or questions, call your caregiver or physical therapist for advice.   Rehabilitation is important following a joint replacement. After just a few days of immobilization, the muscles of the leg can become weakened and shrink (atrophy).  These exercises are designed to build up the tone and strength of the thigh and leg muscles and to improve motion. Often times heat used for twenty to thirty minutes before working out will loosen up your tissues and help with improving the range of motion but do not use heat for the first two weeks following surgery (sometimes heat can increase post-operative swelling).   These exercises can be done on a training (exercise) mat, on the floor, on a table or on a bed. Use whatever works the best and is most comfortable for you.    Use music or  television while you are exercising so that the exercises are a pleasant break in your day. This will make your life better with the exercises acting as a break in your routine that you can look forward to.   Perform all exercises about fifteen times, three times per day or as directed.  You should exercise both the operative leg and the other leg as well.   Exercises include:   Quad Sets - Tighten up the muscle on the front of the thigh (Quad) and hold for 5-10 seconds.   Straight Leg Raises - With your knee straight (if you were given a brace, keep it on), lift the leg to 60 degrees, hold for 3 seconds, and slowly lower the leg.  Perform this exercise against resistance later as your leg gets stronger.  Leg Slides: Lying on your back, slowly slide your foot toward your buttocks, bending your knee up off the floor (only go as far as is comfortable). Then slowly slide your foot back down until your leg is flat on the floor again.  Angel Wings: Lying on your back spread your legs to the side as far apart as you can without causing discomfort.  Hamstring Strength:  Lying on your back, push your heel against the floor with your leg straight by tightening up the muscles of your buttocks.  Repeat, but this time bend your knee to a comfortable angle, and push your heel against the floor.  You may put a pillow under the heel to make it more comfortable if necessary.   A rehabilitation program following joint replacement surgery can speed recovery and prevent re-injury in the future due to weakened muscles. Contact your doctor or a physical therapist for more information on knee rehabilitation.    CONSTIPATION  Constipation is defined medically as fewer than three stools per week and severe constipation as less than one stool per week.  Even if you have a regular bowel pattern at home, your normal regimen is likely to be disrupted due to multiple reasons following surgery.  Combination of anesthesia,  postoperative narcotics, change in appetite and fluid intake all can affect your bowels.   YOU MUST use at least one of the following options; they are listed in order of increasing strength to get the job done.  They are all available over the counter, and you may need to use some, POSSIBLY even all of these options:    Drink plenty of fluids (prune juice may be helpful) and high  fiber foods Colace 100 mg by mouth twice a day  Senokot for constipation as directed and as needed Dulcolax (bisacodyl), take with full glass of water  Miralax (polyethylene glycol) once or twice a day as needed.  If you have tried all these things and are unable to have a bowel movement in the first 3-4 days after surgery call either your surgeon or your primary doctor.    If you experience loose stools or diarrhea, hold the medications until you stool forms back up.  If your symptoms do not get better within 1 week or if they get worse, check with your doctor.  If you experience "the worst abdominal pain ever" or develop nausea or vomiting, please contact the office immediately for further recommendations for treatment.   ITCHING:  If you experience itching with your medications, try taking only a single pain pill, or even half a pain pill at a time.  You can also use Benadryl over the counter for itching or also to help with sleep.   TED HOSE STOCKINGS:  Use stockings on both legs until for at least 2 weeks or as directed by physician office. They may be removed at night for sleeping.  MEDICATIONS:  See your medication summary on the "After Visit Summary" that nursing will review with you.  You may have some home medications which will be placed on hold until you complete the course of blood thinner medication.  It is important for you to complete the blood thinner medication as prescribed.  PRECAUTIONS:  If you experience chest pain or shortness of breath - call 911 immediately for transfer to the hospital emergency  department.   If you develop a fever greater that 101 F, purulent drainage from wound, increased redness or drainage from wound, foul odor from the wound/dressing, or calf pain - CONTACT YOUR SURGEON.                                                   FOLLOW-UP APPOINTMENTS:  If you do not already have a post-op appointment, please call the office for an appointment to be seen by your surgeon.  Guidelines for how soon to be seen are listed in your "After Visit Summary", but are typically between 1-4 weeks after surgery.  OTHER INSTRUCTIONS:   Knee Replacement:  Do not place pillow under knee, focus on keeping the knee straight while resting. CPM instructions: 0-90 degrees, 2 hours in the morning, 2 hours in the afternoon, and 2 hours in the evening. Place foam block, curve side up under heel at all times except when in CPM or when walking.  DO NOT modify, tear, cut, or change the foam block in any way.  MAKE SURE YOU:  Understand these instructions.  Get help right away if you are not doing well or get worse.    Thank you for letting us be a part of your medical care team.  It is a privilege we respect greatly.  We hope these instructions will help you stay on track for a fast and full recovery!     Increase activity slowly as tolerated    Complete by:  As directed            Follow-up Information    Follow up with [facility].   Why:  HHPT  Contact information:   [address] [place] [address] [phone]       Follow up with Jacolyn Reedy, PA-C On 05/22/2015.   Specialty:  Cardiology   Why:  @10 :45 post hospital   Contact information:   [address] [place] [address] [phone]        Signed: Cammy Copa 05/05/2015, 11:46 AM

## 2015-05-22 ENCOUNTER — Encounter: Payer: Medicare HMO | Admitting: Physician Assistant

## 2015-09-07 NOTE — H&P (Signed)
Jade Walker is an 69 y.o. female.    Chief Complaint: right shoulder pain  HPI: Pt is a 69 y.o. female complaining of right shoulder pain for multiple years. Pain had continually increased since the beginning. X-rays in the clinic show end-stage arthritic changes of the right shoulder. Pt has tried various conservative treatments which have failed to alleviate their symptoms, including injections and therapy. Various options are discussed with the patient. Risks, benefits and expectations were discussed with the patient. Patient understand the risks, benefits and expectations and wishes to proceed with surgery.   PCP:  No primary care provider on file.  D/C Plans: Home  PMH: Past Medical History  Diagnosis Date  . GERD (gastroesophageal reflux disease)   . Anxiety   . Stress incontinence     from Care Everywhere  . History of hiatal hernia   . Hypertension   . Asthma   . Pneumonia   . Osteoporosis   . Hypothyroidism   . Arthritis     DDD - lumbar (from Care Everywhere), osteoarthritis  . Full dentures   . Environmental and seasonal allergies   . Complication of anesthesia     blood loss after back surgery because hernia was scratched and "torn" during intubation (02/15/11 EGD showed GIB d/t esophageal ulcer)  . Shortness of breath dyspnea     climbing steps  . History of kidney stones   . Headache     sinus  . Iron deficiency anemia     from Care Everywhere    PSH: Past Surgical History  Procedure Laterality Date  . Eye surgery Bilateral     cataract surgery with lens implant  . Colonoscopy    . Tonsillectomy    . Cholecystectomy    . Abdominal hysterectomy    . Back surgery      lumbar x 3, 1 thoracic spine  . Open reduction internal fixation (orif) distal radial fracture Left 03/14/2015    Procedure: OPEN REDUCTION INTERNAL FIXATION (ORIF) LEFT DISTAL RADIUS FRACTURE;  Surgeon: Cammy Copa, MD;  Location: [facility] OR;  Service: Orthopedics;  Laterality: Left;    . Total knee arthroplasty Left 04/18/2015    Procedure: TOTAL KNEE ARTHROPLASTY;  Surgeon: Cammy Copa, MD;  Location: [facility] OR;  Service: Orthopedics;  Laterality: Left;    Social History:  reports that she has never smoked. She has never used smokeless tobacco. She reports that she does not drink alcohol or use illicit drugs.  Allergies:  Allergies  Allergen Reactions  . Codeine Other (See Comments)    Large doses make her sweat, heart flutter  . Sulfamethoxazole-Trimethoprim Rash    Medications: No current facility-administered medications for this encounter.   Current Outpatient Prescriptions  Medication Sig Dispense Refill  . albuterol (PROVENTIL) (2.5 MG/3ML) 0.083% nebulizer solution Take 2.5 mg by nebulization every 6 (six) hours as needed for wheezing or shortness of breath. i puff once a day as needed    . cetirizine (ZYRTEC) 10 MG tablet Take 10 mg by mouth daily.    . citalopram (CELEXA) 10 MG tablet Take 10 mg by mouth daily.    Marland Kitchen dextromethorphan-guaiFENesin (MUCINEX DM) 30-600 MG per 12 hr tablet Take 1 tablet by mouth 2 (two) times daily as needed for cough (or congestion).    . Diethylpropion HCl CR 75 MG TB24 Take 75 mg by mouth daily.    Marland Kitchen docusate sodium (COLACE) 100 MG capsule Take 1 capsule (100 mg total) by mouth 2 (  two) times daily. 10 capsule 0  . Fluticasone-Salmeterol (ADVAIR) 250-50 MCG/DOSE AEPB Inhale 1 puff into the lungs 2 (two) times daily.    Marland Kitchen. levothyroxine (SYNTHROID, LEVOTHROID) 25 MCG tablet Take 25 mcg by mouth daily before breakfast.    . losartan-hydrochlorothiazide (HYZAAR) 50-12.5 MG per tablet Take 1 tablet by mouth daily.  5  . omeprazole (PRILOSEC) 40 MG capsule Take 40 mg by mouth 2 (two) times daily.    Marland Kitchen. oxyCODONE (OXY IR/ROXICODONE) 5 MG immediate release tablet Take 1-2 tablets (5-10 mg total) by mouth every 3 (three) hours as needed for breakthrough pain. 60 tablet 0  . rivaroxaban (XARELTO) 10 MG TABS tablet Take 1 tablet (10 mg  total) by mouth daily. 12 tablet 0  . tiZANidine (ZANAFLEX) 4 MG tablet Take 4 mg by mouth every 8 (eight) hours as needed for muscle spasms.    . Vitamin D, Ergocalciferol, (DRISDOL) 50000 UNITS CAPS capsule Take 50,000 Units by mouth every 7 (seven) days. On Monday.      No results found for this or any previous visit (from the past 48 hour(s)). No results found.  ROS: Pain with rom of the right upper extremity  Physical Exam:  Alert and oriented 69 y.o. female in no acute distress Cranial nerves 2-12 intact Cervical spine: full rom with no tenderness, nv intact distally Chest: active breath sounds bilaterally, no wheeze rhonchi or rales Heart: regular rate and rhythm, no murmur Abd: non tender non distended with active bowel sounds Hip is stable with rom  Right shoulder with limited rom due to pain Crepitus with rom nv intact distally No rashes or edema  Assessment/Plan Assessment: right shoulder end stage osteoarthritis  Plan: Patient will undergo a right total shoulder arthroplasty by Dr. Ranell PatrickNorris [facility]. Risks benefits and expectations were discussed with the patient. Patient understand risks, benefits and expectations and wishes to proceed.

## 2015-09-27 ENCOUNTER — Other Ambulatory Visit (HOSPITAL_COMMUNITY): Payer: Self-pay | Admitting: *Deleted

## 2015-09-28 ENCOUNTER — Encounter (HOSPITAL_COMMUNITY)
Admission: RE | Admit: 2015-09-28 | Discharge: 2015-09-28 | Disposition: A | Discharge status: Home / Self Care | Payer: Worker's Compensation | Source: Ambulatory Visit | Attending: Orthopedic Surgery | Admitting: Orthopedic Surgery

## 2015-09-28 ENCOUNTER — Encounter (HOSPITAL_COMMUNITY): Payer: Self-pay

## 2015-09-28 DIAGNOSIS — Z79899 Other long term (current) drug therapy: Secondary | ICD-10-CM | POA: Diagnosis not present

## 2015-09-28 DIAGNOSIS — J45909 Unspecified asthma, uncomplicated: Secondary | ICD-10-CM | POA: Insufficient documentation

## 2015-09-28 DIAGNOSIS — D509 Iron deficiency anemia, unspecified: Secondary | ICD-10-CM | POA: Insufficient documentation

## 2015-09-28 DIAGNOSIS — Z96652 Presence of left artificial knee joint: Secondary | ICD-10-CM | POA: Insufficient documentation

## 2015-09-28 DIAGNOSIS — I129 Hypertensive chronic kidney disease with stage 1 through stage 4 chronic kidney disease, or unspecified chronic kidney disease: Secondary | ICD-10-CM | POA: Insufficient documentation

## 2015-09-28 DIAGNOSIS — N183 Chronic kidney disease, stage 3 (moderate): Secondary | ICD-10-CM | POA: Diagnosis not present

## 2015-09-28 DIAGNOSIS — K219 Gastro-esophageal reflux disease without esophagitis: Secondary | ICD-10-CM | POA: Diagnosis not present

## 2015-09-28 DIAGNOSIS — F419 Anxiety disorder, unspecified: Secondary | ICD-10-CM | POA: Insufficient documentation

## 2015-09-28 DIAGNOSIS — M19011 Primary osteoarthritis, right shoulder: Secondary | ICD-10-CM | POA: Insufficient documentation

## 2015-09-28 DIAGNOSIS — Z01812 Encounter for preprocedural laboratory examination: Secondary | ICD-10-CM | POA: Insufficient documentation

## 2015-09-28 DIAGNOSIS — Z01818 Encounter for other preprocedural examination: Secondary | ICD-10-CM | POA: Insufficient documentation

## 2015-09-28 DIAGNOSIS — E039 Hypothyroidism, unspecified: Secondary | ICD-10-CM | POA: Insufficient documentation

## 2015-09-28 HISTORY — DX: Personal history of other medical treatment: Z92.89

## 2015-09-28 HISTORY — DX: Chronic obstructive pulmonary disease, unspecified: J44.9

## 2015-09-28 HISTORY — DX: Chronic kidney disease, stage 3 unspecified: N18.30

## 2015-09-28 HISTORY — DX: Chronic kidney disease, stage 3 (moderate): N18.3

## 2015-09-28 LAB — BASIC METABOLIC PANEL
Anion gap: 9 (ref 5–15)
BUN: 16 mg/dL (ref 6–20)
CO2: 27 mmol/L (ref 22–32)
CREATININE: 1.73 mg/dL — AB (ref 0.44–1.00)
Calcium: 9.3 mg/dL (ref 8.9–10.3)
Chloride: 99 mmol/L — ABNORMAL LOW (ref 101–111)
GFR calc non Af Amer: 29 mL/min — ABNORMAL LOW (ref >60)
GFR, EST AFRICAN AMERICAN: 34 mL/min — AB (ref >60)
Glucose, Bld: 91 mg/dL (ref 65–99)
Potassium: 4 mmol/L (ref 3.5–5.1)
Sodium: 135 mmol/L (ref 135–145)

## 2015-09-28 LAB — CBC
HCT: 39.4 % (ref 36.0–46.0)
Hemoglobin: 12.5 g/dL (ref 12.0–15.0)
MCH: 30.2 pg (ref 26.0–34.0)
MCHC: 31.7 g/dL (ref 30.0–36.0)
MCV: 95.2 fL (ref 78.0–100.0)
PLATELETS: 278 10*3/uL (ref 150–400)
RBC: 4.14 MIL/uL (ref 3.87–5.11)
RDW: 13 % (ref 11.5–15.5)
WBC: 6.3 10*3/uL (ref 4.0–10.5)

## 2015-09-28 LAB — SURGICAL PCR SCREEN
MRSA, PCR: POSITIVE — AB
STAPHYLOCOCCUS AUREUS: POSITIVE — AB

## 2015-09-28 NOTE — Pre-Procedure Instructions (Signed)
Jade Walker  09/28/2015      [id number] - [place], [place] - [address] [place] [address] Phone: [phone] Fax: [phone]    Your procedure is scheduled on 10/06/2015.  Report to [facility] Admitting at 5:30 A.M.  Call this number if you have problems the morning of surgery:  [phone]   Remember:  Do not eat food or drink liquids after midnight.  On  Thursday               STOP DIETHYLPROPION TODAY 09/28/2015             Take these medicines the morning of surgery with A SIP OF WATER: Thyroid medicine, use inhaler, , take zyrtec, celexa, omeprazole, & you can use pain medicine if needed    Do not wear jewelry, make-up or nail polish.   Do not wear lotions, powders, or perfumes.      Do not shave 48 hours prior to surgery.    Do not bring valuables to the hospital.   [facility] is not responsible for any belongings or valuables.  Contacts, dentures or bridgework may not be worn into surgery.  Leave your suitcase in the car.  After surgery it may be brought to your room.  For patients admitted to the hospital, discharge time will be determined by your treatment team.  Patients discharged the day of surgery will not be allowed to drive home.   Name and phone number of your driver:   With family  Special instructions:  Special Instructions: [place] - Preparing for Surgery  Before surgery, you can play an important role.  Because skin is not sterile, your skin needs to be as free of germs as possible.  You can reduce the number of germs on you skin by washing with CHG (chlorahexidine gluconate) soap before surgery.  CHG is an antiseptic cleaner which kills germs and bonds with the skin to continue killing germs even after washing.  Please DO NOT use if you have an allergy to CHG or antibacterial soaps.  If your skin becomes reddened/irritated stop using the CHG and inform your  nurse when you arrive at Short Stay.  Do not shave (including legs and underarms) for at least 48 hours prior to the first CHG shower.  You may shave your face.  Please follow these instructions carefully:   1.  Shower with CHG Soap the night before surgery and the  morning of Surgery.  2.  If you choose to wash your hair, wash your hair first as usual with your  normal shampoo.  3.  After you shampoo, rinse your hair and body thoroughly to remove the  Shampoo.  4.  Use CHG as you would any other liquid soap.  You can apply chg directly to the skin and wash gently with scrungie or a clean washcloth.  5.  Apply the CHG Soap to your body ONLY FROM THE NECK DOWN.    Do not use on open wounds or open sores.  Avoid contact with your eyes, ears, mouth and genitals (private parts).  Wash genitals (private parts)   with your normal soap.  6.  Wash thoroughly, paying special attention to the area where your surgery will be performed.  7.  Thoroughly rinse your body with warm water from the neck down.  8.  DO NOT shower/wash with your normal soap after using  and rinsing off   the CHG Soap.  9.  Pat yourself dry with a clean towel.            10.  Wear clean pajamas.            11.  Place clean sheets on your bed the night of your first shower and do not sleep with pets.  Day of Surgery  Do not apply any lotions/deodorants the morning of surgery.  Please wear clean clothes to the hospital/surgery center.  Please read over the following fact sheets that you were given. Pain Booklet, Coughing and Deep Breathing, Blood Transfusion Information, MRSA Information and Surgical Site Infection Prevention

## 2015-09-29 ENCOUNTER — Encounter (HOSPITAL_COMMUNITY): Payer: Self-pay

## 2015-09-29 NOTE — Progress Notes (Signed)
Anesthesia Chart Review:  Pt is 70 year old female scheduled for R total shoulder arthroplasty on 10/06/2015 with Dr. Ranell Patrick.   History includes CKD (stage III), GERD, anxiety, stress incontinence, iron deficiency anemia, hiatal hernia repair , HTN, asthma, hypothyroidism, nephrolithiasis, asthma, hysterectomy, tonsillectomy, cholecystectomy, decompressive laminectomy L4-3 with PLIF 02/12/11 complicated by post-operative hematemesis that evening (EGD showed esophageal ulcer). S/p ORIF radial fracture 03/14/15. S/p L TKA 04/18/15. Never smoker. BMI is 26.  PCP is Daphane Shepherd, PA-C (see Care Everywhere). Office visit from 02/10/15 indicates that PCP was aware that patient was in need for right shoulder surgery.   Meds include albuterol, Zyrtec, Celexa, iron, Advair, levothyroxine, Prilosec, losartan-hctz, Zanaflex, percocet, diethylpropion (sympathomimetic used for weight loss). Pt instructed to stop diethylpropion 09/28/15.   Preoperative labs reviewed. Cr 1.73. This is consistent with prior results found in  records and care everywhere.   03/03/15 EKG: NSR, poor r wave progression. No significant change since 02/12/11 tracing.  10/30/10 Stress echo (HPR) (found in correspondence dated 02/17/2011 in media tab): Poor exercise tolerance. Low normal resting LV systolic function 55%. Hypertensive resting BP with normal response. LV function at peak exercise in not hyperdynamic but there are no focal wall motion abnormalities. Negative exercise treadmill test. Equivocal stress echo with no focal wall motion abnormality post exercise, but without hyperdynamic response.   03/03/15 CXR: Moderate-sized hiatal hernia. Bronchitic changes and hyperinflation consistent with COPD.  Pt tolerated L TKA back in August without issue. If no changes, I anticipate pt can proceed with surgery as scheduled.   Rica Mast, FNP-BC [facility]/Anesthesiology Phone: [phone] 09/29/2015 12:34  PM

## 2015-10-05 ENCOUNTER — Encounter (HOSPITAL_COMMUNITY): Payer: Self-pay | Admitting: Anesthesiology

## 2015-10-05 MED ORDER — CHLORHEXIDINE GLUCONATE 4 % EX LIQD: 60.0000 mL | Freq: Once | CUTANEOUS | Status: DC

## 2015-10-05 MED ORDER — CEFAZOLIN SODIUM-DEXTROSE 2-3 GM-% IV SOLR
2.0000 g | INTRAVENOUS | Status: AC
  Administered 2015-10-06: 2 g via INTRAVENOUS
  Filled 2015-10-05: qty 50

## 2015-10-05 NOTE — Anesthesia Preprocedure Evaluation (Addendum)
Anesthesia Evaluation  Patient identified by MRN, date of birth, ID band Patient awake    Reviewed: Allergy & Precautions, NPO status , Patient's Chart, lab work & pertinent test results  History of Anesthesia Complications (+) history of anesthetic complications  Airway Mallampati: II  TM Distance: >3 FB Neck ROM: Full    Dental  (+) Edentulous Upper, Edentulous Lower   Pulmonary shortness of breath and with exertion, asthma , pneumonia, resolved, COPD,  COPD inhaler,    Pulmonary exam normal breath sounds clear to auscultation- rhonchi       Cardiovascular hypertension, Pt. on medications Normal cardiovascular exam Rhythm:Regular Rate:Normal     Neuro/Psych  Headaches, PSYCHIATRIC DISORDERS Anxiety negative psych ROS   GI/Hepatic hiatal hernia, PUD, GERD  Medicated and Controlled,Hx/o GI bleed post op due to gastric ulcer   Endo/Other  Hypothyroidism   Renal/GU Renal InsufficiencyRenal disease  negative genitourinary   Musculoskeletal  (+) Arthritis , Right knee OA   Abdominal   Peds  Hematology  (+) anemia ,   Anesthesia Other Findings   Reproductive/Obstetrics                          Anesthesia Physical Anesthesia Plan  ASA: III  Anesthesia Plan: General   Post-op Pain Management: GA combined w/ Regional for post-op pain   Induction: Intravenous  Airway Management Planned: Oral ETT  Additional Equipment:   Intra-op Plan:   Post-operative Plan: Extubation in OR  Informed Consent: I have reviewed the patients History and Physical, chart, labs and discussed the procedure including the risks, benefits and alternatives for the proposed anesthesia with the patient or authorized representative who has indicated his/her understanding and acceptance.   Dental advisory given  Plan Discussed with: CRNA, Anesthesiologist and Surgeon  Anesthesia Plan Comments:         Anesthesia Quick Evaluation

## 2015-10-06 ENCOUNTER — Inpatient Hospital Stay (HOSPITAL_COMMUNITY): Payer: Worker's Compensation

## 2015-10-06 ENCOUNTER — Inpatient Hospital Stay (HOSPITAL_COMMUNITY): Payer: Worker's Compensation | Admitting: Emergency Medicine

## 2015-10-06 ENCOUNTER — Inpatient Hospital Stay (HOSPITAL_COMMUNITY)
Admission: RE | Admit: 2015-10-06 | Discharge: 2015-10-07 | DRG: 483 | Disposition: A | Discharge status: Home / Self Care | Payer: Worker's Compensation | Source: Ambulatory Visit | Attending: Orthopedic Surgery | Admitting: Orthopedic Surgery

## 2015-10-06 ENCOUNTER — Encounter (HOSPITAL_COMMUNITY): Payer: Self-pay | Admitting: Anesthesiology

## 2015-10-06 ENCOUNTER — Encounter (HOSPITAL_COMMUNITY)
Admission: RE | Disposition: A | Discharge status: Home / Self Care | Payer: Medicare HMO | Source: Ambulatory Visit | Attending: Orthopedic Surgery

## 2015-10-06 ENCOUNTER — Inpatient Hospital Stay (HOSPITAL_COMMUNITY): Payer: Worker's Compensation | Admitting: Anesthesiology

## 2015-10-06 DIAGNOSIS — K219 Gastro-esophageal reflux disease without esophagitis: Secondary | ICD-10-CM | POA: Diagnosis present

## 2015-10-06 DIAGNOSIS — Z885 Allergy status to narcotic agent status: Secondary | ICD-10-CM | POA: Diagnosis not present

## 2015-10-06 DIAGNOSIS — Z96652 Presence of left artificial knee joint: Secondary | ICD-10-CM | POA: Diagnosis present

## 2015-10-06 DIAGNOSIS — M19011 Primary osteoarthritis, right shoulder: Secondary | ICD-10-CM | POA: Diagnosis present

## 2015-10-06 DIAGNOSIS — M810 Age-related osteoporosis without current pathological fracture: Secondary | ICD-10-CM | POA: Diagnosis present

## 2015-10-06 DIAGNOSIS — Z882 Allergy status to sulfonamides status: Secondary | ICD-10-CM | POA: Diagnosis not present

## 2015-10-06 DIAGNOSIS — J45909 Unspecified asthma, uncomplicated: Secondary | ICD-10-CM | POA: Diagnosis present

## 2015-10-06 DIAGNOSIS — E039 Hypothyroidism, unspecified: Secondary | ICD-10-CM | POA: Diagnosis present

## 2015-10-06 DIAGNOSIS — Z96619 Presence of unspecified artificial shoulder joint: Secondary | ICD-10-CM

## 2015-10-06 DIAGNOSIS — F419 Anxiety disorder, unspecified: Secondary | ICD-10-CM | POA: Diagnosis present

## 2015-10-06 DIAGNOSIS — I10 Essential (primary) hypertension: Secondary | ICD-10-CM | POA: Diagnosis present

## 2015-10-06 DIAGNOSIS — Z96611 Presence of right artificial shoulder joint: Secondary | ICD-10-CM

## 2015-10-06 HISTORY — PX: TOTAL SHOULDER ARTHROPLASTY: SHX126

## 2015-10-06 SURGERY — ARTHROPLASTY, SHOULDER, TOTAL
Anesthesia: General | Site: Shoulder | Laterality: Right

## 2015-10-06 MED ORDER — TIZANIDINE HCL 4 MG PO TABS: 4.0000 mg | ORAL_TABLET | Freq: Four times a day (QID) | ORAL | Status: AC | PRN

## 2015-10-06 MED ORDER — LIDOCAINE HCL (CARDIAC) 20 MG/ML IV SOLN
INTRAVENOUS | Status: DC | PRN
  Administered 2015-10-06: 60 mg via INTRAVENOUS
  Administered 2015-10-06: 20 mg via INTRAVENOUS

## 2015-10-06 MED ORDER — OXYCODONE HCL 5 MG PO TABS: 5.0000 mg | ORAL_TABLET | ORAL | Status: DC | PRN

## 2015-10-06 MED ORDER — LIDOCAINE HCL (CARDIAC) 20 MG/ML IV SOLN
INTRAVENOUS | Status: AC
  Filled 2015-10-06: qty 5

## 2015-10-06 MED ORDER — MIDAZOLAM HCL 5 MG/5ML IJ SOLN
INTRAMUSCULAR | Status: DC | PRN
  Administered 2015-10-06: 2 mg via INTRAVENOUS

## 2015-10-06 MED ORDER — LOSARTAN POTASSIUM 50 MG PO TABS
50.0000 mg | ORAL_TABLET | Freq: Every day | ORAL | Status: DC
  Administered 2015-10-06 – 2015-10-07 (×2): 50 mg via ORAL
  Filled 2015-10-06 (×2): qty 1

## 2015-10-06 MED ORDER — ROCURONIUM BROMIDE 100 MG/10ML IV SOLN
INTRAVENOUS | Status: DC | PRN
  Administered 2015-10-06: 30 mg via INTRAVENOUS

## 2015-10-06 MED ORDER — LEVOTHYROXINE SODIUM 25 MCG PO TABS
25.0000 ug | ORAL_TABLET | Freq: Every day | ORAL | Status: DC
  Administered 2015-10-07: 25 ug via ORAL
  Filled 2015-10-06: qty 1

## 2015-10-06 MED ORDER — CEFAZOLIN SODIUM-DEXTROSE 2-3 GM-% IV SOLR
2.0000 g | Freq: Four times a day (QID) | INTRAVENOUS | Status: AC
  Administered 2015-10-06 – 2015-10-07 (×3): 2 g via INTRAVENOUS
  Filled 2015-10-06 (×5): qty 50

## 2015-10-06 MED ORDER — ONDANSETRON HCL 4 MG/2ML IJ SOLN: 4.0000 mg | Freq: Four times a day (QID) | INTRAMUSCULAR | Status: DC | PRN

## 2015-10-06 MED ORDER — PROPOFOL 10 MG/ML IV BOLUS
INTRAVENOUS | Status: DC | PRN
  Administered 2015-10-06: 130 mg via INTRAVENOUS

## 2015-10-06 MED ORDER — MEPERIDINE HCL 25 MG/ML IJ SOLN: 6.2500 mg | INTRAMUSCULAR | Status: DC | PRN

## 2015-10-06 MED ORDER — TIZANIDINE HCL 4 MG PO TABS: 4.0000 mg | ORAL_TABLET | Freq: Three times a day (TID) | ORAL | Status: DC | PRN

## 2015-10-06 MED ORDER — SALONPAS ARTHRITIS PAIN RELIEF EX PADS: MEDICATED_PAD | Freq: Every day | CUTANEOUS | Status: DC | PRN

## 2015-10-06 MED ORDER — LOSARTAN POTASSIUM-HCTZ 50-12.5 MG PO TABS: 1.0000 | ORAL_TABLET | Freq: Every day | ORAL | Status: DC

## 2015-10-06 MED ORDER — METOCLOPRAMIDE HCL 5 MG/ML IJ SOLN: 5.0000 mg | Freq: Three times a day (TID) | INTRAMUSCULAR | Status: DC | PRN

## 2015-10-06 MED ORDER — OXYCODONE HCL 5 MG PO TABS
ORAL_TABLET | ORAL | Status: AC
  Filled 2015-10-06: qty 2

## 2015-10-06 MED ORDER — GLYCOPYRROLATE 0.2 MG/ML IJ SOLN
INTRAMUSCULAR | Status: DC | PRN
  Administered 2015-10-06: 0.4 mg via INTRAVENOUS

## 2015-10-06 MED ORDER — PHENOL 1.4 % MT LIQD: 1.0000 | OROMUCOSAL | Status: DC | PRN

## 2015-10-06 MED ORDER — METOCLOPRAMIDE HCL 5 MG/ML IJ SOLN: 10.0000 mg | Freq: Once | INTRAMUSCULAR | Status: DC | PRN

## 2015-10-06 MED ORDER — SODIUM CHLORIDE 0.9 % IV SOLN
10.0000 mg | INTRAVENOUS | Status: DC | PRN
  Administered 2015-10-06: 25 ug/min via INTRAVENOUS

## 2015-10-06 MED ORDER — DM-GUAIFENESIN ER 30-600 MG PO TB12
1.0000 | ORAL_TABLET | Freq: Two times a day (BID) | ORAL | Status: DC | PRN
  Filled 2015-10-06: qty 1

## 2015-10-06 MED ORDER — DIETHYLPROPION HCL ER 75 MG PO TB24: 75.0000 mg | ORAL_TABLET | Freq: Every day | ORAL | Status: DC

## 2015-10-06 MED ORDER — METOCLOPRAMIDE HCL 5 MG PO TABS: 5.0000 mg | ORAL_TABLET | Freq: Three times a day (TID) | ORAL | Status: DC | PRN

## 2015-10-06 MED ORDER — BUPIVACAINE-EPINEPHRINE (PF) 0.25% -1:200000 IJ SOLN
INTRAMUSCULAR | Status: AC
  Filled 2015-10-06: qty 30

## 2015-10-06 MED ORDER — ACETAMINOPHEN 325 MG PO TABS: 650.0000 mg | ORAL_TABLET | Freq: Four times a day (QID) | ORAL | Status: DC | PRN

## 2015-10-06 MED ORDER — ALBUTEROL SULFATE (2.5 MG/3ML) 0.083% IN NEBU
2.5000 mg | INHALATION_SOLUTION | Freq: Four times a day (QID) | RESPIRATORY_TRACT | Status: DC | PRN
Start: 2015-10-06 — End: 2015-10-07

## 2015-10-06 MED ORDER — NEOSTIGMINE METHYLSULFATE 10 MG/10ML IV SOLN
INTRAVENOUS | Status: DC | PRN
  Administered 2015-10-06: 3 mg via INTRAVENOUS

## 2015-10-06 MED ORDER — ONDANSETRON HCL 4 MG/2ML IJ SOLN
INTRAMUSCULAR | Status: AC
  Filled 2015-10-06: qty 2

## 2015-10-06 MED ORDER — LACTATED RINGERS IV SOLN
INTRAVENOUS | Status: DC | PRN
  Administered 2015-10-06: 07:00:00 via INTRAVENOUS

## 2015-10-06 MED ORDER — THROMBIN 20000 UNITS EX SOLR
CUTANEOUS | Status: AC
  Filled 2015-10-06: qty 20000

## 2015-10-06 MED ORDER — HYDROCHLOROTHIAZIDE 12.5 MG PO CAPS
12.5000 mg | ORAL_CAPSULE | Freq: Every day | ORAL | Status: DC
  Administered 2015-10-06 – 2015-10-07 (×2): 12.5 mg via ORAL
  Filled 2015-10-06 (×2): qty 1

## 2015-10-06 MED ORDER — DOCUSATE SODIUM 100 MG PO CAPS
100.0000 mg | ORAL_CAPSULE | Freq: Two times a day (BID) | ORAL | Status: DC
  Administered 2015-10-07: 100 mg via ORAL
  Filled 2015-10-06: qty 1

## 2015-10-06 MED ORDER — ONDANSETRON HCL 4 MG PO TABS: 4.0000 mg | ORAL_TABLET | Freq: Four times a day (QID) | ORAL | Status: DC | PRN

## 2015-10-06 MED ORDER — VITAMIN D (ERGOCALCIFEROL) 1.25 MG (50000 UNIT) PO CAPS: 50000.0000 [IU] | ORAL_CAPSULE | ORAL | Status: DC

## 2015-10-06 MED ORDER — BISACODYL 10 MG RE SUPP
10.0000 mg | Freq: Every day | RECTAL | Status: DC | PRN
Start: 2015-10-06 — End: 2015-10-07

## 2015-10-06 MED ORDER — LORATADINE 10 MG PO TABS
10.0000 mg | ORAL_TABLET | Freq: Every day | ORAL | Status: DC
  Administered 2015-10-06 – 2015-10-07 (×2): 10 mg via ORAL
  Filled 2015-10-06 (×2): qty 1

## 2015-10-06 MED ORDER — MIDAZOLAM HCL 2 MG/2ML IJ SOLN
INTRAMUSCULAR | Status: AC
  Filled 2015-10-06: qty 2

## 2015-10-06 MED ORDER — FERROUS SULFATE 325 (65 FE) MG PO TABS
325.0000 mg | ORAL_TABLET | Freq: Every day | ORAL | Status: DC
  Administered 2015-10-07: 325 mg via ORAL
  Filled 2015-10-06: qty 1

## 2015-10-06 MED ORDER — SODIUM CHLORIDE 0.9 % IV SOLN
INTRAVENOUS | Status: DC
  Administered 2015-10-06: 50 mL/h via INTRAVENOUS

## 2015-10-06 MED ORDER — OXYCODONE HCL 5 MG PO TABS
5.0000 mg | ORAL_TABLET | ORAL | Status: DC | PRN
  Administered 2015-10-06 (×2): 10 mg via ORAL
  Administered 2015-10-06: 5 mg via ORAL
  Administered 2015-10-07 (×4): 10 mg via ORAL
  Filled 2015-10-06 (×6): qty 2

## 2015-10-06 MED ORDER — BUPIVACAINE-EPINEPHRINE (PF) 0.5% -1:200000 IJ SOLN
INTRAMUSCULAR | Status: DC | PRN
  Administered 2015-10-06: 25 mL via PERINEURAL

## 2015-10-06 MED ORDER — ONDANSETRON HCL 4 MG/2ML IJ SOLN
INTRAMUSCULAR | Status: DC | PRN
  Administered 2015-10-06: 4 mg via INTRAVENOUS

## 2015-10-06 MED ORDER — FENTANYL CITRATE (PF) 100 MCG/2ML IJ SOLN
INTRAMUSCULAR | Status: DC | PRN
  Administered 2015-10-06: 50 ug via INTRAVENOUS

## 2015-10-06 MED ORDER — MENTHOL 3 MG MT LOZG: 1.0000 | LOZENGE | OROMUCOSAL | Status: DC | PRN

## 2015-10-06 MED ORDER — FENTANYL CITRATE (PF) 250 MCG/5ML IJ SOLN
INTRAMUSCULAR | Status: AC
  Filled 2015-10-06: qty 5

## 2015-10-06 MED ORDER — FENTANYL CITRATE (PF) 100 MCG/2ML IJ SOLN
25.0000 ug | INTRAMUSCULAR | Status: DC | PRN
  Administered 2015-10-06 (×4): 25 ug via INTRAVENOUS

## 2015-10-06 MED ORDER — HEMOSTATIC AGENTS (NO CHARGE) OPTIME
TOPICAL | Status: DC | PRN
  Administered 2015-10-06: 1 via TOPICAL

## 2015-10-06 MED ORDER — MOMETASONE FURO-FORMOTEROL FUM 100-5 MCG/ACT IN AERO
2.0000 | INHALATION_SPRAY | Freq: Two times a day (BID) | RESPIRATORY_TRACT | Status: DC
  Administered 2015-10-06 – 2015-10-07 (×2): 2 via RESPIRATORY_TRACT
  Filled 2015-10-06: qty 8.8

## 2015-10-06 MED ORDER — THROMBIN 20000 UNITS EX KIT
PACK | CUTANEOUS | Status: DC | PRN
  Administered 2015-10-06: 20000 [IU] via TOPICAL

## 2015-10-06 MED ORDER — PROPOFOL 10 MG/ML IV BOLUS
INTRAVENOUS | Status: AC
  Filled 2015-10-06: qty 20

## 2015-10-06 MED ORDER — 0.9 % SODIUM CHLORIDE (POUR BTL) OPTIME
TOPICAL | Status: DC | PRN
  Administered 2015-10-06: 1000 mL

## 2015-10-06 MED ORDER — ACETAMINOPHEN 650 MG RE SUPP: 650.0000 mg | Freq: Four times a day (QID) | RECTAL | Status: DC | PRN

## 2015-10-06 MED ORDER — HYDROMORPHONE HCL 1 MG/ML IJ SOLN: 0.5000 mg | INTRAMUSCULAR | Status: DC | PRN

## 2015-10-06 MED ORDER — PANTOPRAZOLE SODIUM 40 MG PO TBEC
80.0000 mg | DELAYED_RELEASE_TABLET | Freq: Every day | ORAL | Status: DC
  Administered 2015-10-07: 80 mg via ORAL
  Filled 2015-10-06: qty 2

## 2015-10-06 MED ORDER — CITALOPRAM HYDROBROMIDE 10 MG PO TABS
10.0000 mg | ORAL_TABLET | Freq: Every day | ORAL | Status: DC
  Administered 2015-10-07: 10 mg via ORAL
  Filled 2015-10-06: qty 1

## 2015-10-06 MED ORDER — FENTANYL CITRATE (PF) 100 MCG/2ML IJ SOLN
INTRAMUSCULAR | Status: AC
  Filled 2015-10-06: qty 2

## 2015-10-06 MED ORDER — POLYETHYLENE GLYCOL 3350 17 G PO PACK
17.0000 g | PACK | Freq: Every day | ORAL | Status: DC | PRN
  Filled 2015-10-06: qty 1

## 2015-10-06 SURGICAL SUPPLY — 74 items
BLADE SAW SAG 73X25 THK (BLADE) ×2
BLADE SAW SGTL 73X25 THK (BLADE) ×1 IMPLANT
BUR SURG 4X8 MED (BURR) IMPLANT
BURR SURG 4MMX8MM MEDIUM (BURR)
BURR SURG 4X8 MED (BURR)
CAPT SHLDR TOTAL 2 ×3 IMPLANT
CEMENT BONE DEPUY (Cement) ×3 IMPLANT
CLOSURE WOUND 1/2 X4 (GAUZE/BANDAGES/DRESSINGS) ×1
COVER SURGICAL LIGHT HANDLE (MISCELLANEOUS) ×3 IMPLANT
DRAPE IMP U-DRAPE 54X76 (DRAPES) ×3 IMPLANT
DRAPE INCISE IOBAN 66X45 STRL (DRAPES) ×9 IMPLANT
DRAPE U-SHAPE 47X51 STRL (DRAPES) ×3 IMPLANT
DRAPE X-RAY CASS 24X20 (DRAPES) IMPLANT
DRILL BIT 5/64 (BIT) ×3 IMPLANT
DRSG ADAPTIC 3X8 NADH LF (GAUZE/BANDAGES/DRESSINGS) ×3 IMPLANT
DRSG PAD ABDOMINAL 8X10 ST (GAUZE/BANDAGES/DRESSINGS) ×6 IMPLANT
DURAPREP 26ML APPLICATOR (WOUND CARE) ×3 IMPLANT
ELECT BLADE 4.0 EZ CLEAN MEGAD (MISCELLANEOUS) ×3
ELECT NEEDLE TIP 2.8 STRL (NEEDLE) ×3 IMPLANT
ELECT REM PT RETURN 9FT ADLT (ELECTROSURGICAL) ×3
ELECTRODE BLDE 4.0 EZ CLN MEGD (MISCELLANEOUS) ×1 IMPLANT
ELECTRODE REM PT RTRN 9FT ADLT (ELECTROSURGICAL) ×1 IMPLANT
GAUZE SPONGE 4X4 12PLY STRL (GAUZE/BANDAGES/DRESSINGS) ×3 IMPLANT
GAUZE SPONGE 4X4 16PLY XRAY LF (GAUZE/BANDAGES/DRESSINGS) ×3 IMPLANT
GLOVE BIOGEL PI IND STRL 6.5 (GLOVE) ×1 IMPLANT
GLOVE BIOGEL PI INDICATOR 6.5 (GLOVE) ×2
GLOVE BIOGEL PI ORTHO PRO 7.5 (GLOVE) ×2
GLOVE BIOGEL PI ORTHO PRO SZ8 (GLOVE) ×2
GLOVE ORTHO TXT STRL SZ7.5 (GLOVE) ×3 IMPLANT
GLOVE PI ORTHO PRO STRL 7.5 (GLOVE) ×1 IMPLANT
GLOVE PI ORTHO PRO STRL SZ8 (GLOVE) ×1 IMPLANT
GLOVE SURG ORTHO 8.5 STRL (GLOVE) ×6 IMPLANT
GLOVE SURG SS PI 6.5 STRL IVOR (GLOVE) ×3 IMPLANT
GOWN STRL REUS W/ TWL XL LVL3 (GOWN DISPOSABLE) ×3 IMPLANT
GOWN STRL REUS W/TWL XL LVL3 (GOWN DISPOSABLE) ×6
HANDPIECE INTERPULSE COAX TIP (DISPOSABLE)
KIT BASIN OR (CUSTOM PROCEDURE TRAY) ×3 IMPLANT
KIT ROOM TURNOVER OR (KITS) ×3 IMPLANT
MANIFOLD NEPTUNE II (INSTRUMENTS) ×3 IMPLANT
NDL SUT 6 .5 CRC .975X.05 MAYO (NEEDLE) ×1 IMPLANT
NEEDLE 1/2 CIR MAYO (NEEDLE) ×3 IMPLANT
NEEDLE HYPO 25GX1X1/2 BEV (NEEDLE) ×3 IMPLANT
NEEDLE MAYO TAPER (NEEDLE) ×2
NS IRRIG 1000ML POUR BTL (IV SOLUTION) ×3 IMPLANT
PACK SHOULDER (CUSTOM PROCEDURE TRAY) ×3 IMPLANT
PACK UNIVERSAL I (CUSTOM PROCEDURE TRAY) ×3 IMPLANT
PAD ARMBOARD 7.5X6 YLW CONV (MISCELLANEOUS) ×6 IMPLANT
PIN METAGLENE 2.5 (PIN) ×3 IMPLANT
SET HNDPC FAN SPRY TIP SCT (DISPOSABLE) IMPLANT
SLING ARM IMMOBILIZER LRG (SOFTGOODS) ×3 IMPLANT
SLING ARM IMMOBILIZER MED (SOFTGOODS) ×3 IMPLANT
SMARTMIX MINI TOWER (MISCELLANEOUS) ×3
SPONGE LAP 18X18 X RAY DECT (DISPOSABLE) ×3 IMPLANT
SPONGE LAP 4X18 X RAY DECT (DISPOSABLE) ×3 IMPLANT
SPONGE SURGIFOAM ABS GEL SZ50 (HEMOSTASIS) IMPLANT
STRIP CLOSURE SKIN 1/2X4 (GAUZE/BANDAGES/DRESSINGS) ×2 IMPLANT
SUCTION FRAZIER HANDLE 10FR (MISCELLANEOUS) ×2
SUCTION TUBE FRAZIER 10FR DISP (MISCELLANEOUS) ×1 IMPLANT
SUT FIBERWIRE #2 38 T-5 BLUE (SUTURE) ×15
SUT MNCRL AB 4-0 PS2 18 (SUTURE) ×3 IMPLANT
SUT VIC AB 0 CT1 27 (SUTURE) ×2
SUT VIC AB 0 CT1 27XBRD ANBCTR (SUTURE) ×1 IMPLANT
SUT VIC AB 2-0 CT1 27 (SUTURE) ×2
SUT VIC AB 2-0 CT1 TAPERPNT 27 (SUTURE) ×1 IMPLANT
SUT VICRYL AB 2 0 TIES (SUTURE) ×3 IMPLANT
SUTURE FIBERWR #2 38 T-5 BLUE (SUTURE) ×5 IMPLANT
SYR CONTROL 10ML LL (SYRINGE) ×3 IMPLANT
TAPE CLOTH SURG 6X10 WHT LF (GAUZE/BANDAGES/DRESSINGS) ×3 IMPLANT
TOWEL OR 17X24 6PK STRL BLUE (TOWEL DISPOSABLE) ×3 IMPLANT
TOWEL OR 17X26 10 PK STRL BLUE (TOWEL DISPOSABLE) ×3 IMPLANT
TOWER SMARTMIX MINI (MISCELLANEOUS) ×1 IMPLANT
TRAY FOLEY CATH 16FRSI W/METER (SET/KITS/TRAYS/PACK) ×3 IMPLANT
WATER STERILE IRR 1000ML POUR (IV SOLUTION) ×3 IMPLANT
YANKAUER SUCT BULB TIP NO VENT (SUCTIONS) IMPLANT

## 2015-10-06 NOTE — Brief Op Note (Signed)
10/06/2015  10:25 AM  PATIENT:  Jade Walker  70 y.o. female  PRE-OPERATIVE DIAGNOSIS:  RIGHT SHOULDER OA END STAGE   POST-OPERATIVE DIAGNOSIS:  RIGHT SHOULDER OA END STAGE  PROCEDURE:  Procedure(s): RIGHT TOTAL SHOULDER ARTHROPLASTY (Right) DePuy [employer] Unite  SURGEON:  Surgeon(s) and Role:    * Beverely Low, MD - Primary  PHYSICIAN ASSISTANT:   ASSISTANTS: Thea Gist, PA-C   ANESTHESIA:   regional and general  EBL:  Total I/O In: 600 [I.V.:600] Out: 200 [Blood:200]  BLOOD ADMINISTERED:none  DRAINS: none   LOCAL MEDICATIONS USED:  MARCAINE     SPECIMEN:  No Specimen  DISPOSITION OF SPECIMEN:  N/A  COUNTS:  YES  TOURNIQUET:  * No tourniquets in log *  DICTATION: .Other Dictation: Dictation Number [id number]  PLAN OF CARE: Admit to inpatient   PATIENT DISPOSITION:  PACU - hemodynamically stable.   Delay start of Pharmacological VTE agent (>24hrs) due to surgical blood loss or risk of bleeding: yes

## 2015-10-06 NOTE — Anesthesia Procedure Notes (Addendum)
Anesthesia Regional Block:  Interscalene brachial plexus block  Pre-Anesthetic Checklist: ,, timeout performed, Correct Patient, Correct Site, Correct Laterality, Correct Procedure, Correct Position, site marked, Risks and benefits discussed,  Surgical consent,  Pre-op evaluation,  At surgeon's request and post-op pain management  Laterality: Right  Prep: chloraprep       Needles:  Injection technique: Single-shot  Needle Type: Echogenic Stimulator Needle     Needle Length: 4cm 4 cm Needle Gauge: 21 and 21 G  Needle insertion depth: 3.5 cm   Additional Needles:  Procedures: ultrasound guided (picture in chart) Interscalene brachial plexus block Narrative:  Injection made incrementally with aspirations every 5 mL.  Performed by: Personally  Anesthesiologist: Josephine Igo  Additional Notes: Monitors applied. Patient sedated. Relevant anatomy identified on [place]. Good visualization of LA spread. Patient tolerated procedure well. Picture in chart.   Procedure Name: Intubation Date/Time: 10/06/2015 7:42 AM Performed by: Kyung Rudd Pre-anesthesia Checklist: Patient identified, Emergency Drugs available, Patient being monitored, Suction available and Timeout performed Patient Re-evaluated:Patient Re-evaluated prior to inductionOxygen Delivery Method: Circle system utilized Preoxygenation: Pre-oxygenation with 100% oxygen Intubation Type: IV induction Ventilation: Mask ventilation without difficulty Laryngoscope Size: Mac and 3 Grade View: Grade I Tube type: Oral Tube size: 7.0 mm Number of attempts: 1 Airway Equipment and Method: Stylet and LTA kit utilized Placement Confirmation: ETT inserted through vocal cords under direct vision,  positive ETCO2 and breath sounds checked- equal and bilateral Secured at: 20 cm Tube secured with: Tape Dental Injury: Teeth and Oropharynx as per pre-operative assessment

## 2015-10-06 NOTE — Anesthesia Postprocedure Evaluation (Signed)
Anesthesia Post Note  Patient: Jade Walker  Procedure(s) Performed: Procedure(s) (LRB): RIGHT TOTAL SHOULDER ARTHROPLASTY (Right)  Patient location during evaluation: PACU Anesthesia Type: General Level of consciousness: awake and alert and oriented Pain management: pain level controlled Vital Signs Assessment: post-procedure vital signs reviewed and stable Respiratory status: spontaneous breathing, nonlabored ventilation, respiratory function stable and patient connected to nasal cannula oxygen Cardiovascular status: blood pressure returned to baseline and stable Postop Assessment: no signs of nausea or vomiting Anesthetic complications: no    Last Vitals:  Filed Vitals:   10/06/15 0616 10/06/15 1021  BP:    Pulse:    Temp: 36.4 C 36.6 C  Resp:      Last Pain:  Filed Vitals:   10/06/15 1027  PainSc: Asleep                 Niah Heinle A.

## 2015-10-06 NOTE — Op Note (Signed)
Jade Walker, Jade Walker             ACCOUNT NO.:  [id number]  MEDICAL RECORD NO.:  [id number]  LOCATION:  [facility]                         FACILITY:  [facility]  PHYSICIAN:  Almedia Balls. Ranell Patrick, M.D. DATE OF BIRTH:  February 07, 1946  DATE OF PROCEDURE:  10/06/2015 DATE OF DISCHARGE:                              OPERATIVE REPORT   PREOPERATIVE DIAGNOSIS:  Right shoulder end-stage arthritis.  POSTOPERATIVE DIAGNOSIS:  Right shoulder end-stage arthritis.  PROCEDURE PERFORMED:  Right total shoulder arthroplasty using DePuy [employer] Unite System.  ATTENDING SURGEON:  Almedia Balls. Ranell Patrick, MD.  ASSISTANT:  Donnie Coffin. Dixon, PA-C, who scrubbed the entire procedure and necessary for satisfactory completion of surgery.  ANESTHESIA:  General anesthesia was used plus interscalene block.  ESTIMATED BLOOD LOSS:  200 mL.  FLUID REPLACED:  1500 mL crystalloid.  INSTRUMENT COUNT:  Correct.  COMPLICATIONS:  There were no complications.  ANTIBIOTICS:  Perioperative antibiotics were given.  INDICATIONS:  The patient is a 70 year old female with worsening arthritis in the right shoulder.  The patient had a work injury which is an acute on chronic situation.  She has not had any symptoms prior to the lifting heavy object at work, when she complained of immediate pain in the shoulder and an inability to lift her arm.  She presented with significant arthritis in the joint and an intact rotator cuff and due to failure of conservative management including injections, modification activity, anti-inflammatory, and  pain medication.  She presents now for total shoulder arthroplasty to restore function and eliminate pain. Risks and benefits of surgery discussed, informed consent obtained.  DESCRIPTION OF PROCEDURE:  After an adequate level of anesthesia achieved, the patient was positioned in modified beach-chair position. Right shoulder correctly identified, sterilely prepped and draped in usual manner.  Time-out called.   We entered the shoulder in standard deltopectoral incision, started at the coracoid process extending down to the anterior humerus.  Dissection down through subcutaneous tissues using Bovie, identified cephalic vein, took it laterally with the deltoid, pectoralis taken medially.  The upper conjoined tendon was identified and retracted.  We then went ahead and released the subscapularis subperiosteally off the anterior humerus.  We tagged that for repair again with #2 FiberWire suture in a modified W stitch.  Next, we released the anterior capsule off the humerus as we progressively externally rotated, down the inferior portion of the humeral neck. Next, we went and placed our T-handled Crego elevator over the top of the humeral head and underneath the rotator cuff to protect that and then the large Crego elevator on the medial humeral neck.  We then placed the elbow at the patient's side and a 20-25 degrees of external rotation of the forearm to allow [place] to make an appropriately angled 20- degree retroverted cut.  Once we had the elbow in position, we went ahead and used an oscillating saw to make our head cut, removed excess osteophytes off the inferior humerus and around the back.  We then reamed up to a size 12 canal size and then broached up to a size 10 proximal body size, so we then selected a trial 12 stem and 10 body and impacted that in position, felt like we  had excellent matching of her anatomy in terms of the canal size as well as the proximal metaphyseal size.  We then removed the trial components, subluxed the humerus posteriorly, did a 360-degree capsular removal.  We removed osteophytes off the posterior and anterior aspects of the glenoid, which developed that had significantly changed the morphology anatomy of the glenoid. Once we were back to native glenoid, we sized it to a size 44 glenoid. We drilled our center guide pin.  We then did our reaming with a low- profile  reamer and then did our peripheral reamer by hand.  We then drilled our central peg hole for the anchor peg glenoid for the DePuy system and then placed our drill guide and then drilled our superior hole and placed a peg to hold that position prevent rotation and drilled our 2 anterior holes.  It should be noted that there was extensive cystic formation within the bone of the glenoid.  Some of the cyst communicated and connected to these PEG holes, and definitely compromised in my opinion, some of the potential fixation as well.  The anterior-inferior peg hole while surrounded by good bone had perforated the cortex inferiorly and may not hold cement for appropriate grounding. We did feel, however, this would with the central PEG the posterior- inferior and the superior that we would have enough initial fixation to hold this implant while the PEG is ingrown.  We went ahead and used epi soaked Gelfoam into each of the peg holes.  We then vacuum mixed DePuy 1 cement and then placed the cement in the peripheral holes and a little bit around the central peg hole where there was some bone missing due to a cyst.  We impacted the APG into position and had good support and felt stable to me.  We held it while the cement hardened.  Once the cement hardened, removed excess cement with just a Freer along the top of the glenoid and then checked the glenoid.  I was able to take a Glorious Peach and tried to __________ and everything moved together as 1 unit.  They did not detect any relative motion between the implant and the bone.  We then went ahead and irrigated, checked the glenohumeral joint for any other loose debris.  None was noted and we then placed drill holes in the lesser tuberosity and placed #2 FiberWire suture x2 through those drill holes.  We then used impaction grafting technique to impact the real press-fit stem in place with a 12 stem 10 proximal body impacted in the 25 degrees retroversion.   Once that was impacted in place, we trialed with a 44 x 18 eccentric head provided nice bony coverage.  We then removed the trial head and impacted our real head in place, reduced the shoulder and happy with soft tissue balancing and excursion, then repaired subscapularis anatomically back to the lesser tuberosity with suture to bone as well as rotator interval closure. At this point, we ranged the shoulder.  It had excellent range of motion, nice stability, irrigated the shoulder thoroughly and then closed the deltopectoral interval with 0 Vicryl suture, followed by 2-0 Vicryl subcutaneous closure, and 4-0 Monocryl for skin.  Steri-Strips applied followed by sterile dressing.  The patient tolerated the surgery well.     Almedia Balls. Ranell Patrick, M.D.     SRN/MEDQ  D:  10/06/2015  T:  10/06/2015  Job:  [id number]

## 2015-10-06 NOTE — Care Management Note (Addendum)
Case Management Note  Patient Details  Name: Jade Walker MRN: [id number] Date of Birth: 04-Mar-1946  Subjective/Objective:   70 yr old female s/p right total shoulder arthroplasty.                 Action/Plan: Case manager spoke with patient and daughter at bedside. She is under worker's comp. Her case is handled by an attorney Janett Labella [phone]. WC# : [id number]  Patient's worker's Comp adjuster is: Viviann Spare  [phone], Fax: [phone]  Expected Discharge Date:                  Expected Discharge Plan:   Home  In-House Referral:     Discharge planning Services     Post Acute Care Choice:    Choice offered to:     DME Arranged:   NA DME Agency:     HH Arranged:    HH Agency:     Status of Service:   In process  Medicare Important Message Given:    Date Medicare IM Given:    Medicare IM give by:    Date Additional Medicare IM Given:    Additional Medicare Important Message give by:     If discussed at Long Length of Stay Meetings, dates discussed:    Additional Comments:  Durenda Guthrie, RN 10/06/2015, 3:36 PM

## 2015-10-06 NOTE — Transfer of Care (Signed)
Immediate Anesthesia Transfer of Care Note  Patient: Jade Walker  Procedure(s) Performed: Procedure(s): RIGHT TOTAL SHOULDER ARTHROPLASTY (Right)  Patient Location: PACU  Anesthesia Type:GA combined with regional for post-op pain  Level of Consciousness: awake, alert  and oriented  Airway & Oxygen Therapy: Patient Spontanous Breathing and Patient connected to face mask oxygen  Post-op Assessment: Report given to RN, Post -op Vital signs reviewed and stable and Patient moving all extremities X 4  Post vital signs: Reviewed and stable  Last Vitals:  Filed Vitals:   10/06/15 0607 10/06/15 0616  BP: 116/64   Pulse: 57   Temp:  36.4 C  Resp: 20     Complications: No apparent anesthesia complications

## 2015-10-06 NOTE — Interval H&P Note (Signed)
History and Physical Interval Note:  10/06/2015 7:25 AM  Jade Walker  has presented today for surgery, with the diagnosis of RIGHT SHOULDER OA END STAGE   The various methods of treatment have been discussed with the patient and family. After consideration of risks, benefits and other options for treatment, the patient has consented to  Procedure(s): RIGHT TOTAL SHOULDER ARTHROPLASTY (Right) as a surgical intervention .  The patient's history has been reviewed, patient examined, no change in status, stable for surgery.  I have reviewed the patient's chart and labs.  Questions were answered to the patient's satisfaction.     Jade Walker,STEVEN R

## 2015-10-06 NOTE — Discharge Instructions (Signed)
Ice to the shoulder as much as possible.  May use the right shoulder for light ADLs.  Do not push up out of the chair with the right arm.  May remove the sling as you would like.  Recommend using while up and walking around.  Keep the incision clean and dry and covered for one week, then ok to get wet in the shower.  Follow up in two weeks in the office with Dr Ranell Patrick [phone]

## 2015-10-07 LAB — BASIC METABOLIC PANEL
Anion gap: 7 (ref 5–15)
BUN: 20 mg/dL (ref 6–20)
CALCIUM: 8.8 mg/dL — AB (ref 8.9–10.3)
CO2: 27 mmol/L (ref 22–32)
CREATININE: 1.68 mg/dL — AB (ref 0.44–1.00)
Chloride: 98 mmol/L — ABNORMAL LOW (ref 101–111)
GFR calc Af Amer: 35 mL/min — ABNORMAL LOW (ref >60)
GFR calc non Af Amer: 30 mL/min — ABNORMAL LOW (ref >60)
GLUCOSE: 104 mg/dL — AB (ref 65–99)
Potassium: 4 mmol/L (ref 3.5–5.1)
Sodium: 132 mmol/L — ABNORMAL LOW (ref 135–145)

## 2015-10-07 LAB — HEMOGLOBIN AND HEMATOCRIT, BLOOD
HCT: 35.4 % — ABNORMAL LOW (ref 36.0–46.0)
HEMOGLOBIN: 11 g/dL — AB (ref 12.0–15.0)

## 2015-10-07 NOTE — Discharge Summary (Signed)
Physician Discharge Summary   Patient ID: Jade Walker MRN: [id number] DOB/AGE: 02-11-1946 70 y.o.  Admit date: 10/06/2015 Discharge date: 10/07/2015  Admission Diagnoses:  Active Problems:   S/P shoulder replacement   Discharge Diagnoses:  Same   Surgeries: Procedure(s): RIGHT TOTAL SHOULDER ARTHROPLASTY on 10/06/2015   Consultants: OT  Discharged Condition: Stable  Hospital Course: Jade Walker is an 70 y.o. female who was admitted 10/06/2015 with a chief complaint of right shoulder pain, and found to have a diagnosis of right shoulder arthritis.  They were brought to the operating room on 10/06/2015 and underwent the above named procedures.    The patient had an uncomplicated hospital course and was stable for discharge.  Recent vital signs:  Filed Vitals:   10/06/15 2025 10/07/15 0458  BP: 113/67 115/61  Pulse: 76 78  Temp: 98.1 F (36.7 C) 98.6 F (37 C)  Resp: 18 18    Recent laboratory studies:  Results for orders placed or performed during the hospital encounter of 10/06/15  Hemoglobin and hematocrit, blood  Result Value Ref Range   Hemoglobin 11.0 (L) 12.0 - 15.0 g/dL   HCT 40.9 (L) 81.1 - 91.4 %  Basic metabolic panel  Result Value Ref Range   Sodium 132 (L) 135 - 145 mmol/L   Potassium 4.0 3.5 - 5.1 mmol/L   Chloride 98 (L) 101 - 111 mmol/L   CO2 27 22 - 32 mmol/L   Glucose, Bld 104 (H) 65 - 99 mg/dL   BUN 20 6 - 20 mg/dL   Creatinine, Ser 7.82 (H) 0.44 - 1.00 mg/dL   Calcium 8.8 (L) 8.9 - 10.3 mg/dL   GFR calc non Af Amer 30 (L) >60 mL/min   GFR calc Af Amer 35 (L) >60 mL/min   Anion gap 7 5 - 15    Discharge Medications:     Medication List    TAKE these medications        albuterol (2.5 MG/3ML) 0.083% nebulizer solution  Commonly known as:  PROVENTIL  Take 2.5 mg by nebulization every 6 (six) hours as needed for wheezing or shortness of breath. i puff once a day as needed     cetirizine 10 MG tablet  Commonly known as:   ZYRTEC  Take 10 mg by mouth daily.     citalopram 10 MG tablet  Commonly known as:  CELEXA  Take 10 mg by mouth daily.     dextromethorphan-guaiFENesin 30-600 MG 12hr tablet  Commonly known as:  MUCINEX DM  Take 1 tablet by mouth 2 (two) times daily as needed for cough (or congestion).     Diethylpropion HCl CR 75 MG Tb24  Take 75 mg by mouth daily.     ferrous sulfate 325 (65 FE) MG EC tablet  Take 325 mg by mouth daily with breakfast.     Fluticasone-Salmeterol 250-50 MCG/DOSE Aepb  Commonly known as:  ADVAIR  Inhale 1 puff into the lungs 2 (two) times daily.     levothyroxine 25 MCG tablet  Commonly known as:  SYNTHROID, LEVOTHROID  Take 25 mcg by mouth daily before breakfast.     losartan-hydrochlorothiazide 50-12.5 MG tablet  Commonly known as:  HYZAAR  Take 1 tablet by mouth daily.     omeprazole 40 MG capsule  Commonly known as:  PRILOSEC  Take 40 mg by mouth 2 (two) times daily.     oxyCODONE 5 MG immediate release tablet  Commonly known as:  Oxy IR/ROXICODONE  Take  1-2 tablets (5-10 mg total) by mouth every 3 (three) hours as needed for breakthrough pain.     oxyCODONE 5 MG immediate release tablet  Commonly known as:  ROXICODONE  Take 1-2 tablets (5-10 mg total) by mouth every 4 (four) hours as needed for severe pain.     SALONPAS ARTHRITIS PAIN RELIEF EX  Apply 1 patch topically daily as needed (Back and sholders).     tiZANidine 4 MG tablet  Commonly known as:  ZANAFLEX  Take 4 mg by mouth every 8 (eight) hours as needed for muscle spasms.     tiZANidine 4 MG tablet  Commonly known as:  ZANAFLEX  Take 1 tablet (4 mg total) by mouth every 6 (six) hours as needed for muscle spasms.     Vitamin D (Ergocalciferol) 50000 units Caps capsule  Commonly known as:  DRISDOL  Take 50,000 Units by mouth every 7 (seven) days. On Monday.        Diagnostic Studies: Dg Shoulder Right Port  2015-10-14  CLINICAL DATA:  Z96.611 (ICD-10-CM) - S/P shoulder  replacement, right Post op PACU EXAM: PORTABLE RIGHT SHOULDER - 2+ VIEW COMPARISON:  03/03/2015 chest x-ray FINDINGS: Patient has undergone right shoulder arthroplasty. The humeral head component appears well seated over the glenoid on the frontal view provided. No evidence for new fracture or dislocation. IMPRESSION: Status post right shoulder arthroplasty. No adverse features identified. Electronically Signed   By: Norva Pavlov M.D.   On: 10-14-2015 11:38    Disposition: 06-Home-Health Care Svc        Follow-up Information    Follow up with Julio Zappia,STEVEN R, MD. Call in 2 weeks.   Specialty:  Orthopedic Surgery   Why:  [phone]   Contact information:   [address] [place] [address] [phone]        Signed: Verlee Rossetti 10/07/2015, 9:17 AM

## 2015-10-07 NOTE — Progress Notes (Signed)
Occupational Therapy Treatment Patient Details Name: Jade Walker MRN: [id number] DOB: 09-07-1946 Today's Date: 10/07/2015    History of present illness Pt is a 70 y.o. female s/p RIGHT TOTAL SHOULDER ARTHROPLASTY on 10/06/2015. PMH: HTN, asthma, Shortness of breath dyspnea.    OT comments  Pt making good progress toward OT goals. Pt tolerated R UE exercises; husband verbalized understanding of how he can assist with exercises as needed. Pt able to teach back all shoulder and ADL education. Pt and family with no further questions or concerns for OT at this time. Pt is ready to d/c from OT standpoint but will continue to follow acutely.    Follow Up Recommendations  Supervision/Assistance - 24 hour;Other (comment) (Follow up per MD)    Equipment Recommendations  None recommended by OT    Recommendations for Other Services      Precautions / Restrictions Precautions Precautions: Shoulder;Fall Type of Shoulder Precautions: Active protocol: AROM/PROM FF 90, ABD 60, ER 30. AROM elbow, wrist, hand OK. Shoulder Interventions: Shoulder sling/immobilizer;For comfort (and sleep) Precaution Booklet Issued: Yes (comment) Precaution Comments: Reviewed precautions with pt and family. Required Braces or Orthoses: Sling Restrictions Weight Bearing Restrictions: Yes RUE Weight Bearing: Non weight bearing       Mobility Bed Mobility Overal bed mobility: Modified Independent Bed Mobility: Supine to Sit       Transfers          General transfer comment: Not assessed at this time.    Balance Overall balance assessment: No apparent balance deficits (not formally assessed)                                 ADL Overall ADL's : Needs assistance/impaired Eating/Feeding: Set up;Sitting   Grooming: Supervision/safety;Standing   Upper Body Bathing: Minimal assitance;Sitting Upper Body Bathing Details (indicate cue type and reason): Educated on UB bathing technique.      Upper Body Dressing : Minimal assistance;Sitting Upper Body Dressing Details (indicate cue type and reason): Educated on UB dressing technique. Lower Body Dressing: Supervision/safety;Sit to/from stand Lower Body Dressing Details (indicate cue type and reason): Pt able to don socks in sitting. Supervision for sit to stand. Toilet Transfer: Supervision/safety;Ambulation;Regular Teacher, adult education Details (indicate cue type and reason): Simulated by transfer from EOB to chair. Toileting- Clothing Manipulation and Hygiene: Supervision/safety;Sit to/from stand       Functional mobility during ADLs: Supervision/safety General ADL Comments: Pt requested for daughter to assist with dressing, therefore not attempted by OT. Pt able to verbalize correct UB bathing and dressing technique, sling management and wear schedule, proper positioning of RUE. Educated on R UE exercises; pt able to return demo understanding. Husband verbalized understanding of how he can assist with exercises as needed.      Vision                     Perception     Praxis      Cognition   Behavior During Therapy: WFL for tasks assessed/performed Overall Cognitive Status: Within Functional Limits for tasks assessed                       Extremity/Trunk Assessment  Upper Extremity Assessment Upper Extremity Assessment: RUE deficits/detail RUE Deficits / Details: Finger, wrist AROM WFL. Overall strength WFL for s/p TSA. RUE: Unable to fully assess due to immobilization;Unable to fully assess due to pain   Lower  Extremity Assessment Lower Extremity Assessment: Overall WFL for tasks assessed   Cervical / Trunk Assessment Cervical / Trunk Assessment: Normal    Exercises Shoulder Exercises Shoulder Flexion: AAROM;Self ROM;Right;10 reps;Supine (0-45 degrees ) Shoulder ABduction: AAROM;Self ROM;Right;10 reps;Supine (0-45 degrees ) Shoulder External Rotation: AAROM;Self ROM;Right;10 reps;Supine  (able to acheive close to neutral position) Elbow Flexion: AROM;Right;10 reps;Supine (close to full ROM, slighlty limited by edema) Wrist Flexion: AROM;Right;10 reps;Supine (full ROM) Digit Composite Flexion: AROM;Right;10 reps;Supine (full ROM) Donning/doffing shirt without moving shoulder: Minimal assistance;Caregiver independent with task;Patient able to independently direct caregiver Method for sponge bathing under operated UE: Minimal assistance;Caregiver independent with task;Patient able to independently direct caregiver Donning/doffing sling/immobilizer: Moderate assistance;Caregiver independent with task;Patient able to independently direct caregiver Correct positioning of sling/immobilizer: Minimal assistance;Caregiver independent with task;Patient able to independently direct caregiver ROM for elbow, wrist and digits of operated UE: Supervision/safety;Caregiver independent with task;Patient able to independently direct caregiver Sling wearing schedule (on at all times/off for ADL's): Supervision/safety;Caregiver independent with task;Patient able to independently direct caregiver Proper positioning of operated UE when showering: Supervision/safety;Caregiver independent with task;Patient able to independently direct caregiver Positioning of UE while sleeping: Minimal assistance;Caregiver independent with task;Patient able to independently direct caregiver   Shoulder Instructions Shoulder Instructions Donning/doffing shirt without moving shoulder: Minimal assistance;Caregiver independent with task;Patient able to independently direct caregiver Method for sponge bathing under operated UE: Minimal assistance;Caregiver independent with task;Patient able to independently direct caregiver Donning/doffing sling/immobilizer: Moderate assistance;Caregiver independent with task;Patient able to independently direct caregiver Correct positioning of sling/immobilizer: Minimal assistance;Caregiver  independent with task;Patient able to independently direct caregiver ROM for elbow, wrist and digits of operated UE: Supervision/safety;Caregiver independent with task;Patient able to independently direct caregiver Sling wearing schedule (on at all times/off for ADL's): Supervision/safety;Caregiver independent with task;Patient able to independently direct caregiver Proper positioning of operated UE when showering: Supervision/safety;Caregiver independent with task;Patient able to independently direct caregiver Positioning of UE while sleeping: Minimal assistance;Caregiver independent with task;Patient able to independently direct caregiver     General Comments      Pertinent Vitals/ Pain       Pain Assessment: 0-10 Pain Score: 5  Pain Location: R shoulder Pain Descriptors / Indicators: Sore Pain Intervention(s): Limited activity within patient's tolerance;Monitored during session;Premedicated before session;Repositioned  Home Living Family/patient expects to be discharged to:: Private residence Living Arrangements: Spouse/significant other Available Help at Discharge: Family;Available 24 hours/day Type of Home: House Home Access: Stairs to enter [employer] of Steps: 4-5 Entrance Stairs-Rails: Right;Left Home Layout: Multi-level;Able to live on main level with bedroom/bathroom     Bathroom Shower/Tub: Tub/shower unit   Bathroom Toilet: Handicapped height Bathroom Accessibility: Yes   Home Equipment: Environmental consultant - 2 wheels;Shower seat;Cane - single point;Adaptive equipment Adaptive Equipment: Reacher;Sock aid;Long-handled shoe horn        Prior Functioning/Environment Level of Independence: Independent            Frequency Min 2X/week     Progress Toward Goals  OT Goals(current goals can now be found in the care plan section)  Progress towards OT goals: Progressing toward goals  Acute Rehab OT Goals Patient Stated Goal: to go home today OT Goal Formulation:  With patient/family Time For Goal Achievement: 10/21/15 Potential to Achieve Goals: Good ADL Goals Pt Will Perform Upper Body Bathing: with supervision;sitting Pt Will Perform Upper Body Dressing: with supervision;sitting Pt Will Transfer to Toilet: ambulating;regular height toilet;with modified independence Pt Will Perform Toileting - Clothing Manipulation and hygiene: sit to/from stand;with modified independence Pt/caregiver will Perform Home  Exercise Program: Increased ROM;Right Upper extremity;Independently;With written HEP provided Additional ADL Goal #1: Pt/caregiver will independently don/doff sling.  Plan Discharge plan remains appropriate    Co-evaluation                 End of Session Equipment Utilized During Treatment: Other (comment) (sling)   Activity Tolerance Patient tolerated treatment well   Patient Left in bed;with call bell/phone within reach;with family/visitor present (sitting EOB; daughter assisting with dressing)   Nurse Communication Other (comment) (pt ready for d/c from OT standpoint)        Time: 1610-9604 OT Time Calculation (min): 16 min  Charges: OT General Charges $OT Visit: 1 Procedure OT Evaluation $OT Eval Moderate Complexity: 1 Procedure OT Treatments $Therapeutic Exercise: 8-22 mins  Gaye Alken M.S., OTR/L Pager: [phone]   10/07/2015, 10:49 AM

## 2015-10-07 NOTE — Progress Notes (Signed)
Orthopedics Progress Note  Subjective: Patient reports feeling better today and wants to go home  Objective:  Filed Vitals:   10/06/15 2025 10/07/15 0458  BP: 113/67 115/61  Pulse: 76 78  Temp: 98.1 F (36.7 C) 98.6 F (37 C)  Resp: 18 18    General: Awake and alert  Musculoskeletal: right shoulder incision clean and dry. No drainage, moderate swelling noted Neurovascularly intact  Lab Results  Component Value Date   WBC 6.3 09/28/2015   HGB 11.0* 10/07/2015   HCT 35.4* 10/07/2015   MCV 95.2 09/28/2015   PLT 278 09/28/2015       Component Value Date/Time   NA 132* 10/07/2015 0457   K 4.0 10/07/2015 0457   CL 98* 10/07/2015 0457   CO2 27 10/07/2015 0457   GLUCOSE 104* 10/07/2015 0457   BUN 20 10/07/2015 0457   CREATININE 1.68* 10/07/2015 0457   CALCIUM 8.8* 10/07/2015 0457   GFRNONAA 30* 10/07/2015 0457   GFRAA 35* 10/07/2015 0457    Lab Results  Component Value Date   INR 1.29 04/20/2015   INR 1.17 04/19/2015   INR 0.92 02/06/2011    Assessment/Plan: POD #1 s/p Procedure(s): RIGHT TOTAL SHOULDER ARTHROPLASTY OT this morning.  D/C to home later today  Almedia Balls. Ranell Patrick, MD 10/07/2015 9:14 AM

## 2015-10-07 NOTE — Progress Notes (Signed)
Discharge instruction gave to patient and all questions answered. Patient received her paper prescriptions and her IV access is removed with out complications. Patient is ready to discharge.

## 2015-10-07 NOTE — Care Management Note (Signed)
CM attempted to contact patient's worker's Comp adjuster is: Jade Walker [phone] to provide home health care orders. The office is currently closed. CM spoke with the patient and obtained an additional number [phone]. The office is closed. Informed the patient the weekday CM will provide the information to [facility] requesting set-up of her home health service. Patient verbalizes understanding. Physicist, medical BSN CCM

## 2015-10-07 NOTE — Evaluation (Signed)
Occupational Therapy Evaluation Patient Details Name: Jade Walker MRN: [id number] DOB: 24-Dec-1945 Today's Date: 10/07/2015    History of Present Illness Pt is a 70 y.o. female s/p RIGHT TOTAL SHOULDER ARTHROPLASTY on 10/06/2015. PMH: HTN, asthma, Shortness of breath dyspnea.    Clinical Impression   Pt reports she was independent with ADLs PTA. Currently pt is overall supervision for safety with ADLs and functional mobility with the exception of min assist for UB ADLs. Began safety, ADL, and shoulder education with pt and family; they verbalize understanding. Pt planning to d/c home with 24/7 supervision from her husband. Pt would benefit from continued skilled OT to address established goals.     Follow Up Recommendations  Supervision/Assistance - 24 hour;Other (comment) (Follow up per MD)    Equipment Recommendations  None recommended by OT    Recommendations for Other Services       Precautions / Restrictions Precautions Precautions: Shoulder;Fall Type of Shoulder Precautions: Active protocol: AROM/PROM FF 90, ABD 60, ER 30. AROM elbow, wrist, hand OK. Shoulder Interventions: Shoulder sling/immobilizer;For comfort (and sleep) Precaution Booklet Issued: Yes (comment) Precaution Comments: Educated pt and family on precautions. Required Braces or Orthoses: Sling Restrictions Weight Bearing Restrictions: Yes RUE Weight Bearing: Non weight bearing      Mobility Bed Mobility Overal bed mobility: Needs Assistance Bed Mobility: Supine to Sit     Supine to sit: Supervision;HOB elevated     General bed mobility comments: Supervision for safety; no physical assist required. VCs for technique.  Transfers Overall transfer level: Needs assistance Equipment used: None Transfers: Sit to/from Stand Sit to Stand: Supervision         General transfer comment: Supervision for safety; no physical assist. Good hand placement and technique.     Balance Overall balance  assessment: No apparent balance deficits (not formally assessed)                                          ADL Overall ADL's : Needs assistance/impaired Eating/Feeding: Set up;Sitting   Grooming: Supervision/safety;Standing   Upper Body Bathing: Minimal assitance;Sitting Upper Body Bathing Details (indicate cue type and reason): Educated on UB bathing technique.     Upper Body Dressing : Minimal assistance;Sitting Upper Body Dressing Details (indicate cue type and reason): Educated on UB dressing technique. Lower Body Dressing: Supervision/safety;Sit to/from stand Lower Body Dressing Details (indicate cue type and reason): Pt able to don socks in sitting. Supervision for sit to stand. Toilet Transfer: Supervision/safety;Ambulation;Regular Teacher, adult education Details (indicate cue type and reason): Simulated by transfer from EOB to chair. Toileting- Clothing Manipulation and Hygiene: Supervision/safety;Sit to/from stand       Functional mobility during ADLs: Supervision/safety General ADL Comments: Pts husband and daughter present for OT eval. Educated on positioning on RUE in bed/chair, sling management and wear schedule, ice for edema and pain, home safety; pt verbalized understanding.     Vision     Perception     Praxis      Pertinent Vitals/Pain Pain Assessment: 0-10 Pain Score: 5  Pain Location: R shoulder Pain Descriptors / Indicators: Aching;Sore Pain Intervention(s): Limited activity within patient's tolerance;Monitored during session;Repositioned;Ice applied     Hand Dominance Right   Extremity/Trunk Assessment Upper Extremity Assessment Upper Extremity Assessment: RUE deficits/detail RUE Deficits / Details: Finger, wrist AROM WFL. Overall strength WFL for s/p TSA. RUE: Unable to fully assess due to  immobilization;Unable to fully assess due to pain   Lower Extremity Assessment Lower Extremity Assessment: Overall WFL for tasks assessed    Cervical / Trunk Assessment Cervical / Trunk Assessment: Normal   Communication Communication Communication: No difficulties   Cognition Arousal/Alertness: Awake/alert Behavior During Therapy: WFL for tasks assessed/performed Overall Cognitive Status: Within Functional Limits for tasks assessed                     General Comments       Exercises Exercises: Shoulder     Shoulder Instructions Shoulder Instructions Donning/doffing shirt without moving shoulder: Minimal assistance (educated) Method for sponge bathing under operated UE: Minimal assistance (educated) Donning/doffing sling/immobilizer: Moderate assistance (educated) Correct positioning of sling/immobilizer: Minimal assistance (educated) Sling wearing schedule (on at all times/off for ADL's): Supervision/safety (educated) Proper positioning of operated UE when showering: Supervision/safety (educated) Positioning of UE while sleeping: Minimal assistance (educated)    Home Living Family/patient expects to be discharged to:: Private residence Living Arrangements: Spouse/significant other Available Help at Discharge: Family;Available 24 hours/day Type of Home: House Home Access: Stairs to enter [employer] of Steps: 4-5 Entrance Stairs-Rails: Right;Left Home Layout: Multi-level;Able to live on main level with bedroom/bathroom     Bathroom Shower/Tub: Tub/shower unit   Bathroom Toilet: Handicapped height Bathroom Accessibility: Yes   Home Equipment: Environmental consultant - 2 wheels;Shower seat;Cane - single point;Adaptive equipment Adaptive Equipment: Reacher;Sock aid;Long-handled shoe horn        Prior Functioning/Environment Level of Independence: Independent             OT Diagnosis: Acute pain   OT Problem List: Decreased range of motion;Decreased knowledge of use of DME or AE;Decreased knowledge of precautions;Pain;Impaired UE functional use   OT Treatment/Interventions: Self-care/ADL  training;Therapeutic exercise;Energy conservation;DME and/or AE instruction;Patient/family education    OT Goals(Current goals can be found in the care plan section) Acute Rehab OT Goals Patient Stated Goal: to go home today OT Goal Formulation: With patient/family Time For Goal Achievement: 10/21/15 Potential to Achieve Goals: Good ADL Goals Pt Will Perform Upper Body Bathing: with supervision;sitting Pt Will Perform Upper Body Dressing: with supervision;sitting Pt Will Transfer to Toilet: ambulating;regular height toilet;with modified independence Pt Will Perform Toileting - Clothing Manipulation and hygiene: sit to/from stand;with modified independence Pt/caregiver will Perform Home Exercise Program: Increased ROM;Right Upper extremity;Independently;With written HEP provided Additional ADL Goal #1: Pt/caregiver will independently don/doff sling.  OT Frequency: Min 2X/week   Barriers to D/C:            Co-evaluation              End of Session Equipment Utilized During Treatment: Other (comment) (sling) Nurse Communication: Weight bearing status;Mobility status  Activity Tolerance: Patient tolerated treatment well Patient left: in chair;with call bell/phone within reach;with family/visitor present   Time: 4098-1191 OT Time Calculation (min): 17 min Charges:  OT General Charges $OT Visit: 1 Procedure OT Evaluation $OT Eval Moderate Complexity: 1 Procedure G-Codes:     Gaye Alken M.S., OTR/L Pager: [phone]  10/07/2015, 10:17 AM

## 2015-10-09 MED FILL — Thrombin For Soln 20000 Unit: CUTANEOUS | Qty: 1 | Status: AC

## 2015-10-10 ENCOUNTER — Encounter (HOSPITAL_COMMUNITY): Payer: Self-pay | Admitting: Orthopedic Surgery

## 2016-11-07 NOTE — H&P (Signed)
Jade Walker is an 71 y.o. female.    Chief Complaint: left shoulder pain  HPI: Pt is a 71 y.o. female complaining of left shoulder pain for multiple years. Pain had continually increased since the beginning. X-rays in the clinic show end-stage arthritic changes of the left shoulder. Pt has tried various conservative treatments which have failed to alleviate their symptoms, including injections and therapy. Various options are discussed with the patient. Risks, benefits and expectations were discussed with the patient. Patient understand the risks, benefits and expectations and wishes to proceed with surgery.   PCP:  [name], [name], PA  D/C Plans: Home  PMH: Past Medical History:  Diagnosis Date  . Anxiety   . 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 Asthma   . CKD (chronic kidney disease), stage III   . Complication of anesthesia    blood loss after back surgery because hernia was scratched and "torn" during intubation (02/15/11 EGD showed GIB d/t esophageal ulcer)  . COPD (chronic obstructive pulmonary disease) (HCC)   . Environmental and seasonal allergies   . Full dentures   . GERD (gastroesophageal reflux disease)   . Headache    sinus  . History of hiatal hernia   . History of kidney stones   . History of stress test 2012   pt. reports it was resulted normal-HPR  . Hypertension   . Hypothyroidism   . Iron deficiency anemia    from Care Everywhere  . Osteoporosis   . Pneumonia   . Shortness of breath dyspnea    climbing steps  . Stress incontinence    from Care Everywhere    PSH: Past Surgical History:  Procedure Laterality Date  . ABDOMINAL HYSTERECTOMY    . BACK SURGERY     lumbar x 3, 1 thoracic spine  . CHOLECYSTECTOMY    . COLONOSCOPY    . EYE SURGERY Bilateral    cataract surgery with lens implant  . FRACTURE SURGERY    . HERNIA REPAIR     hiatal hernia "removed"- HPR, 1990  . JOINT REPLACEMENT Left 04/2105   knee  .  OPEN REDUCTION INTERNAL FIXATION (ORIF) DISTAL RADIAL FRACTURE Left 03/14/2015   Procedure: OPEN REDUCTION INTERNAL FIXATION (ORIF) LEFT DISTAL RADIUS FRACTURE;  Surgeon: Cammy CopaScott Gregory Dean, MD;  Location: [facility] OR;  Service: Orthopedics;  Laterality: Left;  . TONSILLECTOMY    . TOTAL KNEE ARTHROPLASTY Left 04/18/2015   Procedure: TOTAL KNEE ARTHROPLASTY;  Surgeon: Cammy CopaScott Gregory Dean, MD;  [facility];  Service: Orthopedics;  Laterality: Left;  . TOTAL SHOULDER ARTHROPLASTY Right 10/06/2015   Procedure: RIGHT TOTAL SHOULDER ARTHROPLASTY;  Surgeon: Beverely LowSteve Norris, MD;  [facility];  Service: Orthopedics;  Laterality: Right;    Social History:  reports that she has never smoked. She has never used smokeless tobacco. She reports that she does not drink alcohol or use drugs.  Allergies:  Allergies  Allergen Reactions  . Codeine Other (See Comments)    Large doses make her sweat, heart flutter  . Sulfamethoxazole-Trimethoprim Rash    Medications: No current facility-administered medications for this encounter.    Current Outpatient Prescriptions  Medication Sig Dispense Refill  . albuterol (PROVENTIL) (2.5 MG/3ML) 0.083% nebulizer solution Take 2.5 mg by nebulization every 6 (six) hours as needed for wheezing or shortness of breath. i puff once a day as needed    . cetirizine (ZYRTEC) 10 MG tablet Take 10 mg by mouth daily.    .Marland Kitchen  citalopram (CELEXA) 10 MG tablet Take 10 mg by mouth daily.    Marland Kitchen dextromethorphan-guaiFENesin (MUCINEX DM) 30-600 MG per 12 hr tablet Take 1 tablet by mouth 2 (two) times daily as needed for cough (or congestion).    . Diethylpropion HCl CR 75 MG TB24 Take 75 mg by mouth daily.    . ferrous sulfate 325 (65 FE) MG EC tablet Take 325 mg by mouth daily with breakfast.    . Fluticasone-Salmeterol (ADVAIR) 250-50 MCG/DOSE AEPB Inhale 1 puff into the lungs 2 (two) times daily.    Marland Kitchen levothyroxine (SYNTHROID, LEVOTHROID) 25 MCG tablet Take 25 mcg by mouth daily before  breakfast.    . Liniments (SALONPAS ARTHRITIS PAIN RELIEF EX) Apply 1 patch topically daily as needed (Back and sholders).    . losartan-hydrochlorothiazide (HYZAAR) 50-12.5 MG per tablet Take 1 tablet by mouth daily.  5  . omeprazole (PRILOSEC) 40 MG capsule Take 40 mg by mouth 2 (two) times daily.    Marland Kitchen oxyCODONE (OXY IR/ROXICODONE) 5 MG immediate release tablet Take 1-2 tablets (5-10 mg total) by mouth every 3 (three) hours as needed for breakthrough pain. 60 tablet 0  . oxyCODONE (ROXICODONE) 5 MG immediate release tablet Take 1-2 tablets (5-10 mg total) by mouth every 4 (four) hours as needed for severe pain. 60 tablet 0  . tiZANidine (ZANAFLEX) 4 MG tablet Take 4 mg by mouth every 8 (eight) hours as needed for muscle spasms.    Marland Kitchen tiZANidine (ZANAFLEX) 4 MG tablet Take 1 tablet (4 mg total) by mouth every 6 (six) hours as needed for muscle spasms. 30 tablet 0  . Vitamin D, Ergocalciferol, (DRISDOL) 50000 UNITS CAPS capsule Take 50,000 Units by mouth every 7 (seven) days. On Monday.      No results found for this or any previous visit (from the past 48 hour(s)). No results found.  ROS: Pain with rom of the left upper extremity  Physical Exam:  Alert and oriented 71 y.o. female in no acute distress Cranial nerves 2-12 intact Cervical spine: full rom with no tenderness, nv intact distally Chest: active breath sounds bilaterally, no wheeze rhonchi or rales Heart: regular rate and rhythm, no murmur Abd: non tender non distended with active bowel sounds Hip is stable with rom  Left shoulder with moderate crepitus with rom nv intact distally No rashes or edema  Assessment/Plan Assessment: left shoulder end stage osteoarthritis  Plan: Patient will undergo a left shoulder end stage osteoarthritis by Dr. Ranell Patrick [facility]. Risks benefits and expectations were discussed with the patient. Patient understand risks, benefits and expectations and wishes to proceed.

## 2016-11-15 ENCOUNTER — Encounter (HOSPITAL_COMMUNITY): Payer: Self-pay

## 2016-11-15 NOTE — Pre-Procedure Instructions (Signed)
Jade PullingBrenda H Walker  11/15/2016      [id number] - [place], [place] - [address] [place] Jade Walker [id number] Phone: [phone] Fax: [phone]    Your procedure is scheduled on Friday March 16.  [facility] at 10:00 A.M.  Call this number if you have problems the morning of surgery:  [phone]   Remember:  Do not eat food or drink liquids after midnight.  Take these medicines the morning of surgery with A SIP OF WATER: cetirizine (zyrtec), citalopram (celexa), famotidine (pepcid), levothyroxine (synthroid), oxycodone if needed, albuterol if needed (bring inhaler to hospital with you)  7 days prior to surgery STOP taking any Aspirin, Aleve, Naproxen, Ibuprofen, Motrin, Advil, Goody's, BC's, all herbal medications, fish oil, and all vitamins   Do not wear jewelry, make-up or nail polish.  Do not wear lotions, powders, or perfumes, or deoderant.  Do not shave 48 hours prior to surgery.  Men may shave face and neck.  Do not bring valuables to the hospital.  [facility] is not responsible for any belongings or valuables.  Contacts, dentures or bridgework may not be worn into surgery.  Leave your suitcase in the car.  After surgery it may be brought to your room.  For patients admitted to the hospital, discharge time will be determined by your treatment team.  Patients discharged the day of surgery will not be allowed to drive home.    Special instructions:    [place]- Preparing For Surgery  Before surgery, you can play an important role. Because skin is not sterile, your skin needs to be as free of germs as possible. You can reduce the number of germs on your skin by washing with CHG (chlorahexidine gluconate) Soap before surgery.  CHG is an antiseptic cleaner which kills germs and bonds with the skin to continue killing germs even after washing.  Please do not use if you have an allergy  to CHG or antibacterial soaps. If your skin becomes reddened/irritated stop using the CHG.  Do not shave (including legs and underarms) for at least 48 hours prior to first CHG shower. It is OK to shave your face.  Please follow these instructions carefully.   1. Shower the NIGHT BEFORE SURGERY and the MORNING OF SURGERY with CHG.   2. If you chose to wash your hair, wash your hair first as usual with your normal shampoo.  3. After you shampoo, rinse your hair and body thoroughly to remove the shampoo.  4. Use CHG as you would any other liquid soap. You can apply CHG directly to the skin and wash gently with a scrungie or a clean washcloth.   5. Apply the CHG Soap to your body ONLY FROM THE NECK DOWN.  Do not use on open wounds or open sores. Avoid contact with your eyes, ears, mouth and genitals (private parts). Wash genitals (private parts) with your normal soap.  6. Wash thoroughly, paying special attention to the area where your surgery will be performed.  7. Thoroughly rinse your body with warm water from the neck down.  8. DO NOT shower/wash with your normal soap after using and rinsing off the CHG Soap.  9. Pat yourself dry with a CLEAN TOWEL.   10. Wear CLEAN PAJAMAS   11. Place CLEAN SHEETS on your bed the night of your first shower and DO NOT SLEEP WITH PETS.  Day of Surgery: Do not apply any deodorants/lotions. Please wear clean clothes to the hospital/surgery center.      Please read over the following fact sheets that you were given. MRSA Information

## 2016-11-18 ENCOUNTER — Encounter (HOSPITAL_COMMUNITY)
Admission: RE | Admit: 2016-11-18 | Discharge: 2016-11-18 | Disposition: A | Discharge status: Home / Self Care | Payer: Medicare HMO | Source: Ambulatory Visit | Attending: Orthopedic Surgery | Admitting: Orthopedic Surgery

## 2016-11-18 ENCOUNTER — Encounter (HOSPITAL_COMMUNITY): Payer: Self-pay

## 2016-11-18 DIAGNOSIS — M19012 Primary osteoarthritis, left shoulder: Secondary | ICD-10-CM | POA: Diagnosis not present

## 2016-11-18 DIAGNOSIS — Z01812 Encounter for preprocedural laboratory examination: Secondary | ICD-10-CM | POA: Diagnosis present

## 2016-11-18 DIAGNOSIS — M25512 Pain in left shoulder: Secondary | ICD-10-CM | POA: Diagnosis not present

## 2016-11-18 LAB — SURGICAL PCR SCREEN
MRSA, PCR: NEGATIVE
Staphylococcus aureus: NEGATIVE

## 2016-11-18 LAB — CBC
HEMATOCRIT: 40.7 % (ref 36.0–46.0)
Hemoglobin: 13.1 g/dL (ref 12.0–15.0)
MCH: 31.6 pg (ref 26.0–34.0)
MCHC: 32.2 g/dL (ref 30.0–36.0)
MCV: 98.1 fL (ref 78.0–100.0)
PLATELETS: 268 10*3/uL (ref 150–400)
RBC: 4.15 MIL/uL (ref 3.87–5.11)
RDW: 12.8 % (ref 11.5–15.5)
WBC: 9.4 10*3/uL (ref 4.0–10.5)

## 2016-11-18 LAB — BASIC METABOLIC PANEL
Anion gap: 9 (ref 5–15)
BUN: 14 mg/dL (ref 6–20)
CHLORIDE: 99 mmol/L — AB (ref 101–111)
CO2: 29 mmol/L (ref 22–32)
CREATININE: 1.93 mg/dL — AB (ref 0.44–1.00)
Calcium: 9.4 mg/dL (ref 8.9–10.3)
GFR calc non Af Amer: 25 mL/min — ABNORMAL LOW (ref >60)
GFR, EST AFRICAN AMERICAN: 29 mL/min — AB (ref >60)
Glucose, Bld: 93 mg/dL (ref 65–99)
POTASSIUM: 4 mmol/L (ref 3.5–5.1)
SODIUM: 137 mmol/L (ref 135–145)

## 2016-11-18 NOTE — Progress Notes (Signed)
PCP: Beane  Pt states she sees a kidney doctor in [place], possibly Dr. Gerome SamPuschinsky? For CKDIII  Pt reports stress test years ago, see Angela's note from 09/28/15 reports stress echo in 2012 ECHO: 08/15/16  Pt denies having cardiologist or cardiac hx, Pt reports having EKG in last year as well at [place] [facility] when they did her echocardiogram, requested from [facility], if not received will need to to DOS. Pt denies having any recent chest pain. No SOB or signs of infection. Pt states she was worked up for chest pain a few years ago but this pain was stated to be d/t her hernia.   Chart forwarded to anesthesia for review.

## 2016-11-19 NOTE — Progress Notes (Addendum)
Anesthesia Chart Review: Patient is a 71 year old female scheduled for left total shoulder on 11/22/16 by Dr. Veverly Fells.   History includes never smoker, CKD (stage III), GERD, anxiety, stress incontinence, iron deficiency anemia, hiatal hernia, hernia repair, HTN, asthma, hypothyroidism, nephrolithiasis, asthma, exertional dyspnea,  hysterectomy, tonsillectomy, cholecystectomy, decompressive laminectomy L4-3 with PLIF 0/8/65 complicated by post-operative hematemesis that evening (EGD showed esophageal ulcer), ORIF radial fracture 03/14/15, left TKA 04/18/15, right total shoulder 10/06/15. BMI is consistent with obesity.  - PCP is Lanier Prude, PA-C [facility]; see Care Everywhere). Office visit on 10/14/16 for pre-operative evaluation. Note indicates, "Patient is medically cleared for left total shoulder surgery with Antionette Char." - Nephrologist is Dr. Omer Jack with [facility].Last visit 09/17/16. His notes indicate that Cr ranged between 1.6-1.8 from 02/2015-02/2016. Labs on 08/30/16 showed Cr 1.65 with  - Pulmonologist is Dr. Elta Guadeloupe Donor ([place], [facility]; see Care Everywhere).  Meds include albuterol, Zyrtec, Celexa, Pepid, ferrous sulfate, levothyroxine, losartan, Robaxin, oxycodone, Zanaflex.   BP 122/60   Pulse 93   Temp 36.8 C   Resp 20   Ht 5' 2"  (1.575 m)   Wt 184 lb 11.2 oz (83.8 kg)   SpO2 96%   BMI 33.78 kg/m   Patient thought she had an EKG at the time of her echo in 08/2016, but no EKG at [facility] or in [facility], [place], or [facility] I also called Dr. Madolyn Frieze office and there is not EKG there either. She will need an EKG on the day of surgery.   Echo 08/15/16 ([place]; Care Everywhere): Summary Ejection fraction is visually estimated at 55-60% Normal left ventricular systolic function Borderline left ventricular hypertrophy Trace tricuspid regurgitation. Trace mitral regurgitation.  Stress echo 10/30/10 [facility]; scanned under Media tab, Correspondence dated 02/17/2011): Poor exercise tolerance. Low normal resting LV systolic function 78%. Hypertensive resting BP with normal response. LV function at peak exercise in not hyperdynamic but there are no focal wall motion abnormalities. Negative exercise treadmill test. Equivocal stress echo with no focal wall motion abnormality post exercise, but without hyperdynamic response.   Renal U/S 08/15/16 [facility]; Care Everywhere): Impression: Mildly atrophic kidneys bilaterally (right kidney 7.1 cm, left 9.0 cm). Normal echotexture. No hydronephrosis.   CXR 03/29/16 [facility]; Care Everywhere): IMPRESSION: Chronic lung changes without evidence of acute cardiopulmonary disease. Large hiatal hernia.  Preoperative labs reviewed.BUN 14, Cr 1.93, eGFR 25. (Cr has primarily been 1.6-1.70 range since 02/2016.)  I called and reviewed renal labs with Jeani Hawking at Dr. Madolyn Frieze office. She reviewed with him, and he thought patient would be okay for surgery but recommended for her to stay well hydrated. He will continue to follow-up out-patient.    If EKG stable and otherwise no acute changes then I anticipate that she can proceed as planned.  George Hugh [facility] Short Stay Center/Anesthesiology Phone [phone] 11/19/2016 4:45 PM

## 2016-11-21 MED ORDER — CEFAZOLIN SODIUM-DEXTROSE 2-4 GM/100ML-% IV SOLN
2.0000 g | INTRAVENOUS | Status: AC
  Administered 2016-11-22: 2 g via INTRAVENOUS
  Filled 2016-11-21: qty 100

## 2016-11-22 ENCOUNTER — Encounter (HOSPITAL_COMMUNITY)
Admission: RE | Disposition: A | Discharge status: Home / Self Care | Payer: Self-pay | Source: Ambulatory Visit | Attending: Orthopedic Surgery

## 2016-11-22 ENCOUNTER — Inpatient Hospital Stay (HOSPITAL_COMMUNITY): Payer: Medicare HMO | Admitting: Vascular Surgery

## 2016-11-22 ENCOUNTER — Inpatient Hospital Stay (HOSPITAL_COMMUNITY)
Admission: RE | Admit: 2016-11-22 | Discharge: 2016-11-23 | DRG: 483 | Disposition: A | Discharge status: Home / Self Care | Payer: Medicare HMO | Source: Ambulatory Visit | Attending: Orthopedic Surgery | Admitting: Orthopedic Surgery

## 2016-11-22 ENCOUNTER — Encounter (HOSPITAL_COMMUNITY): Payer: Self-pay | Admitting: *Deleted

## 2016-11-22 ENCOUNTER — Inpatient Hospital Stay (HOSPITAL_COMMUNITY): Payer: Medicare HMO | Admitting: Anesthesiology

## 2016-11-22 ENCOUNTER — Inpatient Hospital Stay (HOSPITAL_COMMUNITY): Payer: Medicare HMO

## 2016-11-22 DIAGNOSIS — I129 Hypertensive chronic kidney disease with stage 1 through stage 4 chronic kidney disease, or unspecified chronic kidney disease: Secondary | ICD-10-CM | POA: Diagnosis present

## 2016-11-22 DIAGNOSIS — M19012 Primary osteoarthritis, left shoulder: Principal | ICD-10-CM | POA: Diagnosis present

## 2016-11-22 DIAGNOSIS — Z96611 Presence of right artificial shoulder joint: Secondary | ICD-10-CM | POA: Diagnosis present

## 2016-11-22 DIAGNOSIS — J449 Chronic obstructive pulmonary disease, unspecified: Secondary | ICD-10-CM | POA: Diagnosis present

## 2016-11-22 DIAGNOSIS — N183 Chronic kidney disease, stage 3 (moderate): Secondary | ICD-10-CM | POA: Diagnosis present

## 2016-11-22 DIAGNOSIS — Z96652 Presence of left artificial knee joint: Secondary | ICD-10-CM | POA: Diagnosis present

## 2016-11-22 DIAGNOSIS — E039 Hypothyroidism, unspecified: Secondary | ICD-10-CM | POA: Diagnosis present

## 2016-11-22 DIAGNOSIS — Z961 Presence of intraocular lens: Secondary | ICD-10-CM | POA: Diagnosis present

## 2016-11-22 DIAGNOSIS — M25512 Pain in left shoulder: Secondary | ICD-10-CM | POA: Diagnosis present

## 2016-11-22 DIAGNOSIS — Z79899 Other long term (current) drug therapy: Secondary | ICD-10-CM | POA: Diagnosis not present

## 2016-11-22 DIAGNOSIS — K219 Gastro-esophageal reflux disease without esophagitis: Secondary | ICD-10-CM | POA: Diagnosis present

## 2016-11-22 DIAGNOSIS — M810 Age-related osteoporosis without current pathological fracture: Secondary | ICD-10-CM | POA: Diagnosis present

## 2016-11-22 DIAGNOSIS — Z9841 Cataract extraction status, right eye: Secondary | ICD-10-CM | POA: Diagnosis not present

## 2016-11-22 DIAGNOSIS — F419 Anxiety disorder, unspecified: Secondary | ICD-10-CM | POA: Diagnosis present

## 2016-11-22 DIAGNOSIS — Z9842 Cataract extraction status, left eye: Secondary | ICD-10-CM

## 2016-11-22 DIAGNOSIS — Z96612 Presence of left artificial shoulder joint: Secondary | ICD-10-CM

## 2016-11-22 HISTORY — PX: TOTAL SHOULDER ARTHROPLASTY: SHX126

## 2016-11-22 SURGERY — ARTHROPLASTY, SHOULDER, TOTAL
Anesthesia: General | Site: Shoulder | Laterality: Left

## 2016-11-22 MED ORDER — POLYETHYLENE GLYCOL 3350 17 G PO PACK: 17.0000 g | PACK | Freq: Every day | ORAL | Status: DC | PRN

## 2016-11-22 MED ORDER — MENTHOL 3 MG MT LOZG
1.0000 | LOZENGE | OROMUCOSAL | Status: DC | PRN
Start: 2016-11-22 — End: 2016-11-23

## 2016-11-22 MED ORDER — PHENOL 1.4 % MT LIQD: 1.0000 | OROMUCOSAL | Status: DC | PRN

## 2016-11-22 MED ORDER — METHOCARBAMOL 500 MG PO TABS
500.0000 mg | ORAL_TABLET | Freq: Four times a day (QID) | ORAL | Status: DC | PRN
  Administered 2016-11-22 – 2016-11-23 (×2): 500 mg via ORAL
  Filled 2016-11-22 (×2): qty 1

## 2016-11-22 MED ORDER — ONDANSETRON HCL 4 MG/2ML IJ SOLN: 4.0000 mg | Freq: Once | INTRAMUSCULAR | Status: DC | PRN

## 2016-11-22 MED ORDER — MIDAZOLAM HCL 2 MG/2ML IJ SOLN
1.0000 mg | Freq: Once | INTRAMUSCULAR | Status: AC
  Administered 2016-11-22: 1 mg via INTRAVENOUS

## 2016-11-22 MED ORDER — OXYCODONE HCL 5 MG PO TABS
5.0000 mg | ORAL_TABLET | ORAL | Status: DC | PRN
  Administered 2016-11-23 (×2): 10 mg via ORAL
  Filled 2016-11-22: qty 2

## 2016-11-22 MED ORDER — CITALOPRAM HYDROBROMIDE 10 MG PO TABS
10.0000 mg | ORAL_TABLET | Freq: Every day | ORAL | Status: DC
  Administered 2016-11-23: 10 mg via ORAL
  Filled 2016-11-22: qty 1

## 2016-11-22 MED ORDER — EPINEPHRINE PF 1 MG/ML IJ SOLN
INTRAMUSCULAR | Status: AC
  Filled 2016-11-22: qty 1

## 2016-11-22 MED ORDER — BUPIVACAINE-EPINEPHRINE (PF) 0.25% -1:200000 IJ SOLN
INTRAMUSCULAR | Status: DC | PRN
  Administered 2016-11-22: 20 mL

## 2016-11-22 MED ORDER — OXYCODONE-ACETAMINOPHEN 5-325 MG PO TABS: 1.0000 | ORAL_TABLET | ORAL | 0 refills | Status: DC | PRN

## 2016-11-22 MED ORDER — LORATADINE 10 MG PO TABS
10.0000 mg | ORAL_TABLET | Freq: Every day | ORAL | Status: DC
Start: 2016-11-23 — End: 2016-11-23
  Administered 2016-11-23: 10 mg via ORAL
  Filled 2016-11-22: qty 1

## 2016-11-22 MED ORDER — DOCUSATE SODIUM 100 MG PO CAPS
100.0000 mg | ORAL_CAPSULE | Freq: Two times a day (BID) | ORAL | Status: DC
  Administered 2016-11-22 – 2016-11-23 (×2): 100 mg via ORAL
  Filled 2016-11-22 (×2): qty 1

## 2016-11-22 MED ORDER — ONDANSETRON HCL 4 MG/2ML IJ SOLN
INTRAMUSCULAR | Status: DC | PRN
Start: 2016-11-22 — End: 2016-11-22
  Administered 2016-11-22: 4 mg via INTRAVENOUS

## 2016-11-22 MED ORDER — FENTANYL CITRATE (PF) 100 MCG/2ML IJ SOLN
25.0000 ug | INTRAMUSCULAR | Status: DC | PRN
  Administered 2016-11-22 (×2): 25 ug via INTRAVENOUS
  Administered 2016-11-22: 50 ug via INTRAVENOUS

## 2016-11-22 MED ORDER — MEPERIDINE HCL 25 MG/ML IJ SOLN: 6.2500 mg | INTRAMUSCULAR | Status: DC | PRN

## 2016-11-22 MED ORDER — ROCURONIUM BROMIDE 50 MG/5ML IV SOSY
PREFILLED_SYRINGE | INTRAVENOUS | Status: AC
  Filled 2016-11-22: qty 5

## 2016-11-22 MED ORDER — ACETAMINOPHEN 650 MG RE SUPP: 650.0000 mg | Freq: Four times a day (QID) | RECTAL | Status: DC | PRN

## 2016-11-22 MED ORDER — 0.9 % SODIUM CHLORIDE (POUR BTL) OPTIME
TOPICAL | Status: DC | PRN
  Administered 2016-11-22: 1000 mL

## 2016-11-22 MED ORDER — PHENYLEPHRINE HCL 10 MG/ML IJ SOLN
INTRAVENOUS | Status: DC | PRN
  Administered 2016-11-22: 25 ug/min via INTRAVENOUS
  Administered 2016-11-22: 15 ug/min via INTRAVENOUS

## 2016-11-22 MED ORDER — LIDOCAINE HCL (CARDIAC) 20 MG/ML IV SOLN
INTRAVENOUS | Status: DC | PRN
  Administered 2016-11-22: 40 mg via INTRAVENOUS

## 2016-11-22 MED ORDER — MIDAZOLAM HCL 2 MG/2ML IJ SOLN
INTRAMUSCULAR | Status: AC
  Administered 2016-11-22: 1 mg via INTRAVENOUS
  Filled 2016-11-22: qty 2

## 2016-11-22 MED ORDER — ONDANSETRON HCL 4 MG/2ML IJ SOLN
INTRAMUSCULAR | Status: AC
  Filled 2016-11-22: qty 2

## 2016-11-22 MED ORDER — METHOCARBAMOL 500 MG PO TABS
ORAL_TABLET | ORAL | Status: AC
  Filled 2016-11-22: qty 1

## 2016-11-22 MED ORDER — FERROUS SULFATE 325 (65 FE) MG PO TABS
325.0000 mg | ORAL_TABLET | Freq: Every day | ORAL | Status: DC
  Administered 2016-11-23: 325 mg via ORAL
  Filled 2016-11-22: qty 1

## 2016-11-22 MED ORDER — TIZANIDINE HCL 4 MG PO TABS: 4.0000 mg | ORAL_TABLET | Freq: Four times a day (QID) | ORAL | Status: DC | PRN

## 2016-11-22 MED ORDER — FAMOTIDINE 20 MG PO TABS
40.0000 mg | ORAL_TABLET | Freq: Two times a day (BID) | ORAL | Status: DC
  Administered 2016-11-22 – 2016-11-23 (×2): 40 mg via ORAL
  Filled 2016-11-22 (×2): qty 2

## 2016-11-22 MED ORDER — BISACODYL 10 MG RE SUPP: 10.0000 mg | Freq: Every day | RECTAL | Status: DC | PRN

## 2016-11-22 MED ORDER — BUPIVACAINE HCL (PF) 0.25 % IJ SOLN
INTRAMUSCULAR | Status: AC
  Filled 2016-11-22: qty 30

## 2016-11-22 MED ORDER — THROMBIN 5000 UNITS EX SOLR
CUTANEOUS | Status: AC
  Filled 2016-11-22: qty 5000

## 2016-11-22 MED ORDER — ALBUTEROL SULFATE (2.5 MG/3ML) 0.083% IN NEBU: 2.5000 mg | INHALATION_SOLUTION | Freq: Four times a day (QID) | RESPIRATORY_TRACT | Status: DC | PRN

## 2016-11-22 MED ORDER — CEFAZOLIN IN D5W 1 GM/50ML IV SOLN
1.0000 g | Freq: Three times a day (TID) | INTRAVENOUS | Status: AC
  Administered 2016-11-22 – 2016-11-23 (×2): 1 g via INTRAVENOUS
  Filled 2016-11-22 (×2): qty 50

## 2016-11-22 MED ORDER — METOCLOPRAMIDE HCL 5 MG/ML IJ SOLN: 5.0000 mg | Freq: Three times a day (TID) | INTRAMUSCULAR | Status: DC | PRN

## 2016-11-22 MED ORDER — HEMOSTATIC AGENTS (NO CHARGE) OPTIME
TOPICAL | Status: DC | PRN
  Administered 2016-11-22: 1

## 2016-11-22 MED ORDER — BUPIVACAINE-EPINEPHRINE 0.25% -1:200000 IJ SOLN
INTRAMUSCULAR | Status: DC | PRN
  Administered 2016-11-22: 9 mL

## 2016-11-22 MED ORDER — OXYCODONE HCL 5 MG PO TABS
5.0000 mg | ORAL_TABLET | ORAL | Status: DC | PRN
  Administered 2016-11-22 – 2016-11-23 (×3): 10 mg via ORAL
  Filled 2016-11-22 (×4): qty 2

## 2016-11-22 MED ORDER — FENTANYL CITRATE (PF) 100 MCG/2ML IJ SOLN
INTRAMUSCULAR | Status: DC | PRN
  Administered 2016-11-22 (×2): 50 ug via INTRAVENOUS

## 2016-11-22 MED ORDER — MORPHINE SULFATE (PF) 2 MG/ML IV SOLN: 2.0000 mg | INTRAVENOUS | Status: DC | PRN

## 2016-11-22 MED ORDER — FENTANYL CITRATE (PF) 100 MCG/2ML IJ SOLN
50.0000 ug | Freq: Once | INTRAMUSCULAR | Status: AC
  Administered 2016-11-22: 50 ug via INTRAVENOUS

## 2016-11-22 MED ORDER — PROPOFOL 10 MG/ML IV BOLUS
INTRAVENOUS | Status: DC | PRN
  Administered 2016-11-22: 150 mg via INTRAVENOUS

## 2016-11-22 MED ORDER — CHLORHEXIDINE GLUCONATE 4 % EX LIQD: 60.0000 mL | Freq: Once | CUTANEOUS | Status: DC

## 2016-11-22 MED ORDER — ONDANSETRON HCL 4 MG/2ML IJ SOLN: 4.0000 mg | Freq: Four times a day (QID) | INTRAMUSCULAR | Status: DC | PRN

## 2016-11-22 MED ORDER — METOCLOPRAMIDE HCL 5 MG PO TABS: 5.0000 mg | ORAL_TABLET | Freq: Three times a day (TID) | ORAL | Status: DC | PRN

## 2016-11-22 MED ORDER — FENTANYL CITRATE (PF) 100 MCG/2ML IJ SOLN
INTRAMUSCULAR | Status: AC
  Filled 2016-11-22: qty 2

## 2016-11-22 MED ORDER — LOSARTAN POTASSIUM 50 MG PO TABS
50.0000 mg | ORAL_TABLET | Freq: Every day | ORAL | Status: DC
  Administered 2016-11-23: 50 mg via ORAL
  Filled 2016-11-22: qty 1

## 2016-11-22 MED ORDER — ONDANSETRON HCL 4 MG PO TABS: 4.0000 mg | ORAL_TABLET | Freq: Four times a day (QID) | ORAL | Status: DC | PRN

## 2016-11-22 MED ORDER — HYDROCHLOROTHIAZIDE 12.5 MG PO CAPS
12.5000 mg | ORAL_CAPSULE | Freq: Every day | ORAL | Status: DC
  Administered 2016-11-23: 12.5 mg via ORAL
  Filled 2016-11-22: qty 1

## 2016-11-22 MED ORDER — FENTANYL CITRATE (PF) 100 MCG/2ML IJ SOLN
INTRAMUSCULAR | Status: AC
  Administered 2016-11-22: 50 ug via INTRAVENOUS
  Filled 2016-11-22: qty 2

## 2016-11-22 MED ORDER — ACETAMINOPHEN 325 MG PO TABS: 650.0000 mg | ORAL_TABLET | Freq: Four times a day (QID) | ORAL | Status: DC | PRN

## 2016-11-22 MED ORDER — SODIUM CHLORIDE 0.9 % IV SOLN: INTRAVENOUS | Status: DC

## 2016-11-22 MED ORDER — THROMBIN 5000 UNITS EX SOLR
CUTANEOUS | Status: DC | PRN
  Administered 2016-11-22: 5000 [IU] via TOPICAL

## 2016-11-22 MED ORDER — LIDOCAINE 2% (20 MG/ML) 5 ML SYRINGE
INTRAMUSCULAR | Status: AC
  Filled 2016-11-22: qty 5

## 2016-11-22 MED ORDER — ROCURONIUM BROMIDE 100 MG/10ML IV SOLN
INTRAVENOUS | Status: DC | PRN
  Administered 2016-11-22: 30 mg via INTRAVENOUS

## 2016-11-22 MED ORDER — VITAMIN D (ERGOCALCIFEROL) 1.25 MG (50000 UNIT) PO CAPS: 50000.0000 [IU] | ORAL_CAPSULE | ORAL | Status: DC

## 2016-11-22 MED ORDER — SUGAMMADEX SODIUM 200 MG/2ML IV SOLN
INTRAVENOUS | Status: DC | PRN
  Administered 2016-11-22: 200 mg via INTRAVENOUS

## 2016-11-22 MED ORDER — SUGAMMADEX SODIUM 200 MG/2ML IV SOLN
INTRAVENOUS | Status: AC
  Filled 2016-11-22: qty 2

## 2016-11-22 MED ORDER — ALBUTEROL SULFATE HFA 108 (90 BASE) MCG/ACT IN AERS: 1.0000 | INHALATION_SPRAY | Freq: Four times a day (QID) | RESPIRATORY_TRACT | Status: DC | PRN

## 2016-11-22 MED ORDER — LOSARTAN POTASSIUM-HCTZ 50-12.5 MG PO TABS: 1.0000 | ORAL_TABLET | Freq: Every day | ORAL | Status: DC

## 2016-11-22 MED ORDER — LEVOTHYROXINE SODIUM 50 MCG PO TABS
50.0000 ug | ORAL_TABLET | Freq: Every day | ORAL | Status: DC
  Administered 2016-11-23: 50 ug via ORAL
  Filled 2016-11-22: qty 1

## 2016-11-22 MED ORDER — SODIUM CHLORIDE 0.9 % IV SOLN
INTRAVENOUS | Status: DC
  Administered 2016-11-22: 10:00:00 via INTRAVENOUS

## 2016-11-22 MED ORDER — BUPROPION HCL ER (SR) 150 MG PO TB12
150.0000 mg | ORAL_TABLET | Freq: Every day | ORAL | Status: DC
  Administered 2016-11-22: 150 mg via ORAL
  Filled 2016-11-22: qty 1

## 2016-11-22 MED ORDER — PROPOFOL 10 MG/ML IV BOLUS
INTRAVENOUS | Status: AC
  Filled 2016-11-22: qty 20

## 2016-11-22 SURGICAL SUPPLY — 76 items
BIT DRILL 5/64X5 DISP (BIT) IMPLANT
BLADE SAG 18X100X1.27 (BLADE) IMPLANT
BLADE SAW SAG 73X25 THK (BLADE) ×4
BLADE SAW SGTL 73X25 THK (BLADE) ×2 IMPLANT
BUR SURG 4X8 MED (BURR) IMPLANT
BURR SURG 4MMX8MM MEDIUM (BURR)
BURR SURG 4X8 MED (BURR)
CAPT HIP TOTAL 2 ×3 IMPLANT
CEMENT HV SMART SET (Cement) ×3 IMPLANT
CLOSURE WOUND 1/2 X4 (GAUZE/BANDAGES/DRESSINGS) ×1
COVER SURGICAL LIGHT HANDLE (MISCELLANEOUS) ×3 IMPLANT
DRAPE IMP U-DRAPE 54X76 (DRAPES) ×3 IMPLANT
DRAPE INCISE IOBAN 66X45 STRL (DRAPES) ×3 IMPLANT
DRAPE ORTHO SPLIT 77X108 STRL (DRAPES) ×4
DRAPE SURG ORHT 6 SPLT 77X108 (DRAPES) ×2 IMPLANT
DRAPE U-SHAPE 47X51 STRL (DRAPES) ×3 IMPLANT
DRAPE X-RAY CASS 24X20 (DRAPES) IMPLANT
DRSG ADAPTIC 3X8 NADH LF (GAUZE/BANDAGES/DRESSINGS) ×3 IMPLANT
DRSG PAD ABDOMINAL 8X10 ST (GAUZE/BANDAGES/DRESSINGS) ×3 IMPLANT
DURAPREP 26ML APPLICATOR (WOUND CARE) ×3 IMPLANT
ELECT BLADE 4.0 EZ CLEAN MEGAD (MISCELLANEOUS) ×3
ELECT NEEDLE TIP 2.8 STRL (NEEDLE) ×3 IMPLANT
ELECT REM PT RETURN 9FT ADLT (ELECTROSURGICAL) ×3
ELECTRODE BLDE 4.0 EZ CLN MEGD (MISCELLANEOUS) ×1 IMPLANT
ELECTRODE REM PT RTRN 9FT ADLT (ELECTROSURGICAL) ×1 IMPLANT
GAUZE SPONGE 4X4 12PLY STRL (GAUZE/BANDAGES/DRESSINGS) ×3 IMPLANT
GLOVE BIOGEL PI ORTHO PRO 7.5 (GLOVE) ×2
GLOVE BIOGEL PI ORTHO PRO SZ7 (GLOVE) ×2
GLOVE BIOGEL PI ORTHO PRO SZ8 (GLOVE) ×2
GLOVE ORTHO TXT STRL SZ7.5 (GLOVE) ×3 IMPLANT
GLOVE PI ORTHO PRO STRL 7.5 (GLOVE) ×1 IMPLANT
GLOVE PI ORTHO PRO STRL SZ7 (GLOVE) ×1 IMPLANT
GLOVE PI ORTHO PRO STRL SZ8 (GLOVE) ×1 IMPLANT
GLOVE SURG ORTHO 8.5 STRL (GLOVE) ×6 IMPLANT
GOWN STRL REUS W/ TWL XL LVL3 (GOWN DISPOSABLE) ×3 IMPLANT
GOWN STRL REUS W/TWL XL LVL3 (GOWN DISPOSABLE) ×6
HANDPIECE INTERPULSE COAX TIP (DISPOSABLE)
KIT BASIN OR (CUSTOM PROCEDURE TRAY) ×3 IMPLANT
KIT ROOM TURNOVER OR (KITS) ×3 IMPLANT
MANIFOLD NEPTUNE II (INSTRUMENTS) ×3 IMPLANT
NDL SUT 6 .5 CRC .975X.05 MAYO (NEEDLE) ×1 IMPLANT
NEEDLE 1/2 CIR MAYO (NEEDLE) ×3 IMPLANT
NEEDLE HYPO 25GX1X1/2 BEV (NEEDLE) ×3 IMPLANT
NEEDLE MAYO TAPER (NEEDLE) ×2
NS IRRIG 1000ML POUR BTL (IV SOLUTION) ×3 IMPLANT
PACK SHOULDER (CUSTOM PROCEDURE TRAY) ×3 IMPLANT
PACK UNIVERSAL I (CUSTOM PROCEDURE TRAY) IMPLANT
PAD ARMBOARD 7.5X6 YLW CONV (MISCELLANEOUS) ×6 IMPLANT
SET HNDPC FAN SPRY TIP SCT (DISPOSABLE) IMPLANT
SLING ARM FOAM STRAP LRG (SOFTGOODS) ×3 IMPLANT
SLING ARM IMMOBILIZER LRG (SOFTGOODS) IMPLANT
SLING ARM IMMOBILIZER MED (SOFTGOODS) IMPLANT
SMARTMIX MINI TOWER (MISCELLANEOUS) ×6
SPONGE LAP 18X18 X RAY DECT (DISPOSABLE) ×3 IMPLANT
SPONGE LAP 4X18 X RAY DECT (DISPOSABLE) ×3 IMPLANT
SPONGE SURGIFOAM ABS GEL SZ50 (HEMOSTASIS) ×3 IMPLANT
STRIP CLOSURE SKIN 1/2X4 (GAUZE/BANDAGES/DRESSINGS) ×2 IMPLANT
SUCTION FRAZIER HANDLE 10FR (MISCELLANEOUS) ×2
SUCTION TUBE FRAZIER 10FR DISP (MISCELLANEOUS) ×1 IMPLANT
SUT FIBERWIRE #2 38 T-5 BLUE (SUTURE) ×15
SUT MNCRL AB 4-0 PS2 18 (SUTURE) ×3 IMPLANT
SUT VIC AB 0 CT1 27 (SUTURE) ×2
SUT VIC AB 0 CT1 27XBRD ANBCTR (SUTURE) ×1 IMPLANT
SUT VIC AB 0 CT2 27 (SUTURE) ×3 IMPLANT
SUT VIC AB 2-0 CT1 27 (SUTURE) ×2
SUT VIC AB 2-0 CT1 TAPERPNT 27 (SUTURE) ×1 IMPLANT
SUT VICRYL AB 2 0 TIES (SUTURE) ×3 IMPLANT
SUTURE FIBERWR #2 38 T-5 BLUE (SUTURE) ×5 IMPLANT
SYR CONTROL 10ML LL (SYRINGE) ×3 IMPLANT
TAPE CLOTH SURG 6X10 WHT LF (GAUZE/BANDAGES/DRESSINGS) IMPLANT
TOWEL OR 17X24 6PK STRL BLUE (TOWEL DISPOSABLE) ×3 IMPLANT
TOWEL OR 17X26 10 PK STRL BLUE (TOWEL DISPOSABLE) ×3 IMPLANT
TOWER SMARTMIX MINI (MISCELLANEOUS) ×2 IMPLANT
TRAY FOLEY W/METER SILVER 16FR (SET/KITS/TRAYS/PACK) IMPLANT
WATER STERILE IRR 1000ML POUR (IV SOLUTION) ×3 IMPLANT
YANKAUER SUCT BULB TIP NO VENT (SUCTIONS) IMPLANT

## 2016-11-22 NOTE — Transfer of Care (Signed)
Immediate Anesthesia Transfer of Care Note  Patient: Jade Walker  Procedure(s) Performed: Procedure(s): TOTAL SHOULDER ARTHROPLASTY (Left)  Patient Location: PACU  Anesthesia Type:GA combined with regional for post-op pain  Level of Consciousness: awake, alert  and oriented  Airway & Oxygen Therapy: Patient Spontanous Breathing and Patient connected to nasal cannula oxygen  Post-op Assessment: Report given to RN, Post -op Vital signs reviewed and stable and Patient moving all extremities X 4  Post vital signs: Reviewed and stable  Last Vitals:  Vitals:   11/22/16 0944 11/22/16 1429  BP: 132/71   Pulse: 77   Resp: 20   Temp: 37.1 C 36.1 C    Last Pain:  Vitals:   11/22/16 1003  TempSrc:   PainSc: 8          Complications: No apparent anesthesia complications 

## 2016-11-22 NOTE — Discharge Instructions (Signed)
Ice to the shoulder as much as you can.  Keep a pillow propped behind the left elbow to keep the arm across your waist.  Wear the sling out of the house, ok to remove inside for exercise.  "hug a pillow"  Do exercises every hour for 5 minutes  No push pull or lift with the left arm.  Follow up in two weeks in the office [phone]

## 2016-11-22 NOTE — Anesthesia Procedure Notes (Signed)
Anesthesia Regional Block: Interscalene brachial plexus block   Pre-Anesthetic Checklist: ,, timeout performed, Correct Patient, Correct Site, Correct Laterality, Correct Procedure, Correct Position, site marked, Risks and benefits discussed,  Surgical consent,  Pre-op evaluation,  At surgeon's request and post-op pain management  Laterality: Left  Prep: chloraprep       Needles:  Injection technique: Single-shot  Needle Type: Echogenic Stimulator Needle     Needle Length: 5cm  Needle Gauge: 22     Additional Needles:   Procedures: ultrasound guided,,,,,,,,  Narrative:  Start time: 11/22/2016 11:40 AM End time: 11/22/2016 11:50 AM  Performed by: Personally  Anesthesiologist: Arma HeadingNIGAM, Joie Hipps  Additional Notes: Patient received sedation Midazolam 1 mg Fentanyl 50 mcg

## 2016-11-22 NOTE — Anesthesia Preprocedure Evaluation (Addendum)
Anesthesia Evaluation  Patient identified by MRN, date of birth, ID band Patient awake    Reviewed: Allergy & Precautions, NPO status , Patient's Chart, lab work & pertinent test results  Airway Mallampati: II  TM Distance: <3 FB Neck ROM: Full    Dental  (+) Edentulous Upper, Edentulous Lower   Pulmonary neg shortness of breath, asthma , pneumonia, resolved, COPD,    breath sounds clear to auscultation       Cardiovascular hypertension,  Rhythm:Regular Rate:Normal     Neuro/Psych    GI/Hepatic hiatal hernia, GERD  ,  Endo/Other  Hypothyroidism   Renal/GU Renal InsufficiencyRenal disease     Musculoskeletal  (+) Arthritis ,   Abdominal   Peds  Hematology  (+) anemia ,   Anesthesia Other Findings   Reproductive/Obstetrics                           Anesthesia Physical Anesthesia Plan  ASA: II  Anesthesia Plan: General   Post-op Pain Management:  Regional for Post-op pain   Induction: Intravenous  Airway Management Planned: Oral ETT  Additional Equipment:   Intra-op Plan:   Post-operative Plan: Extubation in OR  Informed Consent: I have reviewed the patients History and Physical, chart, labs and discussed the procedure including the risks, benefits and alternatives for the proposed anesthesia with the patient or authorized representative who has indicated his/her understanding and acceptance.   Dental advisory given  Plan Discussed with: CRNA  Anesthesia Plan Comments: (See Zelenak PA anesthesia Note . EF 55-60 per echo 12/18)       Anesthesia Quick Evaluation

## 2016-11-22 NOTE — Progress Notes (Signed)
PHARMACY NOTE:  ANTIMICROBIAL RENAL DOSAGE ADJUSTMENT  Current antimicrobial regimen includes a mismatch between antimicrobial dosage and estimated renal function.  As per policy approved by the Pharmacy & Therapeutics and Medical Executive Committees, the antimicrobial dosage will be adjusted accordingly.  Current antimicrobial dosage:  Ancef 2gm IV q6h x3 doses  Indication: surgical prophylaxis  Renal Function: CrCl ~ 25  Estimated Creatinine Clearance: 26.8 mL/min (A) (by C-G formula based on SCr of 1.93 mg/dL (H)).     Antimicrobial dosage has been changed to:  Ancef 1gm IV q8h x2 doses  Thank you for allowing pharmacy to be a part of this patient's care.  Harland GermanAndrew Alic Hilburn, Pharm D 11/22/2016 4:30 PM

## 2016-11-22 NOTE — Brief Op Note (Signed)
11/22/2016  2:28 PM  PATIENT:  Jade Walker  71 y.o. female  PRE-OPERATIVE DIAGNOSIS:  Left shoulder osteoarthritis, end stage  POST-OPERATIVE DIAGNOSIS:  Left shoulder osteoarthritis, end stage  PROCEDURE:  Procedure(s): TOTAL SHOULDER ARTHROPLASTY (Left) [employer]  SURGEON:  Surgeon(s) and Role:    * Beverely LowSteve Jemia Fata, MD - Primary  PHYSICIAN ASSISTANT:   ASSISTANTS: Thea Gisthomas B Dixon, PA-C   ANESTHESIA:   regional and general  EBL:  Total I/O In: 700 [I.V.:700] Out: 150 [Blood:150]  BLOOD ADMINISTERED:none  DRAINS: none   LOCAL MEDICATIONS USED:  MARCAINE     SPECIMEN:  No Specimen  DISPOSITION OF SPECIMEN:  N/A  COUNTS:  YES  TOURNIQUET:  * No tourniquets in log *  DICTATION: .Other Dictation: Dictation Number [id number]  PLAN OF CARE: Admit to inpatient   PATIENT DISPOSITION:  PACU - hemodynamically stable.   Delay start of Pharmacological VTE agent (>24hrs) due to surgical blood loss or risk of bleeding: not applicable

## 2016-11-22 NOTE — Transfer of Care (Signed)
Immediate Anesthesia Transfer of Care Note  Patient: Jade Walker  Procedure(s) Performed: Procedure(s): TOTAL SHOULDER ARTHROPLASTY (Left)  Patient Location: PACU  Anesthesia Type:GA combined with regional for post-op pain  Level of Consciousness: awake, alert  and oriented  Airway & Oxygen Therapy: Patient Spontanous Breathing and Patient connected to nasal cannula oxygen  Post-op Assessment: Report given to RN, Post -op Vital signs reviewed and stable and Patient moving all extremities X 4  Post vital signs: Reviewed and stable  Last Vitals:  Vitals:   11/22/16 0944 11/22/16 1429  BP: 132/71   Pulse: 77   Resp: 20   Temp: 37.1 C 36.1 C    Last Pain:  Vitals:   11/22/16 1003  TempSrc:   PainSc: 8          Complications: No apparent anesthesia complications

## 2016-11-22 NOTE — Anesthesia Postprocedure Evaluation (Signed)
Anesthesia Post Note  Patient: Jade Walker  Procedure(s) Performed: Procedure(s) (LRB): TOTAL SHOULDER ARTHROPLASTY (Left)  Patient location during evaluation: PACU Anesthesia Type: General Level of consciousness: awake Pain management: pain level controlled Vital Signs Assessment: post-procedure vital signs reviewed and stable Respiratory status: spontaneous breathing Cardiovascular status: stable Postop Assessment: no signs of nausea or vomiting Anesthetic complications: no       Last Vitals:  Vitals:   11/22/16 1448 11/22/16 1500  BP: 139/70   Pulse: 71 71  Resp: 14 17  Temp:      Last Pain:  Vitals:   11/22/16 1003  TempSrc:   PainSc: 8                  [employer] Tomisha Reppucci

## 2016-11-22 NOTE — Anesthesia Procedure Notes (Signed)
Procedure Name: Intubation Date/Time: 11/22/2016 12:12 PM Performed by: Rush Farmer E Pre-anesthesia Checklist: Patient identified, Emergency Drugs available, Suction available and Patient being monitored Patient Re-evaluated:Patient Re-evaluated prior to inductionOxygen Delivery Method: Circle system utilized Preoxygenation: Pre-oxygenation with 100% oxygen Intubation Type: IV induction Ventilation: Mask ventilation without difficulty Laryngoscope Size: Mac and 3 Grade View: Grade I Tube type: Oral Tube size: 7.0 mm Number of attempts: 1 Airway Equipment and Method: Stylet Placement Confirmation: ETT inserted through vocal cords under direct vision,  positive ETCO2 and breath sounds checked- equal and bilateral Secured at: 21 cm Tube secured with: Tape Dental Injury: Teeth and Oropharynx as per pre-operative assessment

## 2016-11-22 NOTE — Interval H&P Note (Signed)
History and Physical Interval Note:  11/22/2016 11:41 AM  Jade Walker  has presented today for surgery, with the diagnosis of Left shoulder osteoarthritis  The various methods of treatment have been discussed with the patient and family. After consideration of risks, benefits and other options for treatment, the patient has consented to  Procedure(s): TOTAL SHOULDER ARTHROPLASTY (Left) as a surgical intervention .  The patient's history has been reviewed, patient examined, no change in status, stable for surgery.  I have reviewed the patient's chart and labs.  Questions were answered to the patient's satisfaction.     Ayauna Mcnay,STEVEN R

## 2016-11-23 LAB — BASIC METABOLIC PANEL
ANION GAP: 7 (ref 5–15)
BUN: 13 mg/dL (ref 6–20)
CALCIUM: 8.5 mg/dL — AB (ref 8.9–10.3)
CO2: 26 mmol/L (ref 22–32)
Chloride: 99 mmol/L — ABNORMAL LOW (ref 101–111)
Creatinine, Ser: 1.88 mg/dL — ABNORMAL HIGH (ref 0.44–1.00)
GFR calc Af Amer: 30 mL/min — ABNORMAL LOW (ref >60)
GFR calc non Af Amer: 26 mL/min — ABNORMAL LOW (ref >60)
GLUCOSE: 101 mg/dL — AB (ref 65–99)
POTASSIUM: 3.7 mmol/L (ref 3.5–5.1)
Sodium: 132 mmol/L — ABNORMAL LOW (ref 135–145)

## 2016-11-23 LAB — HEMOGLOBIN AND HEMATOCRIT, BLOOD
HEMATOCRIT: 33 % — AB (ref 36.0–46.0)
Hemoglobin: 10.7 g/dL — ABNORMAL LOW (ref 12.0–15.0)

## 2016-11-23 NOTE — Evaluation (Signed)
Occupational Therapy Evaluation Patient Details Name: Jade Walker MRN: [id number] DOB: 10/14/1945 Today's Date: 11/23/2016    History of Present Illness s/p TOTAL SHOULDER ARTHROPLASTY (Left)   Clinical Impression   Pt admitted with the above diagnoses and presents with below problem list. Pt will benefit from continued acute OT to address the below listed deficits and maximize independence with basic ADLs prior to d/c home. PTA pt independent with ADLs. Pt currently min A with most ADLs. Bed level eval this session due to pt reporting high level of pain (8/10). Focus on education and ROM of elbow/wrist hand in supine this session. Plan to return one more time once pt premedicated to assess transfers/mobility.        Follow Up Recommendations  No OT follow up;Supervision - Intermittent;Other (comment) (OOB/mobility)    Equipment Recommendations  None recommended by OT    Recommendations for Other Services       Precautions / Restrictions Precautions Precautions: Shoulder Type of Shoulder Precautions: Conservative Shoulder Interventions: Shoulder sling/immobilizer;At all times;Off for dressing/bathing/exercises Precaution Booklet Issued: Yes (comment) Precaution Comments: reviewed Required Braces or Orthoses: Sling Restrictions Weight Bearing Restrictions: Yes LUE Weight Bearing: Non weight bearing Other Position/Activity Restrictions: No AROM or PROM of L shoulder      Mobility Bed Mobility               General bed mobility comments: bed level this session due to pain  Transfers                      Balance                                            ADL Overall ADL's : Needs assistance/impaired Eating/Feeding: Set up;Sitting   Grooming: Minimal assistance;Sitting   Upper Body Bathing: Minimal assistance;Sitting   Lower Body Bathing: Minimal assistance;Sit to/from stand   Upper Body Dressing : Minimal assistance;Sitting    Lower Body Dressing: Minimal assistance;Sit to/from stand                 General ADL Comments: Pt reports she has walked to the bathroom several times "just fine." Pain 8/10 at time of initial eval. bed level only. ADL, sling, precautions, and NWB status reviewed with pt with spouse and daughter present. Pt reports she had a smooth recovery after previous shoulder surgery last year.      Vision         Perception     Praxis      Pertinent Vitals/Pain Pain Assessment: 0-10 Pain Score: 8  Pain Descriptors / Indicators: Aching;Sore Pain Intervention(s): Limited activity within patient's tolerance;Monitored during session;Patient requesting pain meds-RN notified;Ice applied     Hand Dominance Right   Extremity/Trunk Assessment Upper Extremity Assessment Upper Extremity Assessment: LUE deficits/detail LUE Deficits / Details: s/p TOTAL SHOULDER ARTHROPLASTY (Left). WNL ROM L elbow, slow and some discomfort.           Communication Communication Communication: No difficulties   Cognition Arousal/Alertness: Awake/alert Behavior During Therapy: WFL for tasks assessed/performed Overall Cognitive Status: Within Functional Limits for tasks assessed                     General Comments       Exercises       Shoulder Instructions      Home Living Family/patient  expects to be discharged to:: Private residence Living Arrangements: Spouse/significant other;Children;Other relatives Available Help at Discharge: Family;Available 24 hours/day Type of Home: House Home Access: Stairs to enter [employer] of Steps: 4 Entrance Stairs-Rails: Right;Left Home Layout: Two level;Able to live on main level with bedroom/bathroom     Bathroom Shower/Tub: Tub/shower unit   Bathroom Toilet: Handicapped height     Home Equipment: Environmental consultant - 2 wheels;Cane - single point;Shower seat          Prior Functioning/Environment Level of Independence: Independent                  OT Problem List: Impaired balance (sitting and/or standing);Decreased knowledge of use of DME or AE;Decreased knowledge of precautions;Pain;Impaired UE functional use      OT Treatment/Interventions: Self-care/ADL training;Therapeutic exercise;DME and/or AE instruction;Therapeutic activities;Patient/family education;Balance training    OT Goals(Current goals can be found in the care plan section) Acute Rehab OT Goals Patient Stated Goal: home today. less pain. OT Goal Formulation: With patient Time For Goal Achievement: 11/30/16 Potential to Achieve Goals: Good ADL Goals Pt Will Perform Upper Body Dressing: with modified independence;sitting Pt Will Perform Lower Body Dressing: with modified independence;sit to/from stand Pt Will Transfer to Toilet: with supervision;ambulating Pt Will Perform Toileting - Clothing Manipulation and hygiene: with modified independence;sit to/from stand Pt Will Perform Tub/Shower Transfer: with supervision;ambulating;shower seat Pt/caregiver will Perform Home Exercise Program: With written HEP provided;Left upper extremity (e/w/h ROM only)  OT Frequency: Min 2X/week   Barriers to D/C:            Co-evaluation              End of Session Equipment Utilized During Treatment: Other (comment) (sling) Nurse Communication: Patient requests pain meds  Activity Tolerance: Patient limited by pain Patient left: in bed;with call bell/phone within reach;with family/visitor present;Other (comment) (PA present)  OT Visit Diagnosis: Pain Pain - Right/Left: Left Pain - part of body: Shoulder                ADL either performed or assessed with clinical judgement  Time: 4098-1191 OT Time Calculation (min): 19 min Charges:  OT General Charges $OT Visit: 1 Procedure OT Evaluation $OT Eval Low Complexity: 1 Procedure G-Codes:       Pearletha Furl OTR/L Pager: [phone]  11/23/2016, 9:31 AM

## 2016-11-23 NOTE — Discharge Summary (Signed)
Patient ID: Jade Walker MRN: [id number] DOB/AGE: [birth year] 71 y.o.  Admit date: 11/22/2016 Discharge date: 11/23/2016  Admission Diagnoses:  Active Problems:   S/P shoulder replacement, left   Discharge Diagnoses:  Same  Past Medical History:  Diagnosis Date  . Anxiety   . 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 Asthma   . CKD (chronic kidney disease), stage III   . Complication of anesthesia    blood loss after back surgery because hernia was scratched and "torn" during intubation (02/15/11 EGD showed GIB d/t esophageal ulcer)  . COPD (chronic obstructive pulmonary disease) (HCC)   . Environmental and seasonal allergies   . Full dentures   . GERD (gastroesophageal reflux disease)   . Headache    sinus  . History of hiatal hernia   . History of kidney stones   . History of stress test 2012   pt. reports it was resulted normal-HPR  . Hypertension   . Hypothyroidism   . Iron deficiency anemia    from Care Everywhere  . Osteoporosis   . Pneumonia   . Shortness of breath dyspnea    climbing steps  . Stress incontinence    from Care Everywhere    Surgeries: Procedure(s): TOTAL SHOULDER ARTHROPLASTY on 11/22/2016   Consultants:   Discharged Condition: Improved  Hospital Course: JAMISHA HOESCHEN is an 71 y.o. female who was admitted 11/22/2016 for operative treatment of<principal problem not specified>. Patient has severe unremitting pain that affects sleep, daily activities, and work/hobbies. After pre-op clearance the patient was taken to the operating room on 11/22/2016 and underwent  Procedure(s): TOTAL SHOULDER ARTHROPLASTY.    Patient was given perioperative antibiotics: Anti-infectives    Start     Dose/Rate Route Frequency Ordered Stop   11/22/16 1800  ceFAZolin (ANCEF) IVPB 1 g/50 mL premix     1 g 100 mL/hr over 30 Minutes Intravenous Every 8 hours 11/22/16 1615 11/23/16 0142   11/22/16 1130  ceFAZolin (ANCEF) IVPB 2g/100 mL  premix     2 g 200 mL/hr over 30 Minutes Intravenous To [facility] 11/21/16 1301 11/22/16 1225       Patient was given sequential compression devices, early ambulation, and chemoprophylaxis to prevent DVT.  Patient benefited maximally from hospital stay and there were no complications.    Recent vital signs: Patient Vitals for the past 24 hrs:  BP Temp Temp src Pulse Resp SpO2 Height Weight  11/23/16 0540 120/61 99 F (37.2 C) Oral 79 16 97 % - -  11/23/16 0209 (!) 116/53 99.3 F (37.4 C) Oral 84 16 98 % - -  11/22/16 2048 113/64 99.4 F (37.4 C) Oral 95 18 98 % - -  11/22/16 1602 112/68 98.7 F (37.1 C) Oral 78 20 99 % - -  11/22/16 1532 111/74 - - 77 17 100 % - -  11/22/16 1530 - - - 72 12 98 % - -  11/22/16 1528 - - - 70 17 99 % - -  11/22/16 1518 107/85 - - 70 15 100 % - -  11/22/16 1515 - - - 71 15 98 % - -  11/22/16 1513 - - - 75 15 96 % - -  11/22/16 1503 135/76 - - 73 17 100 % - -  11/22/16 1500 - - - 71 17 96 % - -  11/22/16 1448 139/70 - - 71 14 96 % - -  11/22/16 1434 (!) 132/93 - - 74  12 96 % - -  11/22/16 1429 - 97 F (36.1 C) - - - - - -  11/22/16 1003 - - - - - - 5\' 2"  (1.575 m) 83.5 kg (184 lb)  11/22/16 0944 132/71 98.8 F (37.1 C) Oral 77 20 98 % - 83.5 kg (184 lb)     Recent laboratory studies:  Recent Labs  11/23/16 0433  HGB 10.7*  HCT 33.0*  NA 132*  K 3.7  CL 99*  CO2 26  BUN 13  CREATININE 1.88*  GLUCOSE 101*  CALCIUM 8.5*     Discharge Medications:   Allergies as of 11/23/2016      Reactions   Codeine Rash, Other (See Comments)   SWEATING "HEART FLUTTERING"   Sulfamethoxazole-trimethoprim Rash      Medication List    TAKE these medications   albuterol (2.5 MG/3ML) 0.083% nebulizer solution Commonly known as:  PROVENTIL Take 2.5 mg by nebulization every 6 (six) hours as needed for wheezing or shortness of breath. i puff once a day as needed   albuterol 108 (90 Base) MCG/ACT inhaler Commonly known as:  PROVENTIL  HFA;VENTOLIN HFA Inhale 1-2 puffs into the lungs every 6 (six) hours as needed for wheezing or shortness of breath.   buPROPion 150 MG 12 hr tablet Commonly known as:  WELLBUTRIN SR Take 150 mg by mouth at bedtime.   cetirizine 10 MG tablet Commonly known as:  ZYRTEC Take 10 mg by mouth daily.   citalopram 10 MG tablet Commonly known as:  CELEXA Take 10 mg by mouth daily.   famotidine 40 MG tablet Commonly known as:  PEPCID Take 40 mg by mouth 2 (two) times daily.   ferrous sulfate 325 (65 FE) MG EC tablet Take 325 mg by mouth daily with breakfast.   levothyroxine 50 MCG tablet Commonly known as:  SYNTHROID, LEVOTHROID Take 50 mcg by mouth daily before breakfast.   losartan 50 MG tablet Commonly known as:  COZAAR Take 50 mg by mouth daily.   losartan-hydrochlorothiazide 50-12.5 MG tablet Commonly known as:  HYZAAR Take 1 tablet by mouth daily.   methocarbamol 500 MG tablet Commonly known as:  ROBAXIN Take 500 mg by mouth 4 (four) times daily as needed for muscle spasms.   oxyCODONE 5 MG immediate release tablet Commonly known as:  ROXICODONE Take 1-2 tablets (5-10 mg total) by mouth every 4 (four) hours as needed for severe pain.   oxyCODONE-acetaminophen 5-325 MG tablet Commonly known as:  ROXICET Take 1-2 tablets by mouth every 4 (four) hours as needed for severe pain.   SALONPAS ARTHRITIS PAIN RELIEF EX Apply 1 patch topically daily as needed (Back and sholders).   tiZANidine 4 MG tablet Commonly known as:  ZANAFLEX Take 1 tablet (4 mg total) by mouth every 6 (six) hours as needed for muscle spasms.   Vitamin D (Ergocalciferol) 50000 units Caps capsule Commonly known as:  DRISDOL Take 50,000 Units by mouth every Monday. On Monday.       Diagnostic Studies: Dg Shoulder Left Port  Result Date: 11/22/2016 CLINICAL DATA:  71 year old female status post left shoulder replacement. Initial encounter. EXAM: LEFT SHOULDER - 1 VIEW COMPARISON:  None. FINDINGS:  Portable AP view at 1507 hours. Proximal left humerus metal implant in place. Glenohumeral alignment appears satisfactory on this AP view. Postoperative changes to the surrounding soft tissues including subcutaneous gas. No unexpected osseous changes identified. IMPRESSION: Proximal left humerus arthroplasty with no adverse features identified. Electronically Signed   By:  Odessa FlemingH  Hall M.D.   On: 11/22/2016 15:18    Disposition: 01-Home or Self Care  Discharge Instructions    Call MD / Call 911    Complete by:  As directed    If you experience chest pain or shortness of breath, CALL 911 and be transported to the hospital emergency room.  If you develope a fever above 101 F, pus (white drainage) or increased drainage or redness at the wound, or calf pain, call your surgeon's office.   Constipation Prevention    Complete by:  As directed    Drink plenty of fluids.  Prune juice may be helpful.  You may use a stool softener, such as Colace (over the counter) 100 mg twice a day.  Use MiraLax (over the counter) for constipation as needed.   Diet - low sodium heart healthy    Complete by:  As directed    Increase activity slowly as tolerated    Complete by:  As directed       Follow-up Information    NORRIS,STEVEN R, MD. Call in 2 weeks.   Specialty:  Orthopedic Surgery Why:  [phone] Contact information: [address] [place] [address] [phone]            Signed: Dorothy SparkBISSELL, Jilene Spohr M. 11/23/2016, 9:25 AM

## 2016-11-23 NOTE — Progress Notes (Signed)
Subjective: 1 Day Post-Op Procedure(s) (LRB): TOTAL SHOULDER ARTHROPLASTY (Left) Patient reports pain as mild.  Reports pain at the shoulder. Just finished with therapy. Would like to go home today. Denies numbness, tingling, N/V, CP, SOB. No other c/o.  Objective: Vital signs in last 24 hours: Temp:  [97 F (36.1 C)-99.4 F (37.4 C)] 99 F (37.2 C) (03/17 0540) Pulse Rate:  [70-95] 79 (03/17 0540) Resp:  [12-20] 16 (03/17 0540) BP: (107-139)/(53-93) 120/61 (03/17 0540) SpO2:  [96 %-100 %] 97 % (03/17 0540) Weight:  [83.5 kg (184 lb)] 83.5 kg (184 lb) (03/16 1003)  Intake/Output from previous day: 03/16 0701 - 03/17 0700 In: 1525 [I.V.:1525] Out: 150 [Blood:150] Intake/Output this shift: Total I/O In: 240 [P.O.:240] Out: -    Recent Labs  11/23/16 0433  HGB 10.7*    Recent Labs  11/23/16 0433  HCT 33.0*    Recent Labs  11/23/16 0433  NA 132*  K 3.7  CL 99*  CO2 26  BUN 13  CREATININE 1.88*  GLUCOSE 101*  CALCIUM 8.5*   No results for input(s): LABPT, INR in the last 72 hours.  Neurologically intact ABD soft Neurovascular intact Sensation intact distally Intact pulses distally Dorsiflexion/Plantar flexion intact Incision: dressing C/D/I and no drainage No cellulitis present Compartment soft no sign of DVT  Assessment/Plan: 1 Day Post-Op Procedure(s) (LRB): TOTAL SHOULDER ARTHROPLASTY (Left) Advance diet Up with therapy D/C IV fluids  Dressing change prior to D/C D/C today Discussed D/C instructions and follow up  BISSELL, JACLYN M. 11/23/2016, 9:23 AM

## 2016-11-23 NOTE — Progress Notes (Signed)
Occupational Therapy Treatment Patient Details Name: Jade PullingBrenda H Walker MRN: [id number] DOB: October 02, 1945 Today's Date: 11/23/2016    History of present illness s/p TOTAL SHOULDER ARTHROPLASTY (Left)   OT comments  Pt completed in-room mobility and toilet transfer at supervision level. 3 reps L elbow flex/ext in supine, WFL. Premedicated this session. Pt ready for d/c from OT standpoint.   Follow Up Recommendations  No OT follow up;Supervision - Intermittent;Other (comment)    Equipment Recommendations  None recommended by OT    Recommendations for Other Services      Precautions / Restrictions Precautions Precautions: Shoulder Type of Shoulder Precautions: Conservative Shoulder Interventions: Shoulder sling/immobilizer;At all times;Off for dressing/bathing/exercises Precaution Booklet Issued: Yes (comment) Precaution Comments: reviewed Required Braces or Orthoses: Sling Restrictions Weight Bearing Restrictions: Yes LUE Weight Bearing: Non weight bearing Other Position/Activity Restrictions: No AROM or PROM of L shoulder       Mobility Bed Mobility Overal bed mobility: Modified Independent             General bed mobility comments: bed level this session due to pain  Transfers Overall transfer level: Needs assistance   Transfers: Sit to/from Stand Sit to Stand: Supervision         General transfer comment: from EOB and toilet    Balance Overall balance assessment: Needs assistance         Standing balance support: No upper extremity supported Standing balance-Leahy Scale: Good                     ADL Overall ADL's : Needs assistance/impaired Eating/Feeding: Set up;Sitting   Grooming: Minimal assistance;Sitting   Upper Body Bathing: Minimal assistance;Sitting   Lower Body Bathing: Minimal assistance;Sit to/from stand   Upper Body Dressing : Minimal assistance;Sitting   Lower Body Dressing: Minimal assistance;Sit to/from stand   Toilet  Transfer: Ambulation;Grab bars;Supervision/safety           Functional mobility during ADLs: Supervision/safety General ADL Comments: Pt completed in-room functional mobilty and toilet transfer at supervision level. Pt reported needing a little help with UB/LB dressing this morning.       Vision                     Perception     Praxis      Cognition   Behavior During Therapy: [facility] AssociatesWFL for tasks assessed/performed Overall Cognitive Status: Within Functional Limits for tasks assessed                         Exercises Other Exercises Other Exercises: 3 reps elbow flexion/ext WFL in supine.    Shoulder Instructions       General Comments      Pertinent Vitals/ Pain       Pain Assessment: Faces Pain Score: 8  Faces Pain Scale: Hurts even more Pain Location: R shoulder Pain Descriptors / Indicators: Aching;Sore Pain Intervention(s): Limited activity within patient's tolerance;Monitored during session;Premedicated before session;Repositioned;Ice applied  Home Living Family/patient expects to be discharged to:: Private residence Living Arrangements: Spouse/significant other;Children;Other relatives Available Help at Discharge: Family;Available 24 hours/day Type of Home: House Home Access: Stairs to enter [employer]trance Stairs-Number of Steps: 4 Entrance Stairs-Rails: Right;Left Home Layout: Two level;Able to live on main level with bedroom/bathroom     Bathroom Shower/Tub: Tub/shower unit   Bathroom Toilet: Handicapped height     Home Equipment: Environmental consultantWalker - 2 wheels;Cane - single point;Shower seat  Prior Functioning/Environment Level of Independence: Independent            Frequency  Min 2X/week        Progress Toward Goals  OT Goals(current goals can now be found in the care plan section)  Progress towards OT goals: Progressing toward goals  Acute Rehab OT Goals Patient Stated Goal: home today. less pain. OT Goal Formulation:  With patient Time For Goal Achievement: 11/30/16 Potential to Achieve Goals: Good ADL Goals Pt Will Perform Upper Body Dressing: with modified independence;sitting Pt Will Perform Lower Body Dressing: with modified independence;sit to/from stand Pt Will Transfer to Toilet: with supervision;ambulating Pt Will Perform Toileting - Clothing Manipulation and hygiene: with modified independence;sit to/from stand Pt Will Perform Tub/Shower Transfer: with supervision;ambulating;shower seat Pt/caregiver will Perform Home Exercise Program: With written HEP provided;Left upper extremity  Plan Discharge plan remains appropriate    Co-evaluation                 End of Session Equipment Utilized During Treatment: Other (comment) (sling)  OT Visit Diagnosis: Pain Pain - Right/Left: Left Pain - part of body: Shoulder   Activity Tolerance Patient limited by pain   Patient Left in bed;with call bell/phone within reach;with family/visitor present;Other (comment)   Nurse Communication Patient requests pain meds        Time: 1610-9604 OT Time Calculation (min): 8 min  Charges: OT General Charges $OT Visit: 1 Procedure OT Evaluation $OT Eval Low Complexity: 1 Procedure OT Treatments $Self Care/Home Management : 8-22 mins     Pilar Grammes 11/23/2016, 11:29 AM

## 2016-11-25 NOTE — Op Note (Signed)
NAME:  Jade Walker, Jade Walker                 ACCOUNT NO.:  MEDICAL RECORD NO.:  [id number]  LOCATION:                                 FACILITY:  PHYSICIAN:  Almedia Balls. Ranell Patrick, M.D.      DATE OF BIRTH:  DATE OF PROCEDURE:  11/22/2016 DATE OF DISCHARGE:                              OPERATIVE REPORT   PREOPERATIVE DIAGNOSIS:  Left shoulder end-stage osteoarthritis.  POSTOPERATIVE DIAGNOSIS:  Left shoulder end-stage osteoarthritis.  PROCEDURE PERFORMED:  Left anatomic total shoulder arthroplasty using the DePuy Global Unite System.  ATTENDING SURGEON:  Almedia Balls. Ranell Patrick, MD.  ASSISTANT:  Donnie Coffin. Dixon, PAC, who has scrubbed the entire procedure and necessary for satisfactory completion of surgery.  ANESTHESIA:  General anesthesia was used plus interscalene block.  ESTIMATED BLOOD LOSS:  150 mL.  FLUID REPLACEMENT:  1500 mL crystalloid.  INSTRUMENT COUNTS:  Correct.  COMPLICATIONS:  No complications.  ANTIBIOTICS:  Perioperative antibiotics were given.  INDICATIONS:  The patient is a 71 year old female with progressive left shoulder pain secondary to end-stage osteoarthritis.  The patient has had progressive pain despite conservative management.  She has had a prior right total shoulder arthroplasty and done well with that, desiring now left shoulder replacement to restore functional and eliminate pain to her shoulder.  Informed consent obtained.  DESCRIPTION OF PROCEDURE:  After an adequate level of anesthesia achieved, the patient was positioned in the modified beach-chair position.  Left shoulder was correctly identified, sterilely prepped and draped in usual manner.  Time-out was called.  We entered the shoulder using a standard deltopectoral incision started at the coracoid process extending down to the anterior humerus, dissection down through subcutaneous tissues using Bovie electrocautery.  The cephalic vein was identified, taken laterally with the deltoid, pectoralis  taken medially. Conjoint tendon identified and retracted medially, identified the biceps tendon within the sheath.  We went ahead and did an in situ tenodesis with 0 Vicryl figure-of-eight suture x2 into the pectoralis insertion, incorporating the tendons.  At this point, we released the subscapularis subperiosteally off the lesser tuberosity, placed a #2 FiberWire suture for repair at the end.  We did an inferior capsular release, placed our T-handled Crego elevator over the humeral head just underneath the rotator cuff insertion and we placed a reverse Hohmann and large Crego medially.  We externally rotated the shoulder approximately 30 degrees and made our anterior to posterior cut with the oscillating saw at the appropriate level.  We removed that bone in the head for bone grafting and kept that available bone.  We then removed the excess osteophytes with a rongeur.  We then extended the shoulder, placed our brown retractor, and released the biceps.  We were able to enter the proximal humerus with a 6 mm reamer, reamed up to size 12.  We then broached both with a 10 and then a 12 with the appropriate 25 degrees of retroversion. We removed our trial stem.  We reduced the shoulder and then subluxed it posteriorly, removing the biceps stump and then circumferentially the labrum and the capsule.  We were careful to protect the axillary nerve, which was easily palpable and we protected that during  the entire procedure.  Once we had full exposure, we placed our central guide pin, we reamed.  The cartilage was completely gone in the shoulder joint and eroded, almost looked like acid in the shoulder.  We were able to do very little reaming to get a good footprint base for the APG glenoid. We then did our peripheral hand reaming.  We then drilled our central peg hole and drilled our peripheral holes for a 40 APG glenoid.  We then took our trial glenoid and impacted that in position.  We were  pleased with the bony support.  We then irrigated, dried the peripheral holes with Gelfoam soaked with thrombin.  Once we had our DePuy high-viscosity cement mixed on the back table, we injected cement into the 3 peripheral holes and then impacted the APG glenoid holding in position until the cement hardened.  We checked our security of our glenoid and then went ahead and placed drill holes and #2 FiberWire suture in the lesser tuberosity for repair of the subscapularis.  We then used impaction grafting technique with available bone graft from the head, impacted the 12 press-fit stem with 12 body and 12 metaphysis placed in 25 degrees of retroversion, impacted that in position, good security, and then trialed the first 44 x 18 eccentric and we were happy with that and then we selected the real 44 x 18 eccentric head, impacting that with the centricity dialed superiorly and posteriorly.  We had excellent coverage.  We then reduced the shoulder, had good soft tissue balancing with the ability to translate inferiorly 50% and posteriorly 50%.  Once we had that completed, we repaired the subscapularis anatomically with the FiberWire suture including the rotator interval closure with a nice repair, excellent excursion with the shoulder.  We irrigated thoroughly and then closed deltopectoral incision with 0 Vicryl suture followed by 2-0 Vicryl for subcutaneous closure and 4-0 Monocryl for skin.  Sterile dressing applied followed by a shoulder sling.  The patient tolerated the surgery well.     Almedia BallsSteven R. Ranell PatrickNorris, M.D.     SRN/MEDQ  D:  11/22/2016  T:  11/22/2016  Job:  [id number]

## 2016-11-27 ENCOUNTER — Encounter (HOSPITAL_COMMUNITY): Payer: Self-pay | Admitting: Orthopedic Surgery

## 2017-04-02 ENCOUNTER — Other Ambulatory Visit (HOSPITAL_COMMUNITY): Payer: Self-pay | Admitting: General Surgery

## 2017-04-02 DIAGNOSIS — R1319 Other dysphagia: Secondary | ICD-10-CM

## 2017-04-07 ENCOUNTER — Ambulatory Visit (HOSPITAL_COMMUNITY)
Admission: RE | Admit: 2017-04-07 | Discharge: 2017-04-07 | Disposition: A | Discharge status: Home / Self Care | Payer: Medicare HMO | Source: Ambulatory Visit | Attending: General Surgery | Admitting: General Surgery

## 2017-04-07 DIAGNOSIS — J479 Bronchiectasis, uncomplicated: Secondary | ICD-10-CM | POA: Insufficient documentation

## 2017-04-07 DIAGNOSIS — K5730 Diverticulosis of large intestine without perforation or abscess without bleeding: Secondary | ICD-10-CM | POA: Diagnosis not present

## 2017-04-07 DIAGNOSIS — R1319 Other dysphagia: Secondary | ICD-10-CM | POA: Diagnosis present

## 2017-04-07 DIAGNOSIS — K449 Diaphragmatic hernia without obstruction or gangrene: Secondary | ICD-10-CM | POA: Insufficient documentation

## 2017-04-07 DIAGNOSIS — J984 Other disorders of lung: Secondary | ICD-10-CM | POA: Diagnosis not present

## 2017-05-07 ENCOUNTER — Ambulatory Visit (INDEPENDENT_AMBULATORY_CARE_PROVIDER_SITE_OTHER): Payer: Medicare HMO

## 2017-05-07 ENCOUNTER — Encounter (INDEPENDENT_AMBULATORY_CARE_PROVIDER_SITE_OTHER): Payer: Self-pay | Admitting: Orthopedic Surgery

## 2017-05-07 ENCOUNTER — Ambulatory Visit (INDEPENDENT_AMBULATORY_CARE_PROVIDER_SITE_OTHER): Payer: Medicare HMO | Admitting: Orthopedic Surgery

## 2017-05-07 DIAGNOSIS — G8929 Other chronic pain: Secondary | ICD-10-CM

## 2017-05-07 DIAGNOSIS — M1711 Unilateral primary osteoarthritis, right knee: Secondary | ICD-10-CM

## 2017-05-07 DIAGNOSIS — M545 Low back pain: Secondary | ICD-10-CM | POA: Diagnosis not present

## 2017-05-07 NOTE — Progress Notes (Signed)
Office Visit Note   Patient: Jade Walker           Date of Birth: 25-Jan-1946           MRN: [id number] Visit Date: 05/07/2017 Requested by: Shellia Cleverly, PA [address] [place], [place] [address] PCP: Shellia Cleverly, PA  Subjective: Chief Complaint  Patient presents with  . Lower Back - Pain  . Right Knee - Pain    HPI: Jade Walker is a 71 year old patient with right knee pain.  Been ongoing for years.  It is getting worse.  She describes swelling is less pain which wakes her from sleep at night and weakness giving way as well as decreased walking endurance because of the pain.  She's had very good result from her left knee replacement.  Since I said bilateral shoulder replacements which are doing well.  She also describes midline back pain with leg pain right worse than left superior.  Denies any numbness and tingling in regards to the back.  She retired at the beginning of the year.  Does have a history of an surgery done 12 years ago by Dr. Claybon Jabs.  She misses her work.  She has good family support including her husband at home.              ROS: All systems reviewed are negative as they relate to the chief complaint within the history of present illness.  Patient denies  fevers or chills.   Assessment & Plan: Visit Diagnoses:  1. Primary osteoarthritis of right knee   2. Chronic midline low back pain, with sciatica presence unspecified     Plan: Impression is right knee arthritis with successful left total knee replacement.  She's had multiple conservative treatments including injections which is given her some relief.  She has retired and wants to be more pain-free.  She also has back pain.  I think she likely has adjacent segment is with mild right-sided radiculopathy.  The MRI scan L-spine to evaluate adjacent segment disease with likely epidural steroid junctions to follow.  In regards to the right knee we discussed further operative and nonoperative options.  I think knee  replacement is an option for her.  Risks and benefits are discussed including not limited to infection or vessel damage knee stiffness as well as potential need for revision.  All questions answered.  Follow-Up Instructions: Return for after MRI.   Orders:  Orders Placed This Encounter  Procedures  . XR KNEE 3 VIEW RIGHT  . XR Lumbar Spine 2-3 Views  . MR Lumbar Spine w/o contrast   No orders of the defined types were placed in this encounter.     Procedures: No procedures performed   Clinical Data: No additional findings.  Objective: Vital Signs: There were no vitals taken for this visit.  Physical Exam:   Constitutional: Patient appears well-developed HEENT:  Head: Normocephalic Eyes:EOM are normal Neck: Normal range of motion Cardiovascular: Normal rate Pulmonary/chest: Effort normal Neurologic: Patient is alert Skin: Skin is warm Psychiatric: Patient has normal mood and affect    Ortho Exam: Orthopedic exam demonstrates full active and passive range of motion of the right knee with varus alignment and lacking about 7-8 of full extension.  Pedal pulses palpable.  Repeat exam shows lacking about 20 of full flexion.  There is medial lateral and patellofemoral tenderness.  Collateral and cruciate ligaments are stable.  Left knee moves very well with no effusion no pain and no  crepitus.  Patient has well-healed surgical incision but pain in a bandlike pattern below the incision in the lower lumbar spine.  Has pain more with extension than flexion.  5 out of 5 ankle dorsiflexion plantar flexion quite hamstring strength with palpable pedal pulses and no trochanteric tenderness is noted.  No definite paresthesias L1 S1 bilaterally.  Negative Babinski.  Negative clonus.  Specialty Comments:  No specialty comments available.  Imaging: Xr Knee 3 View Right  Result Date: 05/07/2017 AP lateral merchant right knee reviewed.  Medial and patellofemoral compartment arthritis is  present with bone-on-bone changes noted.  Varus alignment is present.  Trace effusion noted.  No other soft tissue calcifications  Xr Lumbar Spine 2-3 Views  Result Date: 05/07/2017 AP lateral lumbar spine reviewed.  Patient has 1 level mid lumbar and alignment.  Mild adjacent segment disease is noted.  No hardware complications seen.  Fusion appears complete at the level fused    PMFS History: Patient Active Problem List   Diagnosis Date Noted  . 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 S/P shoulder replacement 10/06/2015  . Degenerative arthritis of knee 04/18/2015   Past Medical History:  Diagnosis Date  . Anxiety   . 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 Asthma   . CKD (chronic kidney disease), stage III   . Complication of anesthesia    blood loss after back surgery because hernia was scratched and "torn" during intubation (02/15/11 EGD showed GIB d/t esophageal ulcer)  . COPD (chronic obstructive pulmonary disease) (HCC)   . Environmental and seasonal allergies   . Full dentures   . GERD (gastroesophageal reflux disease)   . Headache    sinus  . History of hiatal hernia   . History of kidney stones   . History of stress test 2012   pt. reports it was resulted normal-[facility]  . Hypertension   . Hypothyroidism   . Iron deficiency anemia    from Care Everywhere  . Osteoporosis   . Pneumonia   . Shortness of breath dyspnea    climbing steps  . Stress incontinence    from Care Everywhere    Family History  Problem Relation Age of Onset  . CAD Mother   . Emphysema Father   . Alcoholism Father     Past Surgical History:  Procedure Laterality Date  . ABDOMINAL HYSTERECTOMY    . BACK SURGERY     lumbar x 3, 1 thoracic spine  . CHOLECYSTECTOMY    . COLONOSCOPY    . EYE SURGERY Bilateral    cataract surgery with lens implant  . FRACTURE SURGERY    . HERNIA REPAIR     hiatal hernia "removed"- HPR, 1990  . JOINT REPLACEMENT Left  04/2105   knee  . OPEN REDUCTION INTERNAL FIXATION (ORIF) DISTAL RADIAL FRACTURE Left 03/14/2015   Procedure: OPEN REDUCTION INTERNAL FIXATION (ORIF) LEFT DISTAL RADIUS FRACTURE;  Surgeon: Cammy Copa, MD;  Location: [facility] OR;  Service: Orthopedics;  Laterality: Left;  . TONSILLECTOMY    . TOTAL KNEE ARTHROPLASTY Left 04/18/2015   Procedure: TOTAL KNEE ARTHROPLASTY;  Surgeon: Cammy Copa, MD;  Location: [facility] OR;  Service: Orthopedics;  Laterality: Left;  . TOTAL SHOULDER ARTHROPLASTY Right 10/06/2015   Procedure: RIGHT TOTAL SHOULDER ARTHROPLASTY;  Surgeon: Beverely Low, MD;  Location: [facility] OR;  Service: Orthopedics;  Laterality: Right;  . TOTAL SHOULDER ARTHROPLASTY Left 11/22/2016   Procedure: TOTAL SHOULDER ARTHROPLASTY;  Surgeon: Brett Canales  Ranell PatrickNorris, MD;  Location: [facility] OR;  Service: Orthopedics;  Laterality: Left;   Social History   Occupational History  . Not on file.   Social History Main Topics  . Smoking status: Never Smoker  . Smokeless tobacco: Never Used  . Alcohol use No  . Drug use: No  . Sexual activity: Not on file

## 2017-05-08 ENCOUNTER — Telehealth (INDEPENDENT_AMBULATORY_CARE_PROVIDER_SITE_OTHER): Payer: Self-pay | Admitting: Orthopedic Surgery

## 2017-05-08 NOTE — Telephone Encounter (Signed)
LVM with pt to please call to schedule surgery. Will call pt again at a later time.

## 2017-05-13 ENCOUNTER — Other Ambulatory Visit (INDEPENDENT_AMBULATORY_CARE_PROVIDER_SITE_OTHER): Payer: Self-pay | Admitting: Orthopedic Surgery

## 2017-05-13 ENCOUNTER — Encounter (INDEPENDENT_AMBULATORY_CARE_PROVIDER_SITE_OTHER): Payer: Self-pay | Admitting: Orthopedic Surgery

## 2017-05-13 DIAGNOSIS — M1711 Unilateral primary osteoarthritis, right knee: Secondary | ICD-10-CM

## 2017-05-17 ENCOUNTER — Ambulatory Visit
Admission: RE | Admit: 2017-05-17 | Discharge: 2017-05-17 | Disposition: A | Discharge status: Home / Self Care | Payer: Medicare HMO | Source: Ambulatory Visit | Attending: Orthopedic Surgery | Admitting: Orthopedic Surgery

## 2017-05-17 DIAGNOSIS — G8929 Other chronic pain: Secondary | ICD-10-CM

## 2017-05-17 DIAGNOSIS — M545 Low back pain: Principal | ICD-10-CM

## 2017-05-30 NOTE — Pre-Procedure Instructions (Signed)
LOMA DUBUQUE  05/30/2017      [id number] - [place], [place] - [address] [place] [address] Phone: [phone] Fax: [phone]    Your procedure is scheduled on Oct. 2  Report to [facility] Admitting at 1045 A.M.  Call this number if you have problems the morning of surgery:  [phone]   Remember:  Do not eat food or drink liquids after midnight.  Take these medicines the morning of surgery with A SIP OF WATER albuterol inhaler if needed, albuterol neb if needed, cetirizine (Zyrtec), Citalopram (Celexa), famotidine (Pepcid), levothyroxine (synthroid), Oxycodone if needed  Bring your inhalers with you on the day of surgery.  Stop taking aspirin, BC's, Goody's, Herbal medications, Fish Oil, Vitamins, Aleve, Ibuprofen, Advil, Motin    Do not wear jewelry, make-up or nail polish.  Do not wear lotions, powders, or perfumes, or deoderant.  Do not shave 48 hours prior to surgery.  Men may shave face and neck.  Do not bring valuables to the hospital.  [facility] is not responsible for any belongings or valuables.  Contacts, dentures or bridgework may not be worn into surgery.  Leave your suitcase in the car.  After surgery it may be brought to your room.  For patients admitted to the hospital, discharge time will be determined by your treatment team.  Patients discharged the day of surgery will not be allowed to drive home.    Special instructions:  [facility] - Preparing for Surgery  Before surgery, you can play an important role.  Because skin is not sterile, your skin needs to be as free of germs as possible.  You can reduce the number of germs on you skin by washing with CHG (chlorahexidine gluconate) soap before surgery.  CHG is an antiseptic cleaner which kills germs and bonds with the skin to continue killing germs even after washing.  Please DO NOT use if you have an allergy to CHG or  antibacterial soaps.  If your skin becomes reddened/irritated stop using the CHG and inform your nurse when you arrive at Short Stay.  Do not shave (including legs and underarms) for at least 48 hours prior to the first CHG shower.  You may shave your face.  Please follow these instructions carefully:   1.  Shower with CHG Soap the night before surgery and the  morning of Surgery.  2.  If you choose to wash your hair, wash your hair first as usual with your  normal shampoo.  3.  After you shampoo, rinse your hair and body thoroughly to remove the Shampoo.  4.  Use CHG as you would any other liquid soap.  You can apply chg directly  to the skin and wash gently with scrungie or a clean washcloth.  5.  Apply the CHG Soap to your body ONLY FROM THE NECK DOWN.  Do not use on open wounds or open sores.  Avoid contact with your eyes,       ears, mouth and genitals (private parts).  Wash genitals (private parts)  with your normal soap.  6.  Wash thoroughly, paying special attention to the area where your surgery will be performed.  7.  Thoroughly rinse your body with warm water from the neck down.  8.  DO NOT shower/wash with your normal soap after using and rinsing off  the CHG Soap.  9.  Pat yourself dry  with a clean towel.            10.  Wear clean pajamas.            11.  Place clean sheets on your bed the night of your first shower and do not sleep with pets.  Day of Surgery  Do not apply any lotions/deoderants the morning of surgery.  Please wear clean clothes to the hospital/surgery center.     Please read over the following fact sheets that you were given. Pain Booklet, Coughing and Deep Breathing, MRSA Information and Surgical Site Infection Prevention

## 2017-06-02 ENCOUNTER — Encounter (HOSPITAL_COMMUNITY): Payer: Self-pay

## 2017-06-02 ENCOUNTER — Encounter (HOSPITAL_COMMUNITY)
Admission: RE | Admit: 2017-06-02 | Discharge: 2017-06-02 | Disposition: A | Discharge status: Home / Self Care | Payer: Medicare HMO | Source: Ambulatory Visit | Attending: Orthopedic Surgery | Admitting: Orthopedic Surgery

## 2017-06-02 DIAGNOSIS — Z01812 Encounter for preprocedural laboratory examination: Secondary | ICD-10-CM | POA: Diagnosis present

## 2017-06-02 LAB — URINALYSIS, COMPLETE (UACMP) WITH MICROSCOPIC
Bilirubin Urine: NEGATIVE
GLUCOSE, UA: NEGATIVE mg/dL
KETONES UR: NEGATIVE mg/dL
LEUKOCYTES UA: NEGATIVE
NITRITE: NEGATIVE
PROTEIN: NEGATIVE mg/dL
Specific Gravity, Urine: 1.009 (ref 1.005–1.030)
pH: 6 (ref 5.0–8.0)

## 2017-06-02 LAB — BASIC METABOLIC PANEL
ANION GAP: 6 (ref 5–15)
BUN: 9 mg/dL (ref 6–20)
CHLORIDE: 104 mmol/L (ref 101–111)
CO2: 29 mmol/L (ref 22–32)
Calcium: 9.4 mg/dL (ref 8.9–10.3)
Creatinine, Ser: 2.03 mg/dL — ABNORMAL HIGH (ref 0.44–1.00)
GFR, EST AFRICAN AMERICAN: 27 mL/min — AB (ref >60)
GFR, EST NON AFRICAN AMERICAN: 24 mL/min — AB (ref >60)
GLUCOSE: 95 mg/dL (ref 65–99)
POTASSIUM: 4.3 mmol/L (ref 3.5–5.1)
SODIUM: 139 mmol/L (ref 135–145)

## 2017-06-02 LAB — SURGICAL PCR SCREEN
MRSA, PCR: POSITIVE — AB
STAPHYLOCOCCUS AUREUS: POSITIVE — AB

## 2017-06-02 LAB — CBC
HEMATOCRIT: 42.9 % (ref 36.0–46.0)
Hemoglobin: 13.5 g/dL (ref 12.0–15.0)
MCH: 29.5 pg (ref 26.0–34.0)
MCHC: 31.5 g/dL (ref 30.0–36.0)
MCV: 93.9 fL (ref 78.0–100.0)
Platelets: 278 10*3/uL (ref 150–400)
RBC: 4.57 MIL/uL (ref 3.87–5.11)
RDW: 12.6 % (ref 11.5–15.5)
WBC: 6.9 10*3/uL (ref 4.0–10.5)

## 2017-06-02 NOTE — Progress Notes (Signed)
Nasal PCR positive for MRSA, prescription called to pharmacy and message left for patient.

## 2017-06-02 NOTE — Progress Notes (Signed)
PCP - Daphane Shepherd Cardiologist - denies Nephrologist- Dr. Cammy Copa Pulonologist- Dr. Ysidro Evert  Chest x-ray - 01/15/17 EKG - 11/22/16 Stress Test - 2012 under media tab in epic ECHO - 08/15/16  Pt denies cardiac history  Patient denies shortness of breath, fever, cough and chest pain at PAT appointment   Patient verbalized understanding of instructions that were given to them at the PAT appointment. Patient was also instructed that they will need to review over the PAT instructions again at home before surgery.

## 2017-06-03 LAB — URINE CULTURE

## 2017-06-03 NOTE — Addendum Note (Signed)
Addendum  created 06/03/17 1315 by Jerold Coombe, PA-C   Sign clinical note

## 2017-06-03 NOTE — Progress Notes (Addendum)
Anesthesia Chart Review: Patient is a 71 year old female scheduled for right TKA on 06/10/17 by Dr. Cammy Copa.   History includes never smoker, CKD (stage III), GERD, anxiety, stress incontinence, iron deficiency anemia, hiatal hernia, hernia repair, HTN, asthma, hypothyroidism, nephrolithiasis, asthma, exertional dyspnea,  hysterectomy, tonsillectomy, cholecystectomy, decompressive laminectomy L4-3 with PLIF 02/12/11 complicated by post-operative hematemesis that evening (EGD showed esophageal ulcer), ORIF radial fracture 03/14/15, left TKA 04/18/15, right total shoulder 10/06/15, left total shoulder 11/22/16.  - PCP is Daphane Shepherd, PA-C ([facility]; see Care Everywhere). Last office visit 04/22/17. (To my understanding, [facility] transitioned to [facility] earlier this month.) - According to primary care note she is followed by nephrologist Dr. Casimer Leek with [facility]; however, when I reviewed her chart by in March 2018, she was seeing Dr. Kimber Relic with [facility]. Office reports he last saw her in January 2018. - Pulmonologist is Dr. Loraine Leriche Donor ([facility]; see Care Everywhere). Last 01/15/17.  Meds include albuterol, Zyrtec, Celexa, Pepid, ferrous sulfate, levothyroxine, losartan, Singulair, oxycodone, Zanaflex.   BP 131/71   Pulse (!) 59   Temp 36.9 C   Resp 20   Ht  (1.676 m)   Wt 175 lb 0.7 oz (79.4 kg)   SpO2 97%   BMI 28.25 kg/m   EKG 11/22/16: NSR, possible inferior infarct (age undetermined). No significant change since last tracing.  Echo 08/15/16 [facility]; Care Everywhere): Summary Ejection fraction is visually estimated at 55-60% Normal left ventricular systolic function Borderline left ventricular hypertrophy Trace tricuspid regurgitation. Trace mitral regurgitation.  Stress echo 10/30/10 [facility]; scanned under Media tab, Correspondence dated 02/17/2011): Poor exercise  tolerance. Low normal resting LV systolic function 55%. Hypertensive resting BP with normal response. LV function at peak exercise in not hyperdynamic but there are no focal wall motion abnormalities. Negative exercise treadmill test. Equivocal stress echo with no focal wall motion abnormality post exercise, but without hyperdynamic response.   Renal U/S 08/15/16 [facility]; Care Everywhere): Impression: Mildly atrophic kidneys bilaterally (right kidney 7.1 cm, left 9.0 cm). Normal echotexture. No hydronephrosis.   CXR 01/15/17 [facility]; Care Everywhere): FINDINGS: Heart size and vascularity normal. Lungs are clear without infiltrate effusion or mass. Hiatal hernia.Bilateral shoulder replacement. IMPRESSION: No active cardiopulmonary disease.  Preoperative labs reviewed.Urine culture showed multiple species present, suggest recollection. CBC WNL. Glucose 95. K 4.3. BUN 9, Cr 2.03. Comparison labs in Care Everywhere show Cr 1.92 01/10/17, 1.90, 03/20/17, and 2.06 04/22/17. Preoperative Cr for left shoulder surgery in March was 1.93. (In March, I actually called this result to Dr. Cammy Copa who had recommended keeping patient well hydrated in the perioperative period.). Overall, I her renal function appears stable since then.   If no acute changes then I anticipate that she can proceed as planned.  Velna Ochs [facility] Short Stay Center/Anesthesiology Phone [phone] 06/03/2017 2:01 PM

## 2017-06-10 ENCOUNTER — Inpatient Hospital Stay (HOSPITAL_COMMUNITY): Payer: Medicare HMO | Admitting: Emergency Medicine

## 2017-06-10 ENCOUNTER — Inpatient Hospital Stay (HOSPITAL_COMMUNITY): Payer: Medicare HMO | Admitting: Certified Registered Nurse Anesthetist

## 2017-06-10 ENCOUNTER — Encounter (HOSPITAL_COMMUNITY)
Admission: RE | Disposition: A | Discharge status: Home Health | Payer: Self-pay | Source: Ambulatory Visit | Attending: Orthopedic Surgery

## 2017-06-10 ENCOUNTER — Inpatient Hospital Stay (HOSPITAL_COMMUNITY)
Admission: RE | Admit: 2017-06-10 | Discharge: 2017-06-12 | DRG: 470 | Disposition: A | Discharge status: Home Health | Payer: Medicare HMO | Source: Ambulatory Visit | Attending: Orthopedic Surgery | Admitting: Orthopedic Surgery

## 2017-06-10 ENCOUNTER — Encounter (HOSPITAL_COMMUNITY): Payer: Self-pay | Admitting: General Practice

## 2017-06-10 DIAGNOSIS — Z9049 Acquired absence of other specified parts of digestive tract: Secondary | ICD-10-CM | POA: Diagnosis not present

## 2017-06-10 DIAGNOSIS — M1710 Unilateral primary osteoarthritis, unspecified knee: Secondary | ICD-10-CM | POA: Diagnosis present

## 2017-06-10 DIAGNOSIS — F419 Anxiety disorder, unspecified: Secondary | ICD-10-CM | POA: Diagnosis present

## 2017-06-10 DIAGNOSIS — J302 Other seasonal allergic rhinitis: Secondary | ICD-10-CM | POA: Diagnosis present

## 2017-06-10 DIAGNOSIS — Z87442 Personal history of urinary calculi: Secondary | ICD-10-CM | POA: Diagnosis not present

## 2017-06-10 DIAGNOSIS — J449 Chronic obstructive pulmonary disease, unspecified: Secondary | ICD-10-CM | POA: Diagnosis present

## 2017-06-10 DIAGNOSIS — Z96611 Presence of right artificial shoulder joint: Secondary | ICD-10-CM | POA: Diagnosis present

## 2017-06-10 DIAGNOSIS — Z22322 Carrier or suspected carrier of Methicillin resistant Staphylococcus aureus: Secondary | ICD-10-CM

## 2017-06-10 DIAGNOSIS — M25561 Pain in right knee: Secondary | ICD-10-CM | POA: Diagnosis present

## 2017-06-10 DIAGNOSIS — I129 Hypertensive chronic kidney disease with stage 1 through stage 4 chronic kidney disease, or unspecified chronic kidney disease: Secondary | ICD-10-CM | POA: Diagnosis present

## 2017-06-10 DIAGNOSIS — K219 Gastro-esophageal reflux disease without esophagitis: Secondary | ICD-10-CM | POA: Diagnosis present

## 2017-06-10 DIAGNOSIS — M1711 Unilateral primary osteoarthritis, right knee: Principal | ICD-10-CM | POA: Diagnosis present

## 2017-06-10 DIAGNOSIS — Z90710 Acquired absence of both cervix and uterus: Secondary | ICD-10-CM

## 2017-06-10 DIAGNOSIS — N183 Chronic kidney disease, stage 3 (moderate): Secondary | ICD-10-CM | POA: Diagnosis present

## 2017-06-10 DIAGNOSIS — Z8249 Family history of ischemic heart disease and other diseases of the circulatory system: Secondary | ICD-10-CM | POA: Diagnosis not present

## 2017-06-10 DIAGNOSIS — E039 Hypothyroidism, unspecified: Secondary | ICD-10-CM | POA: Diagnosis present

## 2017-06-10 DIAGNOSIS — K449 Diaphragmatic hernia without obstruction or gangrene: Secondary | ICD-10-CM | POA: Diagnosis present

## 2017-06-10 DIAGNOSIS — Z79899 Other long term (current) drug therapy: Secondary | ICD-10-CM

## 2017-06-10 DIAGNOSIS — Z972 Presence of dental prosthetic device (complete) (partial): Secondary | ICD-10-CM | POA: Diagnosis not present

## 2017-06-10 DIAGNOSIS — M810 Age-related osteoporosis without current pathological fracture: Secondary | ICD-10-CM | POA: Diagnosis present

## 2017-06-10 DIAGNOSIS — Z8614 Personal history of Methicillin resistant Staphylococcus aureus infection: Secondary | ICD-10-CM | POA: Diagnosis not present

## 2017-06-10 DIAGNOSIS — Z96612 Presence of left artificial shoulder joint: Secondary | ICD-10-CM | POA: Diagnosis present

## 2017-06-10 DIAGNOSIS — Z961 Presence of intraocular lens: Secondary | ICD-10-CM | POA: Diagnosis present

## 2017-06-10 HISTORY — PX: TOTAL KNEE ARTHROPLASTY: SHX125

## 2017-06-10 SURGERY — ARTHROPLASTY, KNEE, TOTAL
Anesthesia: Monitor Anesthesia Care | Site: Knee | Laterality: Right

## 2017-06-10 MED ORDER — ROCURONIUM BROMIDE 10 MG/ML (PF) SYRINGE
PREFILLED_SYRINGE | INTRAVENOUS | Status: AC
  Filled 2017-06-10: qty 5

## 2017-06-10 MED ORDER — FENTANYL CITRATE (PF) 100 MCG/2ML IJ SOLN
50.0000 ug | Freq: Once | INTRAMUSCULAR | Status: AC
  Administered 2017-06-10: 50 ug via INTRAVENOUS

## 2017-06-10 MED ORDER — OXYCODONE HCL 5 MG PO TABS: 5.0000 mg | ORAL_TABLET | Freq: Once | ORAL | Status: DC | PRN

## 2017-06-10 MED ORDER — METOCLOPRAMIDE HCL 5 MG PO TABS: 5.0000 mg | ORAL_TABLET | Freq: Three times a day (TID) | ORAL | Status: DC | PRN

## 2017-06-10 MED ORDER — DEXTROSE 5 % IV SOLN
INTRAVENOUS | Status: DC | PRN
  Administered 2017-06-10: 30 ug/min via INTRAVENOUS

## 2017-06-10 MED ORDER — MORPHINE SULFATE (PF) 4 MG/ML IV SOLN
4.0000 mg | INTRAVENOUS | Status: DC | PRN
  Administered 2017-06-11 (×2): 4 mg via INTRAVENOUS
  Filled 2017-06-10 (×2): qty 1

## 2017-06-10 MED ORDER — HYDROMORPHONE HCL 1 MG/ML IJ SOLN: 0.2500 mg | INTRAMUSCULAR | Status: DC | PRN

## 2017-06-10 MED ORDER — MORPHINE SULFATE (PF) 4 MG/ML IV SOLN
INTRAVENOUS | Status: DC | PRN
  Administered 2017-06-10: 8 mg

## 2017-06-10 MED ORDER — EPHEDRINE SULFATE-NACL 50-0.9 MG/10ML-% IV SOSY
PREFILLED_SYRINGE | INTRAVENOUS | Status: DC | PRN
  Administered 2017-06-10: 5 mg via INTRAVENOUS

## 2017-06-10 MED ORDER — BUPIVACAINE HCL (PF) 0.5 % IJ SOLN
INTRAMUSCULAR | Status: AC
  Filled 2017-06-10: qty 30

## 2017-06-10 MED ORDER — TRANEXAMIC ACID 1000 MG/10ML IV SOLN
2000.0000 mg | INTRAVENOUS | Status: AC
  Administered 2017-06-10: 2000 mg via TOPICAL
  Filled 2017-06-10: qty 20

## 2017-06-10 MED ORDER — 0.9 % SODIUM CHLORIDE (POUR BTL) OPTIME
TOPICAL | Status: DC | PRN
Start: 2017-06-10 — End: 2017-06-10
  Administered 2017-06-10: 1000 mL

## 2017-06-10 MED ORDER — OXYCODONE HCL 5 MG/5ML PO SOLN: 5.0000 mg | Freq: Once | ORAL | Status: DC | PRN

## 2017-06-10 MED ORDER — ASPIRIN EC 325 MG PO TBEC
325.0000 mg | DELAYED_RELEASE_TABLET | Freq: Every day | ORAL | Status: DC
  Administered 2017-06-11 – 2017-06-12 (×2): 325 mg via ORAL
  Filled 2017-06-10 (×2): qty 1

## 2017-06-10 MED ORDER — CITALOPRAM HYDROBROMIDE 10 MG PO TABS
10.0000 mg | ORAL_TABLET | Freq: Every day | ORAL | Status: DC
  Administered 2017-06-11 – 2017-06-12 (×2): 10 mg via ORAL
  Filled 2017-06-10 (×2): qty 1

## 2017-06-10 MED ORDER — MONTELUKAST SODIUM 10 MG PO TABS
10.0000 mg | ORAL_TABLET | Freq: Every day | ORAL | Status: DC
  Administered 2017-06-10 – 2017-06-11 (×2): 10 mg via ORAL
  Filled 2017-06-10 (×2): qty 1

## 2017-06-10 MED ORDER — VANCOMYCIN HCL IN DEXTROSE 1-5 GM/200ML-% IV SOLN
INTRAVENOUS | Status: AC
  Administered 2017-06-10: 1000 mg via INTRAVENOUS
  Filled 2017-06-10: qty 200

## 2017-06-10 MED ORDER — ALBUTEROL SULFATE HFA 108 (90 BASE) MCG/ACT IN AERS: 1.0000 | INHALATION_SPRAY | Freq: Four times a day (QID) | RESPIRATORY_TRACT | Status: DC | PRN

## 2017-06-10 MED ORDER — ONDANSETRON HCL 4 MG/2ML IJ SOLN
4.0000 mg | Freq: Four times a day (QID) | INTRAMUSCULAR | Status: DC | PRN
  Administered 2017-06-11: 4 mg via INTRAVENOUS
  Filled 2017-06-10: qty 2

## 2017-06-10 MED ORDER — CEFAZOLIN SODIUM-DEXTROSE 2-4 GM/100ML-% IV SOLN: 2.0000 g | INTRAVENOUS | Status: DC

## 2017-06-10 MED ORDER — DOCUSATE SODIUM 100 MG PO CAPS
100.0000 mg | ORAL_CAPSULE | Freq: Two times a day (BID) | ORAL | Status: DC
  Administered 2017-06-10 – 2017-06-12 (×4): 100 mg via ORAL
  Filled 2017-06-10 (×4): qty 1

## 2017-06-10 MED ORDER — POTASSIUM CHLORIDE IN NACL 20-0.9 MEQ/L-% IV SOLN
INTRAVENOUS | Status: AC
Start: 2017-06-10 — End: 2017-06-11
  Administered 2017-06-10: 19:00:00 via INTRAVENOUS
  Filled 2017-06-10 (×2): qty 1000

## 2017-06-10 MED ORDER — ONDANSETRON HCL 4 MG PO TABS
4.0000 mg | ORAL_TABLET | Freq: Four times a day (QID) | ORAL | Status: DC | PRN
  Administered 2017-06-10: 4 mg via ORAL
  Filled 2017-06-10: qty 1

## 2017-06-10 MED ORDER — FENTANYL CITRATE (PF) 250 MCG/5ML IJ SOLN
INTRAMUSCULAR | Status: AC
  Filled 2017-06-10: qty 5

## 2017-06-10 MED ORDER — SODIUM CHLORIDE 0.9% FLUSH
INTRAVENOUS | Status: DC | PRN
  Administered 2017-06-10: 20 mL

## 2017-06-10 MED ORDER — MIDAZOLAM HCL 2 MG/2ML IJ SOLN
INTRAMUSCULAR | Status: AC
  Filled 2017-06-10: qty 2

## 2017-06-10 MED ORDER — BUPIVACAINE HCL (PF) 0.75 % IJ SOLN
INTRAMUSCULAR | Status: DC | PRN
  Administered 2017-06-10: 2 mL via INTRATHECAL

## 2017-06-10 MED ORDER — SODIUM CHLORIDE 0.9 % IR SOLN
Status: DC | PRN
  Administered 2017-06-10: 3000 mL

## 2017-06-10 MED ORDER — PROMETHAZINE HCL 25 MG/ML IJ SOLN: 6.2500 mg | INTRAMUSCULAR | Status: DC | PRN

## 2017-06-10 MED ORDER — TIZANIDINE HCL 4 MG PO TABS
4.0000 mg | ORAL_TABLET | Freq: Three times a day (TID) | ORAL | Status: DC | PRN
  Administered 2017-06-11 – 2017-06-12 (×3): 4 mg via ORAL
  Filled 2017-06-10 (×3): qty 1

## 2017-06-10 MED ORDER — MORPHINE SULFATE (PF) 4 MG/ML IV SOLN
INTRAVENOUS | Status: AC
  Filled 2017-06-10: qty 2

## 2017-06-10 MED ORDER — BUPIVACAINE LIPOSOME 1.3 % IJ SUSP
20.0000 mL | INTRAMUSCULAR | Status: AC
  Administered 2017-06-10: 20 mL
  Filled 2017-06-10: qty 20

## 2017-06-10 MED ORDER — ACETAMINOPHEN 650 MG RE SUPP: 650.0000 mg | Freq: Four times a day (QID) | RECTAL | Status: DC | PRN

## 2017-06-10 MED ORDER — CHLORHEXIDINE GLUCONATE 4 % EX LIQD: 60.0000 mL | Freq: Once | CUTANEOUS | Status: DC

## 2017-06-10 MED ORDER — SODIUM CHLORIDE 0.9 % IV SOLN
INTRAVENOUS | Status: DC
  Administered 2017-06-10: 12:00:00 via INTRAVENOUS

## 2017-06-10 MED ORDER — PROPOFOL 500 MG/50ML IV EMUL
INTRAVENOUS | Status: DC | PRN
  Administered 2017-06-10: 50 ug/kg/min via INTRAVENOUS

## 2017-06-10 MED ORDER — LEVOTHYROXINE SODIUM 50 MCG PO TABS
50.0000 ug | ORAL_TABLET | Freq: Every day | ORAL | Status: DC
  Administered 2017-06-11 – 2017-06-12 (×2): 50 ug via ORAL
  Filled 2017-06-10 (×2): qty 1

## 2017-06-10 MED ORDER — TRANEXAMIC ACID 1000 MG/10ML IV SOLN
1000.0000 mg | INTRAVENOUS | Status: AC
  Administered 2017-06-10: 1000 mg via INTRAVENOUS
  Filled 2017-06-10: qty 10

## 2017-06-10 MED ORDER — PHENOL 1.4 % MT LIQD: 1.0000 | OROMUCOSAL | Status: DC | PRN

## 2017-06-10 MED ORDER — ONDANSETRON HCL 4 MG/2ML IJ SOLN
INTRAMUSCULAR | Status: DC | PRN
  Administered 2017-06-10: 4 mg via INTRAVENOUS

## 2017-06-10 MED ORDER — DEXAMETHASONE SODIUM PHOSPHATE 10 MG/ML IJ SOLN
INTRAMUSCULAR | Status: AC
  Filled 2017-06-10: qty 1

## 2017-06-10 MED ORDER — VANCOMYCIN HCL IN DEXTROSE 1-5 GM/200ML-% IV SOLN
1000.0000 mg | Freq: Once | INTRAVENOUS | Status: AC
  Administered 2017-06-10 (×2): 1000 mg via INTRAVENOUS

## 2017-06-10 MED ORDER — FENTANYL CITRATE (PF) 100 MCG/2ML IJ SOLN
INTRAMUSCULAR | Status: AC
  Filled 2017-06-10: qty 2

## 2017-06-10 MED ORDER — ALBUTEROL SULFATE (2.5 MG/3ML) 0.083% IN NEBU: 2.5000 mg | INHALATION_SOLUTION | Freq: Four times a day (QID) | RESPIRATORY_TRACT | Status: DC | PRN

## 2017-06-10 MED ORDER — ROPIVACAINE HCL 5 MG/ML IJ SOLN
INTRAMUSCULAR | Status: DC | PRN
  Administered 2017-06-10: 20 mL via PERINEURAL

## 2017-06-10 MED ORDER — CEFAZOLIN SODIUM-DEXTROSE 2-4 GM/100ML-% IV SOLN
INTRAVENOUS | Status: AC
  Filled 2017-06-10: qty 100

## 2017-06-10 MED ORDER — LIDOCAINE 2% (20 MG/ML) 5 ML SYRINGE
INTRAMUSCULAR | Status: AC
  Filled 2017-06-10: qty 5

## 2017-06-10 MED ORDER — METOCLOPRAMIDE HCL 5 MG/ML IJ SOLN: 5.0000 mg | Freq: Three times a day (TID) | INTRAMUSCULAR | Status: DC | PRN

## 2017-06-10 MED ORDER — FERROUS SULFATE 325 (65 FE) MG PO TBEC
325.0000 mg | DELAYED_RELEASE_TABLET | Freq: Every day | ORAL | Status: DC
  Administered 2017-06-11 – 2017-06-12 (×2): 325 mg via ORAL
  Filled 2017-06-10 (×3): qty 1

## 2017-06-10 MED ORDER — ALBUTEROL SULFATE (2.5 MG/3ML) 0.083% IN NEBU
2.5000 mg | INHALATION_SOLUTION | Freq: Four times a day (QID) | RESPIRATORY_TRACT | Status: DC | PRN
Start: 2017-06-10 — End: 2017-06-10

## 2017-06-10 MED ORDER — FAMOTIDINE 20 MG PO TABS
40.0000 mg | ORAL_TABLET | Freq: Every day | ORAL | Status: DC
  Administered 2017-06-10 – 2017-06-11 (×2): 40 mg via ORAL
  Filled 2017-06-10 (×2): qty 2

## 2017-06-10 MED ORDER — LORATADINE 10 MG PO TABS
10.0000 mg | ORAL_TABLET | Freq: Every day | ORAL | Status: DC
  Administered 2017-06-11 – 2017-06-12 (×2): 10 mg via ORAL
  Filled 2017-06-10 (×2): qty 1

## 2017-06-10 MED ORDER — LOSARTAN POTASSIUM 50 MG PO TABS
50.0000 mg | ORAL_TABLET | Freq: Every day | ORAL | Status: DC
Start: 2017-06-11 — End: 2017-06-12
  Filled 2017-06-10: qty 1

## 2017-06-10 MED ORDER — OXYCODONE HCL 5 MG PO TABS
5.0000 mg | ORAL_TABLET | ORAL | Status: DC | PRN
  Administered 2017-06-10 – 2017-06-12 (×10): 10 mg via ORAL
  Filled 2017-06-10 (×10): qty 2

## 2017-06-10 MED ORDER — MENTHOL 3 MG MT LOZG: 1.0000 | LOZENGE | OROMUCOSAL | Status: DC | PRN

## 2017-06-10 MED ORDER — VANCOMYCIN HCL IN DEXTROSE 1-5 GM/200ML-% IV SOLN
1000.0000 mg | Freq: Two times a day (BID) | INTRAVENOUS | Status: AC
  Administered 2017-06-10: 1000 mg via INTRAVENOUS
  Filled 2017-06-10: qty 200

## 2017-06-10 MED ORDER — ONDANSETRON HCL 4 MG/2ML IJ SOLN
INTRAMUSCULAR | Status: AC
  Filled 2017-06-10: qty 2

## 2017-06-10 MED ORDER — CLONIDINE HCL (ANALGESIA) 100 MCG/ML EP SOLN
150.0000 ug | Freq: Once | EPIDURAL | Status: DC
  Filled 2017-06-10: qty 1.5

## 2017-06-10 MED ORDER — MIDAZOLAM HCL 2 MG/2ML IJ SOLN
1.0000 mg | Freq: Once | INTRAMUSCULAR | Status: AC
  Administered 2017-06-10: 1 mg via INTRAVENOUS

## 2017-06-10 MED ORDER — CLONIDINE HCL (ANALGESIA) 100 MCG/ML EP SOLN
EPIDURAL | Status: DC | PRN
  Administered 2017-06-10: 1 mL via INTRA_ARTICULAR

## 2017-06-10 MED ORDER — ACETAMINOPHEN 325 MG PO TABS
650.0000 mg | ORAL_TABLET | Freq: Four times a day (QID) | ORAL | Status: DC | PRN
  Administered 2017-06-11 – 2017-06-12 (×2): 650 mg via ORAL
  Filled 2017-06-10 (×2): qty 2

## 2017-06-10 MED ORDER — PROPOFOL 10 MG/ML IV BOLUS
INTRAVENOUS | Status: AC
  Filled 2017-06-10: qty 20

## 2017-06-10 MED ORDER — INFLUENZA VAC SPLIT HIGH-DOSE 0.5 ML IM SUSY
0.5000 mL | PREFILLED_SYRINGE | INTRAMUSCULAR | Status: DC
  Filled 2017-06-10: qty 0.5

## 2017-06-10 SURGICAL SUPPLY — 76 items
BANDAGE ACE 4X5 VEL STRL LF (GAUZE/BANDAGES/DRESSINGS) ×3 IMPLANT
BANDAGE ACE 6X5 VEL STRL LF (GAUZE/BANDAGES/DRESSINGS) ×3 IMPLANT
BANDAGE ESMARK 6X9 LF (GAUZE/BANDAGES/DRESSINGS) ×1 IMPLANT
BLADE SAG 18X100X1.27 (BLADE) ×3 IMPLANT
BLADE SAW SGTL 13.0X1.19X90.0M (BLADE) ×3 IMPLANT
BNDG COHESIVE 6X5 TAN STRL LF (GAUZE/BANDAGES/DRESSINGS) ×3 IMPLANT
BNDG ELASTIC 6X15 VLCR STRL LF (GAUZE/BANDAGES/DRESSINGS) ×9 IMPLANT
BNDG ESMARK 6X9 LF (GAUZE/BANDAGES/DRESSINGS) ×3
BOWL SMART MIX CTS (DISPOSABLE) ×3 IMPLANT
CAPT KNEE TOTAL 3 ×3 IMPLANT
CEMENT BONE SIMPLEX SPEEDSET (Cement) ×3 IMPLANT
CLOSURE STERI-STRIP 1/2X4 (GAUZE/BANDAGES/DRESSINGS) ×1
CLOSURE WOUND 1/2 X4 (GAUZE/BANDAGES/DRESSINGS) ×2
CLSR STERI-STRIP ANTIMIC 1/2X4 (GAUZE/BANDAGES/DRESSINGS) ×2 IMPLANT
CONT SPECI 4OZ STER CLIK (MISCELLANEOUS) ×3 IMPLANT
COVER SURGICAL LIGHT HANDLE (MISCELLANEOUS) ×3 IMPLANT
CUFF TOURNIQUET SINGLE 34IN LL (TOURNIQUET CUFF) ×3 IMPLANT
CUFF TOURNIQUET SINGLE 44IN (TOURNIQUET CUFF) IMPLANT
DECANTER SPIKE VIAL GLASS SM (MISCELLANEOUS) ×3 IMPLANT
DRAPE INCISE IOBAN 66X45 STRL (DRAPES) ×3 IMPLANT
DRAPE ORTHO SPLIT 77X108 STRL (DRAPES) ×6
DRAPE SURG ORHT 6 SPLT 77X108 (DRAPES) ×3 IMPLANT
DRAPE U-SHAPE 47X51 STRL (DRAPES) ×3 IMPLANT
DRSG AQUACEL AG ADV 3.5X10 (GAUZE/BANDAGES/DRESSINGS) ×3 IMPLANT
DRSG AQUACEL AG ADV 3.5X14 (GAUZE/BANDAGES/DRESSINGS) IMPLANT
DRSG PAD ABDOMINAL 8X10 ST (GAUZE/BANDAGES/DRESSINGS) ×6 IMPLANT
DURAPREP 26ML APPLICATOR (WOUND CARE) ×6 IMPLANT
ELECT CAUTERY BLADE 6.4 (BLADE) ×3 IMPLANT
ELECT REM PT RETURN 9FT ADLT (ELECTROSURGICAL) ×3
ELECTRODE REM PT RTRN 9FT ADLT (ELECTROSURGICAL) ×1 IMPLANT
GAUZE SPONGE 4X4 12PLY STRL (GAUZE/BANDAGES/DRESSINGS) ×3 IMPLANT
GLOVE BIOGEL PI IND STRL 7.5 (GLOVE) ×1 IMPLANT
GLOVE BIOGEL PI IND STRL 8 (GLOVE) ×1 IMPLANT
GLOVE BIOGEL PI INDICATOR 7.5 (GLOVE) ×2
GLOVE BIOGEL PI INDICATOR 8 (GLOVE) ×2
GLOVE ECLIPSE 7.0 STRL STRAW (GLOVE) ×3 IMPLANT
GLOVE SURG ORTHO 8.0 STRL STRW (GLOVE) ×3 IMPLANT
GOWN STRL REUS W/ TWL LRG LVL3 (GOWN DISPOSABLE) ×3 IMPLANT
GOWN STRL REUS W/TWL LRG LVL3 (GOWN DISPOSABLE) ×6
HANDPIECE INTERPULSE COAX TIP (DISPOSABLE) ×2
HOOD PEEL AWAY FLYTE STAYCOOL (MISCELLANEOUS) ×9 IMPLANT
IMMOBILIZER KNEE 20 (SOFTGOODS) IMPLANT
IMMOBILIZER KNEE 22 UNIV (SOFTGOODS) ×3 IMPLANT
IMMOBILIZER KNEE 24 THIGH 36 (MISCELLANEOUS) IMPLANT
IMMOBILIZER KNEE 24 UNIV (MISCELLANEOUS)
KIT BASIN OR (CUSTOM PROCEDURE TRAY) ×3 IMPLANT
KIT ROOM TURNOVER OR (KITS) ×3 IMPLANT
MANIFOLD NEPTUNE II (INSTRUMENTS) ×3 IMPLANT
NDL SAFETY ECLIPSE 18X1.5 (NEEDLE) ×1 IMPLANT
NEEDLE HYPO 18GX1.5 SHARP (NEEDLE) ×2
NEEDLE HYPO 22GX1.5 SAFETY (NEEDLE) ×3 IMPLANT
NEEDLE SPNL 18GX3.5 QUINCKE PK (NEEDLE) ×3 IMPLANT
NS IRRIG 1000ML POUR BTL (IV SOLUTION) ×6 IMPLANT
PACK TOTAL JOINT (CUSTOM PROCEDURE TRAY) ×3 IMPLANT
PAD ARMBOARD 7.5X6 YLW CONV (MISCELLANEOUS) ×6 IMPLANT
PAD CAST 4YDX4 CTTN HI CHSV (CAST SUPPLIES) ×1 IMPLANT
PADDING CAST COTTON 4X4 STRL (CAST SUPPLIES) ×2
PADDING CAST COTTON 6X4 STRL (CAST SUPPLIES) ×3 IMPLANT
SET HNDPC FAN SPRY TIP SCT (DISPOSABLE) ×1 IMPLANT
SPONGE LAP 18X18 X RAY DECT (DISPOSABLE) IMPLANT
STRIP CLOSURE SKIN 1/2X4 (GAUZE/BANDAGES/DRESSINGS) ×4 IMPLANT
SUCTION FRAZIER HANDLE 10FR (MISCELLANEOUS) ×2
SUCTION TUBE FRAZIER 10FR DISP (MISCELLANEOUS) ×1 IMPLANT
SUT MNCRL AB 3-0 PS2 18 (SUTURE) ×3 IMPLANT
SUT VIC AB 0 CT1 27 (SUTURE) ×6
SUT VIC AB 0 CT1 27XBRD ANBCTR (SUTURE) ×3 IMPLANT
SUT VIC AB 1 CT1 27 (SUTURE) ×10
SUT VIC AB 1 CT1 27XBRD ANBCTR (SUTURE) ×5 IMPLANT
SUT VIC AB 2-0 CT1 27 (SUTURE) ×8
SUT VIC AB 2-0 CT1 TAPERPNT 27 (SUTURE) ×4 IMPLANT
SYR 30ML LL (SYRINGE) ×9 IMPLANT
SYR TB 1ML LUER SLIP (SYRINGE) ×3 IMPLANT
TOWEL OR 17X24 6PK STRL BLUE (TOWEL DISPOSABLE) ×6 IMPLANT
TOWEL OR 17X26 10 PK STRL BLUE (TOWEL DISPOSABLE) ×6 IMPLANT
TRAY CATH 16FR W/PLASTIC CATH (SET/KITS/TRAYS/PACK) IMPLANT
WATER STERILE IRR 1000ML POUR (IV SOLUTION) IMPLANT

## 2017-06-10 NOTE — Progress Notes (Signed)
Orthopedic Tech Progress Note Patient Details:  Jade Walker Nov 12, 1945 [id number]  CPM Right Knee CPM Right Knee: On Right Knee Flexion (Degrees): 40 Right Knee Extension (Degrees): 0  Ortho Devices Ortho Device/Splint Location: footsie roll Ortho Device/Splint Interventions: Ordered, Application, Adjustment   Jennye Moccasin 06/10/2017, 4:24 PM

## 2017-06-10 NOTE — Anesthesia Procedure Notes (Signed)
Anesthesia Regional Block: Adductor canal block   Pre-Anesthetic Checklist: ,, timeout performed, Correct Patient, Correct Site, Correct Laterality, Correct Procedure, Correct Position, site marked, Risks and benefits discussed,  Surgical consent,  Pre-op evaluation,  At surgeon's request and post-op pain management  Laterality: Right  Prep: chloraprep       Needles:  Injection technique: Single-shot  Needle Type: Stimiplex     Needle Length: 9cm  Needle Gauge: 21     Additional Needles:   Procedures:,,,, ultrasound used (permanent image in chart),,,,  Narrative:  Start time: 06/10/2017 12:22 PM End time: 06/10/2017 12:27 PM Injection made incrementally with aspirations every 5 mL.  Performed by: Personally  Anesthesiologist: Anitra Lauth RAY

## 2017-06-10 NOTE — Anesthesia Preprocedure Evaluation (Signed)
Anesthesia Evaluation  Patient identified by MRN, date of birth, ID band Patient awake    Reviewed: Allergy & Precautions, NPO status , Patient's Chart, lab work & pertinent test results  Airway Mallampati: II  TM Distance: <3 FB Neck ROM: Full    Dental  (+) Edentulous Upper, Edentulous Lower   Pulmonary neg shortness of breath, asthma , pneumonia, resolved, COPD,    breath sounds clear to auscultation       Cardiovascular hypertension, Pt. on medications  Rhythm:Regular Rate:Normal     Neuro/Psych  Headaches, Anxiety    GI/Hepatic hiatal hernia, GERD  ,  Endo/Other  Hypothyroidism   Renal/GU Renal InsufficiencyRenal disease     Musculoskeletal  (+) Arthritis ,   Abdominal   Peds  Hematology  (+) anemia ,   Anesthesia Other Findings   Reproductive/Obstetrics                             Anesthesia Physical  Anesthesia Plan  ASA: II  Anesthesia Plan: Spinal   Post-op Pain Management:  Regional for Post-op pain   Induction: Intravenous  PONV Risk Score and Plan: 2 and Ondansetron and Midazolam  Airway Management Planned: Simple Face Mask  Additional Equipment:   Intra-op Plan:   Post-operative Plan:   Informed Consent: I have reviewed the patients History and Physical, chart, labs and discussed the procedure including the risks, benefits and alternatives for the proposed anesthesia with the patient or authorized representative who has indicated his/her understanding and acceptance.   Dental advisory given  Plan Discussed with: CRNA  Anesthesia Plan Comments: (See Zelenak PA anesthesia Note . EF 55-60 per echo 12/18)        Anesthesia Quick Evaluation

## 2017-06-10 NOTE — Anesthesia Procedure Notes (Signed)
Spinal  Patient location during procedure: OR Start time: 06/10/2017 12:40 PM End time: 06/10/2017 12:45 PM Staffing Anesthesiologist: Anitra Lauth RAY Performed: anesthesiologist  Preanesthetic Checklist Completed: patient identified, site marked, surgical consent, pre-op evaluation, timeout performed, IV checked, risks and benefits discussed and monitors and equipment checked Spinal Block Patient position: sitting Prep: ChloraPrep Patient monitoring: heart rate, cardiac monitor, continuous pulse ox and blood pressure Approach: midline Location: L3-4 Injection technique: single-shot Needle Needle type: Quincke  Needle gauge: 22 G Needle length: 9 cm

## 2017-06-10 NOTE — Brief Op Note (Signed)
06/10/2017  3:55 PM  PATIENT:  Jade Walker  71 y.o. female  PRE-OPERATIVE DIAGNOSIS:  RIGHT KNEE OSTEOARTHRITIS  POST-OPERATIVE DIAGNOSIS:  RIGHT KNEE OSTEOARTHRITIS  PROCEDURE:  Procedure(s): RIGHT TOTAL KNEE ARTHROPLASTY  SURGEON:  Surgeon(s): Cammy Copa, MD  ASSISTANT: Patrick Jupiter rnfa  ANESTHESIA:   spinal  EBL: 100 ml    Total I/O In: 650 [I.V.:650] Out: 20 [Blood:20]  BLOOD ADMINISTERED: none  DRAINS: none   LOCAL MEDICATIONS USED:  Marcaine morphine clonidine Xparell  SPECIMEN:  No Specimen  COUNTS:  YES  TOURNIQUET:   Total Tourniquet Time Documented: Thigh (Right) - 83 minutes Total: Thigh (Right) - 83 minutes   DICTATION: .Other Dictation: Dictation Number [id number]  PLAN OF CARE: Admit to inpatient   PATIENT DISPOSITION:  PACU - hemodynamically stable

## 2017-06-10 NOTE — H&P (Signed)
TOTAL KNEE ADMISSION H&P  Patient is being admitted for right total knee arthroplasty.  Subjective:  Chief Complaint:right knee pain.  HPI: Jade Walker, 71 y.o. female, has a history of pain and functional disability in the right knee due to arthritis and has failed non-surgical conservative treatments for greater than 12 weeks to includeNSAID's and/or analgesics, corticosteriod injections, viscosupplementation injections and activity modification.  Onset of symptoms was gradual, starting >10 years ago with gradually worsening course since that time. The patient noted no past surgery on the right knee(s).  Patient currently rates pain in the right knee(s) at 9 out of 10 with activity. Patient has night pain, worsening of pain with activity and weight bearing, pain that interferes with activities of daily living, pain with passive range of motion and joint swelling.  Patient has evidence of subchondral cysts, subchondral sclerosis and joint space narrowing by imaging studies. This patient has had A good result with her left total knee replacement done 2 years ago. There is no active infection.  Patient Active Problem List   Diagnosis Date Noted  . 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 S/P shoulder replacement 10/06/2015  . Degenerative arthritis of knee 04/18/2015   Past Medical History:  Diagnosis Date  . Anxiety   . 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 Asthma   . CKD (chronic kidney disease), stage III (HCC)   . Complication of anesthesia    blood loss after back surgery because hernia was scratched and "torn" during intubation (02/15/11 EGD showed GIB d/t esophageal ulcer)  . COPD (chronic obstructive pulmonary disease) (HCC)   . Environmental and seasonal allergies   . Full dentures   . GERD (gastroesophageal reflux disease)   . Headache    sinus  . History of hiatal hernia   . History of kidney stones   . History of stress test 2012   pt. reports it was resulted normal-HPR  . Hypertension   . Hypothyroidism   . Iron deficiency anemia    from Care Everywhere  . Osteoporosis   . Pneumonia   . Shortness of breath dyspnea    climbing steps  . Stress incontinence    from Care Everywhere    Past Surgical History:  Procedure Laterality Date  . ABDOMINAL HYSTERECTOMY    . BACK SURGERY     lumbar x 3, 1 thoracic spine  . CHOLECYSTECTOMY    . COLONOSCOPY    . EYE SURGERY Bilateral    cataract surgery with lens implant  . FRACTURE SURGERY    . HERNIA REPAIR     hiatal hernia "removed"- HPR, 1990  . JOINT REPLACEMENT Left 04/2105   knee  . OPEN REDUCTION INTERNAL FIXATION (ORIF) DISTAL RADIAL FRACTURE Left 03/14/2015   Procedure: OPEN REDUCTION INTERNAL FIXATION (ORIF) LEFT DISTAL RADIUS FRACTURE;  Surgeon: Cammy Copa, MD;  Location: [facility] OR;  Service: Orthopedics;  Laterality: Left;  . TONSILLECTOMY    . TOTAL KNEE ARTHROPLASTY Left 04/18/2015   Procedure: TOTAL KNEE ARTHROPLASTY;  Surgeon: Cammy Copa, MD;  Location: [facility] OR;  Service: Orthopedics;  Laterality: Left;  . TOTAL SHOULDER ARTHROPLASTY Right 10/06/2015   Procedure: RIGHT TOTAL SHOULDER ARTHROPLASTY;  Surgeon: Beverely Low, MD;  Location: [facility] OR;  Service: Orthopedics;  Laterality: Right;  . TOTAL SHOULDER ARTHROPLASTY Left 11/22/2016   Procedure: TOTAL SHOULDER ARTHROPLASTY;  Surgeon: Beverely Low, MD;  Location[facility] OR;  Service: Orthopedics;  Laterality: Left;  Prescriptions Prior to Admission  Medication Sig Dispense Refill Last Dose  . albuterol (PROVENTIL HFA;VENTOLIN HFA) 108 (90 Base) MCG/ACT inhaler Inhale 1-2 puffs into the lungs every 6 (six) hours as needed for wheezing or shortness of breath.   Past Week at Unknown time  . albuterol (PROVENTIL) (2.5 MG/3ML) 0.083% nebulizer solution Take 2.5 mg by nebulization every 6 (six) hours as needed for wheezing or shortness of breath. i puff once a day as needed   Past Week at Unknown time  .  cetirizine (ZYRTEC) 10 MG tablet Take 10 mg by mouth daily.   06/10/2017 at 1000  . citalopram (CELEXA) 10 MG tablet Take 10 mg by mouth daily.   06/10/2017 at 1000  . famotidine (PEPCID) 40 MG tablet Take 40 mg by mouth 2 (two) times daily.  3 06/10/2017 at 1000  . ferrous sulfate 325 (65 FE) MG EC tablet Take 325 mg by mouth daily with breakfast.   06/09/2017 at Unknown time  . levothyroxine (SYNTHROID, LEVOTHROID) 50 MCG tablet Take 50 mcg by mouth daily before breakfast.  5 06/10/2017 at 0500  . losartan (COZAAR) 50 MG tablet Take 50 mg by mouth daily.   06/09/2017 at Unknown time  . montelukast (SINGULAIR) 10 MG tablet Take 10 mg by mouth at bedtime.   06/09/2017 at Unknown time  . oxyCODONE (ROXICODONE) 5 MG immediate release tablet Take 1-2 tablets (5-10 mg total) by mouth every 4 (four) hours as needed for severe pain. (Patient taking differently: Take 5 mg by mouth every 8 (eight) hours as needed for severe pain. ) 60 tablet 0 06/10/2017 at 1000  . tiZANidine (ZANAFLEX) 4 MG tablet Take 1 tablet (4 mg total) by mouth every 6 (six) hours as needed for muscle spasms. (Patient taking differently: Take 4 mg by mouth every 8 (eight) hours as needed for muscle spasms. ) 30 tablet 0 06/09/2017 at Unknown time  . Vitamin D, Ergocalciferol, (DRISDOL) 50000 UNITS CAPS capsule Take 50,000 Units by mouth every Monday.    06/09/2017 at Unknown time  . oxyCODONE-acetaminophen (ROXICET) 5-325 MG tablet Take 1-2 tablets by mouth every 4 (four) hours as needed for severe pain. (Patient not taking: Reported on 05/29/2017) 60 tablet 0 Completed Course at Unknown time   Allergies  Allergen Reactions  . Codeine Rash and Other (See Comments)    SWEATING "HEART FLUTTERING"   . Sulfamethoxazole-Trimethoprim Rash    Social History  Substance Use Topics  . Smoking status: Never Smoker  . Smokeless tobacco: Never Used  . Alcohol use No    Family History  Problem Relation Age of Onset  . CAD Mother   . Emphysema  Father   . Alcoholism Father      Review of Systems  Musculoskeletal: Positive for joint pain.  All other systems reviewed and are negative.   Objective:  Physical Exam  Constitutional: She appears well-developed.  HENT:  Head: Normocephalic.  Eyes: Pupils are equal, round, and reactive to light.  Neck: Normal range of motion.  Cardiovascular: Normal rate.   Respiratory: Effort normal.  Neurological: She is alert.  Skin: Skin is warm.  Psychiatric: She has a normal mood and affect.  Examination the right knee demonstrates intact skin with intact ankle dorsiflexion plantar flexion.  Medial and lateral joint space tenderness is present.  Mild effusion is present.  Extensor mechanism is intact.  No groin pain with internal/external rotation of the leg.  No other masses lymph adenopathy or skin changes noted in  the right knee region.  Vital signs in last 24 hours: Temp:  [98 F (36.7 C)] 98 F (36.7 C) (10/02 1135) Pulse Rate:  [76] 76 (10/02 1135) Resp:  [18] 18 (10/02 1135) BP: (190)/(80) 190/80 (10/02 1135) SpO2:  [100 %] 100 % (10/02 1135)  Labs:   Estimated body mass index is 28.25 kg/m as calculated from the following:   Height as of 06/02/17:  (1.676 m).   Weight as of 06/02/17: 175 lb 0.7 oz (79.4 kg).   Imaging Review Plain radiographs demonstrate moderate degenerative joint disease of the right knee(s). The overall alignment ismild varus. The bone quality appears to be good for age and reported activity level.  Assessment/Plan:  End stage arthritis, right knee   The patient history, physical examination, clinical judgment of the provider and imaging studies are consistent with end stage degenerative joint disease of the right knee(s) and total knee arthroplasty is deemed medically necessary. The treatment options including medical management, injection therapy arthroscopy and arthroplasty were discussed at length. The risks and benefits of total knee  arthroplasty were presented and reviewed. The risks due to aseptic loosening, infection, stiffness, patella tracking problems, thromboembolic complications and other imponderables were discussed. The patient acknowledged the explanation, agreed to proceed with the plan and consent was signed. Patient is being admitted for inpatient treatment for surgery, pain control, PT, OT, prophylactic antibiotics, VTE prophylaxis, progressive ambulation and ADL's and discharge planning. The patient is planning to be discharged home with home health services preoperative urinalysis showed Escherichia coli but this was likely a contaminant due to absence of positive nitrites in the urine and absence of symptoms due to the patient's history of MRSA we will use vancomycin preoperatively

## 2017-06-10 NOTE — Progress Notes (Signed)
Report given to ronda hunt rn

## 2017-06-10 NOTE — Transfer of Care (Signed)
Immediate Anesthesia Transfer of Care Note  Patient: Jade Walker  Procedure(s) Performed: RIGHT TOTAL KNEE ARTHROPLASTY (Right Knee)  Patient Location: PACU  Anesthesia Type:Spinal and MAC combined with regional for post-op pain  Level of Consciousness: awake, alert , oriented and patient cooperative  Airway & Oxygen Therapy: Patient Spontanous Breathing  Post-op Assessment: Report given to RN, Post -op Vital signs reviewed and stable and Patient moving all extremities X 4  Post vital signs: Reviewed and stable  Last Vitals:  Vitals:   06/10/17 1205 06/10/17 1210  BP:  (!) 170/72  Pulse: (!) 54 (!) 57  Resp: 11 13  Temp:    SpO2: 99% 98%    Last Pain:  Vitals:   06/10/17 1153  PainSc: 6       Patients Stated Pain Goal: 3 (23/53/61 4431)  Complications: No apparent anesthesia complications

## 2017-06-11 ENCOUNTER — Encounter (HOSPITAL_COMMUNITY): Payer: Self-pay | Admitting: Orthopedic Surgery

## 2017-06-11 NOTE — Progress Notes (Signed)
Subjective: Pt stable   Objective: Vital signs in last 24 hours: Temp:  [97 F (36.1 C)-99.7 F (37.6 C)] 99.5 F (37.5 C) (10/03 0521) Pulse Rate:  [51-83] 83 (10/03 0521) Resp:  [10-19] 17 (10/03 0521) BP: (88-190)/(51-80) 117/70 (10/03 0521) SpO2:  [92 %-100 %] 92 % (10/03 0521) Weight:  [175 lb 0.7 oz (79.4 kg)] 175 lb 0.7 oz (79.4 kg) (10/02 1153)  Intake/Output from previous day: 10/02 0701 - 10/03 0700 In: 1250 [P.O.:600; I.V.:650] Out: 20 [Blood:20] Intake/Output this shift: No intake/output data recorded.  Exam:  Dorsiflexion/Plantar flexion intact  Labs: No results for input(s): HGB in the last 72 hours. No results for input(s): WBC, RBC, HCT, PLT in the last 72 hours. No results for input(s): NA, K, CL, CO2, BUN, CREATININE, GLUCOSE, CALCIUM in the last 72 hours. No results for input(s): LABPT, INR in the last 72 hours.  Assessment/Plan: Plan dc am - pt today   G Dorene Grebe 06/11/2017, 7:04 AM

## 2017-06-11 NOTE — Progress Notes (Signed)
Orthopedic Tech Progress Note Patient Details:  Jade Walker 1945/09/16 [id number] Placed pt's rle on cpm -40 degrees ;RN notified Patient ID: Jade Walker, female   DOB: Feb 11, 1946, 71 y.o.   MRN: [id number]   [name] 06/11/2017, 1:43 PM

## 2017-06-11 NOTE — Care Management Note (Signed)
Case Management Note  Patient Details  Name: Jade Walker MRN: [id number] Date of Birth: [birth year]  Subjective/Objective:     71 yr old female s/p right total knee arthroplasty.               Action/Plan: Case manager spoke with patient's husband concerning discharge plan and DME. Choice for Home Health agency was offered. Referral was called to Shaune Leeks, [facility] Liaison. Patient has Rolling walker and 3in1 at home, she will have family support at discharge.   Expected Discharge Date:   06/12/17               Expected Discharge Plan:  Home w Home Health Services  In-House Referral:  NA  Discharge planning Services  CM Consult  Post Acute Care Choice:  Durable Medical Equipment, Home Health Choice offered to:  Patient, Spouse  DME Arranged:  3-N-1, CPM, Walker rolling DME Agency:  [employer]  HH Arranged:  PT HH Agency:  [facility]  Status of Service:  Completed, signed off  If discussed at Long Length of Stay Meetings, dates discussed:    Additional Comments:  Durenda Guthrie, RN 06/11/2017, 10:40 AM

## 2017-06-11 NOTE — Anesthesia Postprocedure Evaluation (Signed)
Anesthesia Post Note  Patient: Jade Walker  Procedure(s) Performed: RIGHT TOTAL KNEE ARTHROPLASTY (Right Knee)     Patient location during evaluation: PACU Anesthesia Type: Spinal, Regional and MAC Level of consciousness: awake and alert Pain management: pain level controlled Vital Signs Assessment: post-procedure vital signs reviewed and stable Respiratory status: spontaneous breathing, nonlabored ventilation, respiratory function stable and patient connected to nasal cannula oxygen Cardiovascular status: stable and blood pressure returned to baseline Postop Assessment: no apparent nausea or vomiting and spinal receding Anesthetic complications: no    Last Vitals:  Vitals:   06/11/17 1532 06/11/17 1742  BP: (!) 99/56 (!) 109/58  Pulse: 65 70  Resp: 18   Temp: 36.7 C   SpO2: 96%     Last Pain:  Vitals:   06/11/17 1742  TempSrc:   PainSc: 6                  Ashely Joshua

## 2017-06-11 NOTE — Progress Notes (Signed)
Physical Therapy Treatment Patient Details Name: Jade Walker MRN: [id number] DOB: 02-23-1946 Today's Date: 06/11/2017    History of Present Illness Patient is a 71 y/o female s/p R TKA on 06/10/17. PMH positive for arthritis, COPD, CKD III, DDD, HTN, HH, GERD.   PT Comments    Pt slowly progressing with mobility. Able to transfer and amb in room with RW and min guard for balance. Remains limited by c/o "wooziness all day". BP 99/57 sitting EOB post-amb, 97/55 in supine, and 99/60 supine ~2 min (RN aware). Will plan for stair training and therex progression tomorrow morning. Will continue to follow acutely.   Follow Up Recommendations  DC plan and follow up therapy as arranged by surgeon;Home health PT     Equipment Recommendations  None recommended by PT (owns DME)    Recommendations for Other Services       Precautions / Restrictions Precautions Precautions: Fall;Knee Precaution Booklet Issued: No Restrictions Weight Bearing Restrictions: Yes RLE Weight Bearing: Weight bearing as tolerated    Mobility  Bed Mobility Overal bed mobility: Needs Assistance Bed Mobility: Sit to Supine     Supine to sit: Min guard Sit to supine: Supervision   General bed mobility comments: Min guard for safety; no physical assist required. HOB flat with use of bed rails.  Transfers Overall transfer level: Needs assistance Equipment used: Rolling walker (2 wheeled) Transfers: Sit to/from Stand Sit to Stand: Min guard         General transfer comment: Stood x2 with from chair and toilet with min guard for balanace; indep for pericare at toilet  Ambulation/Gait Ambulation/Gait assistance: Min guard Ambulation Distance (Feet): 50 Feet Assistive device: Rolling walker (2 wheeled) Gait Pattern/deviations: Step-to pattern;Step-through pattern;Antalgic;Decreased weight shift to right Gait velocity: Decreased Gait velocity interpretation: <1.8 ft/sec, indicative of risk for recurrent  falls General Gait Details: Slow, antalgic gait in room with RW and min guard for balance. Step-through gait patterned encouraged for max knee ext. Cues for keeping RW closer to body. Pt reports increased "wooziness" with mobility. BP reading 99/57 post-amb   Stairs            Wheelchair Mobility    Modified Rankin (Stroke Patients Only)       Balance Overall balance assessment: Needs assistance Sitting-balance support: Feet supported;No upper extremity supported Sitting balance-Leahy Scale: Good     Standing balance support: No upper extremity supported;During functional activity;Bilateral upper extremity supported Standing balance-Leahy Scale: Fair Standing balance comment: Able to static stand with no UE support                            Cognition Arousal/Alertness: Awake/alert Behavior During Therapy: WFL for tasks assessed/performed Overall Cognitive Status: Within Functional Limits for tasks assessed                                        Exercises      General Comments        Pertinent Vitals/Pain Pain Assessment: Faces Faces Pain Scale: Hurts little more Pain Location: R knee Pain Descriptors / Indicators: Grimacing;Sore;Operative site guarding Pain Intervention(s): Monitored during session;Repositioned    Home Living Family/patient expects to be discharged to:: Private residence Living Arrangements: Spouse/significant other Available Help at Discharge: Family;Available 24 hours/day Type of Home: House Home Access: Stairs to enter Entrance Stairs-Rails: Right;Left Home Layout: One  level Home Equipment: Walker - 2 wheels;Cane - single point;Shower seat;Bedside commode      Prior Function Level of Independence: Independent          PT Goals (current goals can now be found in the care plan section) Acute Rehab PT Goals Patient Stated Goal: to get better and go home PT Goal Formulation: With patient/family Time For  Goal Achievement: 06/14/17 Potential to Achieve Goals: Good Progress towards PT goals: Progressing toward goals    Frequency    7X/week      PT Plan Current plan remains appropriate    Co-evaluation              AM-PAC PT "6 Clicks" Daily Activity  Outcome Measure  Difficulty turning over in bed (including adjusting bedclothes, sheets and blankets)?: None Difficulty moving from lying on back to sitting on the side of the bed? : None Difficulty sitting down on and standing up from a chair with arms (e.g., wheelchair, bedside commode, etc,.)?: A Little Help needed moving to and from a bed to chair (including a wheelchair)?: A Little Help needed walking in hospital room?: A Little Help needed climbing 3-5 steps with a railing? : A Little 6 Click Score: 20    End of Session Equipment Utilized During Treatment: Gait belt Activity Tolerance: Patient tolerated treatment well;Patient limited by fatigue Patient left: in bed;with call bell/phone within reach Nurse Communication: Mobility status PT Visit Diagnosis: Difficulty in walking, not elsewhere classified (R26.2);Pain Pain - Right/Left: Right Pain - part of body: Knee     Time: 4098-1191 PT Time Calculation (min) (ACUTE ONLY): 23 min  Charges:  $Gait Training: 8-22 mins $Therapeutic Activity: 8-22 mins                    G Codes:      Ina Homes, PT, DPT Acute Rehab Services  Pager: [phone]  Malachy Chamber 06/11/2017, 4:58 PM

## 2017-06-11 NOTE — Op Note (Signed)
NAME:  Jade Walker, Jade Walker                  ACCOUNT NO.:  MEDICAL RECORD NO.:  [id number]  LOCATION:                                 FACILITY:  PHYSICIAN:  Burnard Bunting, M.D.         DATE OF BIRTH:  DATE OF PROCEDURE: DATE OF DISCHARGE:                              OPERATIVE REPORT   PREOPERATIVE DIAGNOSIS:  Right knee arthritis.  POSTOPERATIVE DIAGNOSIS:  Right knee arthritis.  PROCEDURE:  Right total knee replacement, Stryker 2 femur, 3 tibia, 9-mm polyethylene insert, 29-mm cemented patella, posterior cruciate- retaining cemented prosthesis.  SURGEON:  Burnard Bunting, M.D.  ASSISTANT:  Patrick Jupiter, RNFA.  INDICATIONS:  Jade Walker is a 71 year old patient with right knee pain with failure of nonoperative management and end-stage arthritis noted on radiographs.  She presents now for operative management after explanation of risks and benefits.  PROCEDURE IN DETAIL:  The patient was brought to the operating room where general anesthetic was induced.  Preoperative IV antibiotics were administered.  Time-out was called.  The patient's right leg was prescrubbed with alcohol and Betadine, and allowed to air dry, prepped with DuraPrep solution and draped in a sterile manner.  Jade Walker was used to cover the operative field.  Leg was then elevated, exsanguinated with the Esmarch wrap.  Tourniquet was inflated to 300 mmHg.  Total tourniquet time 83 minutes.  Anterior approach to the knee was made. Skin and subcutaneous tissue were sharply divided.  Median parapatellar approach was made and the patella was everted.  At this time, precise location of the arthrotomy was marked with a #1 Vicryl suture.  Fat pad partially excised.  Soft tissue dissection performed medially.  Lateral patellofemoral ligament was released and soft tissue removed off the anterior distal femur.  Using intramedullary alignment, the tibia was cut distance of 4 mm off the most affected medial side.  This was done with  the collaterals and posterior neurovascular structures protected. Intramedullary alignment also used to make an 8-mm cut off the distal femur, which was then revised to a 10-mm cut.  At this time, a 9-mm spacer was placed and the patient was found to have full extension. Next, the anterior, posterior and chamber cuts were made and the tibia was keel punched.  Patella had significant wear on the lateral facet. It was cut down to 11 mm.  Asymmetric patella trial was then placed. With trial components in position, the patient achieved full extension and nearly full flexion with a 9-mm spacer with only slight lift-off beyond 120 degrees.  Knee was stable to varus and valgus stress at 0, 30, and 90 degrees.  Trial components were removed.  Thorough irrigation performed.  Exparel with Marcaine was injected into the knee joint capsule and then at that time, tranexamic acid topically was placed for 3 minutes.  This was removed and then true components were cemented into position with excellent range of motion and stability parameters maintained.  Excess cement removed.  Tourniquet released.  Bleeding points were encountered and controlled using electrocautery.  Thorough irrigation again performed and then the incision was closed over bolster using #1 Vicryl suture at the arthrotomy, 0  Vicryl suture, 2-0 Vicryl suture, and a running 3-0 Monocryl.  Steri-Strips and Aquacel dressing applied followed by Webril, Ace wrap and a knee immobilizer.  The patient tolerated the procedure well without immediate complication. Transferred to the recovery room in stable condition.     Burnard Bunting, M.D.     GSD/MEDQ  D:  06/10/2017  T:  06/11/2017  Job:  [id number]

## 2017-06-11 NOTE — Evaluation (Signed)
Occupational Therapy Evaluation Patient Details Name: Jade Walker MRN: [id number] DOB: [birth year] Today's Date: 06/11/2017    History of Present Illness Patient is a 71 y/o female admitted for R TKA.  PMH positive for arthritis, COPD, CKD III, DDD, HTN, HH, GERD.   Clinical Impression   Pt reports she was independent with ADL PTA. Currently pt requires min assist for functional mobility and mod assist for LB ADL. Began knee, safety, and ADL education with pt and husband. Pt planning to d/c home with 24/7 supervision from her husband. Pt would benefit from continued skilled OT to address established goals.    Follow Up Recommendations  DC plan and follow up therapy as arranged by surgeon;Supervision/Assistance - 24 hour    Equipment Recommendations  None recommended by OT    Recommendations for Other Services       Precautions / Restrictions Precautions Precautions: Fall;Knee Precaution Booklet Issued: No Restrictions Weight Bearing Restrictions: Yes RLE Weight Bearing: Weight bearing as tolerated      Mobility Bed Mobility Overal bed mobility: Needs Assistance Bed Mobility: Supine to Sit     Supine to sit: Min guard     General bed mobility comments: Min guard for safety; no physical assist required. HOB flat with use of bed rails.  Transfers Overall transfer level: Needs assistance Equipment used: Rolling walker (2 wheeled) Transfers: Sit to/from Stand Sit to Stand: Min guard Stand pivot transfers: Min assist       General transfer comment: for safety. Cues for hand placement and technique    Balance Overall balance assessment: Needs assistance Sitting-balance support: Feet supported;No upper extremity supported Sitting balance-Leahy Scale: Good Sitting balance - Comments: leaning posterior sitting EOB, but maintaining balance with cues   Standing balance support: No upper extremity supported;During functional activity Standing balance-Leahy Scale:  Fair Standing balance comment: Able to stand at sink and wash hands without support                           ADL either performed or assessed with clinical judgement   ADL Overall ADL's : Needs assistance/impaired Eating/Feeding: Set up;Sitting   Grooming: Min guard;Wash/dry hands;Standing   Upper Body Bathing: Set up;Supervision/ safety;Sitting   Lower Body Bathing: Moderate assistance;Sit to/from stand   Upper Body Dressing : Set up;Supervision/safety;Sitting   Lower Body Dressing: Moderate assistance;Sit to/from stand   Toilet Transfer: Minimal assistance;Ambulation;Comfort height toilet;RW;Grab bars   Toileting- Clothing Manipulation and Hygiene: Min guard;Sitting/lateral lean Toileting - Clothing Manipulation Details (indicate cue type and reason): for peri care     Functional mobility during ADLs: Minimal assistance;Rolling walker       Vision         Perception     Praxis      Pertinent Vitals/Pain Pain Assessment: Faces Faces Pain Scale: Hurts even more Pain Location: R knee Pain Descriptors / Indicators: Aching;Sore Pain Intervention(s): Monitored during session;Repositioned     Hand Dominance Right   Extremity/Trunk Assessment Upper Extremity Assessment Upper Extremity Assessment: Overall WFL for tasks assessed   Lower Extremity Assessment Lower Extremity Assessment: Defer to PT evaluation       Communication Communication Communication: No difficulties   Cognition Arousal/Alertness: Awake/alert Behavior During Therapy: WFL for tasks assessed/performed Overall Cognitive Status: Within Functional Limits for tasks assessed  General Comments      Exercises    Shoulder Instructions      Home Living Family/patient expects to be discharged to:: Private residence Living Arrangements: Spouse/significant other Available Help at Discharge: Family;Available 24 hours/day Type of  Home: House Home Access: Stairs to enter [employer] of Steps: 5 Entrance Stairs-Rails: Right;Left Home Layout: One level     Bathroom Shower/Tub: Chief Strategy Officer: Handicapped height     Home Equipment: Environmental consultant - 2 wheels;Cane - single point;Shower seat;Bedside commode          Prior Functioning/Environment Level of Independence: Independent                 OT Problem List: Decreased strength;Decreased range of motion;Impaired balance (sitting and/or standing);Decreased knowledge of use of DME or AE;Decreased knowledge of precautions;Pain;Obesity      OT Treatment/Interventions: Self-care/ADL training;Energy conservation;DME and/or AE instruction;Therapeutic activities;Patient/family education;Balance training    OT Goals(Current goals can be found in the care plan section) Acute Rehab OT Goals Patient Stated Goal: to get better and go home OT Goal Formulation: With patient/family Time For Goal Achievement: 06/25/17 Potential to Achieve Goals: Good ADL Goals Pt Will Perform Lower Body Bathing: with supervision;sit to/from stand Pt Will Perform Lower Body Dressing: with supervision;sit to/from stand Pt Will Perform Tub/Shower Transfer: with supervision;Tub transfer;ambulating;shower seat;rolling walker  OT Frequency: Min 2X/week   Barriers to D/C:            Co-evaluation              AM-PAC PT "6 Clicks" Daily Activity     Outcome Measure Help from another person eating meals?: None Help from another person taking care of personal grooming?: A Little Help from another person toileting, which includes using toliet, bedpan, or urinal?: A Little Help from another person bathing (including washing, rinsing, drying)?: A Lot Help from another person to put on and taking off regular upper body clothing?: A Little Help from another person to put on and taking off regular lower body clothing?: A Lot 6 Click Score: 17   End of Session  Equipment Utilized During Treatment: Gait belt;Rolling walker CPM Right Knee CPM Right Knee: Off  Activity Tolerance: Patient tolerated treatment well Patient left: in chair;with call bell/phone within reach;with family/visitor present  OT Visit Diagnosis: Unsteadiness on feet (R26.81);Other abnormalities of gait and mobility (R26.89);Pain Pain - Right/Left: Right Pain - part of body: Knee                Time: 1610-9604 OT Time Calculation (min): 21 min Charges:  OT General Charges $OT Visit: 1 Visit OT Evaluation $OT Eval Moderate Complexity: 1 Mod G-Codes:     Georgene Kopper A. Brett Albino, M.S., OTR/L Pager: [phone]  Gaye Alken 06/11/2017, 3:56 PM

## 2017-06-11 NOTE — Evaluation (Signed)
Physical Therapy Evaluation Patient Details Name: Jade Walker MRN: [id number] DOB: 1946/04/17 Today's Date: 06/11/2017   History of Present Illness  Patient is a 71 y/o female admitted for R TKA.  PMH positive for arthritis, COPD, CKD III, DDD, HTN, HH, GERD.  Clinical Impression  Patient presents with decreased mobility due to weakness and decreased ROM R LE as well as lethargy and poor safety awareness.  She will benefit from skilled PT in the acute setting to allow d/c home with family assist and follow up HHPT.    Follow Up Recommendations Home health PT    Equipment Recommendations  None recommended by PT    Recommendations for Other Services       Precautions / Restrictions Precautions Precautions: Fall;Knee Precaution Booklet Issued: Yes (comment) Restrictions RLE Weight Bearing: Weight bearing as tolerated      Mobility  Bed Mobility Overal bed mobility: Needs Assistance Bed Mobility: Supine to Sit     Supine to sit: HOB elevated;Min assist     General bed mobility comments: assist for supporting trunk as coming upright  Transfers Overall transfer level: Needs assistance Equipment used: Rolling walker (2 wheeled) Transfers: Sit to/from [employer] Transfers Sit to Stand: Min assist;Supervision Stand pivot transfers: Min assist       General transfer comment: assist up from chair then to [facility] with RW, pt then stood impulsively without assist from [employer] and started walking towards recliner cues for safety and assist for lines/IV, etc.   Ambulation/Gait Ambulation/Gait assistance: Min assist Ambulation Distance (Feet): 6 Feet Assistive device: Rolling walker (2 wheeled) Gait Pattern/deviations: Step-to pattern;Decreased stride length        Stairs            Wheelchair Mobility    Modified Rankin (Stroke Patients Only)       Balance Overall balance assessment: Needs assistance   Sitting balance-Leahy Scale: Fair Sitting balance  - Comments: leaning posterior sitting EOB, but maintaining balance with cues   Standing balance support: Bilateral upper extremity supported Standing balance-Leahy Scale: Poor Standing balance comment: UE support for balance                             Pertinent Vitals/Pain Pain Assessment: 0-10 Pain Score: 9  Pain Location: bottom Pain Descriptors / Indicators: Aching Pain Intervention(s): Monitored during session    Home Living Family/patient expects to be discharged to:: Private residence Living Arrangements: Spouse/significant other   Type of Home: House Home Access: Stairs to enter Entrance Stairs-Rails: Doctor, general practice of Steps: 5 Home Layout: One level Home Equipment: Environmental consultant - 2 wheels;Cane - single point;Shower seat      Prior Function Level of Independence: Independent               Hand Dominance   Dominant Hand: Right    Extremity/Trunk Assessment   Upper Extremity Assessment Upper Extremity Assessment: Defer to OT evaluation    Lower Extremity Assessment Lower Extremity Assessment: RLE deficits/detail RLE Deficits / Details: AAROM knee 8-70. strength at least 3/5 knee extension, 2+/5 hip flexion       Communication   Communication: No difficulties  Cognition Arousal/Alertness: Lethargic Behavior During Therapy: WFL for tasks assessed/performed Overall Cognitive Status: Within Functional Limits for tasks assessed  General Comments General comments (skin integrity, edema, etc.): Spouse in room and concerned about pt's lethargy    Exercises Total Joint Exercises Ankle Circles/Pumps: AROM;Both;10 reps;Supine Quad Sets: AROM;Right;10 reps;Supine Short Arc Quad: AROM;Right;10 reps;Supine Heel Slides: AAROM;Right;5 reps;Supine Hip ABduction/ADduction: AROM;Right;5 reps;Supine Straight Leg Raises: AROM;AAROM;Right;5 reps;Supine   Assessment/Plan    PT  Assessment Patient needs continued PT services  PT Problem List Decreased strength;Decreased range of motion;Decreased balance;Decreased mobility;Decreased knowledge of precautions;Decreased knowledge of use of DME;Pain       PT Treatment Interventions DME instruction;Gait training;Stair training;Functional mobility training;Therapeutic activities;Therapeutic exercise;Patient/family education;Balance training;Neuromuscular re-education    PT Goals (Current goals can be found in the Care Plan section)  Acute Rehab PT Goals Patient Stated Goal: To be more awake and go home PT Goal Formulation: With patient/family Time For Goal Achievement: 06/14/17 Potential to Achieve Goals: Good    Frequency 7X/week   Barriers to discharge        Co-evaluation               AM-PAC PT "6 Clicks" Daily Activity  Outcome Measure Difficulty turning over in bed (including adjusting bedclothes, sheets and blankets)?: Unable Difficulty moving from lying on back to sitting on the side of the bed? : Unable Difficulty sitting down on and standing up from a chair with arms (e.g., wheelchair, bedside commode, etc,.)?: Unable Help needed moving to and from a bed to chair (including a wheelchair)?: A Little Help needed walking in hospital room?: A Little Help needed climbing 3-5 steps with a railing? : A Little 6 Click Score: 12    End of Session Equipment Utilized During Treatment: Gait belt Activity Tolerance: Patient limited by lethargy Patient left: with call bell/phone within reach;with family/visitor present;in chair   PT Visit Diagnosis: Difficulty in walking, not elsewhere classified (R26.2);Pain Pain - Right/Left: Right Pain - part of body: Knee    Time: 1000-1030 PT Time Calculation (min) (ACUTE ONLY): 30 min   Charges:   PT Evaluation $PT Eval Moderate Complexity: 1 Mod PT Treatments $Therapeutic Activity: 8-22 mins   PT G CodesSheran Lawless,  [place] [phone] 06/11/2017   Elray Mcgregor 06/11/2017, 12:06 PM

## 2017-06-12 MED ORDER — OXYCODONE HCL 5 MG PO TABS: 5.0000 mg | ORAL_TABLET | ORAL | 0 refills | Status: DC | PRN

## 2017-06-12 MED ORDER — ASPIRIN 325 MG PO TBEC: 325.0000 mg | DELAYED_RELEASE_TABLET | Freq: Every day | ORAL | 0 refills | Status: DC

## 2017-06-12 NOTE — Progress Notes (Signed)
Subjective: Pt stable - pain ok   Objective: Vital signs in last 24 hours: Temp:  [98.1 F (36.7 C)-99.1 F (37.3 C)] 99.1 F (37.3 C) (10/04 0535) Pulse Rate:  [65-76] 76 (10/04 0535) Resp:  [16-18] 16 (10/04 0535) BP: (99-126)/(56-66) 126/66 (10/04 0535) SpO2:  [92 %-96 %] 92 % (10/04 0535)  Intake/Output from previous day: 10/03 0701 - 10/04 0700 In: 1330 [P.O.:480; I.V.:850] Out: -  Intake/Output this shift: No intake/output data recorded.  Exam:  Dorsiflexion/Plantar flexion intact  Labs: No results for input(s): HGB in the last 72 hours. No results for input(s): WBC, RBC, HCT, PLT in the last 72 hours. No results for input(s): NA, K, CL, CO2, BUN, CREATININE, GLUCOSE, CALCIUM in the last 72 hours. No results for input(s): LABPT, INR in the last 72 hours.  Assessment/Plan: Plan dc today   G Scott August Saucer 06/12/2017, 7:41 AM

## 2017-06-12 NOTE — Progress Notes (Deleted)
OT Note - Addendum    06/12/17 1500  OT Visit Information  Last OT Received On 06/12/17  OT Time Calculation  OT Start Time (ACUTE ONLY) 1244  OT Stop Time (ACUTE ONLY) 1322  OT Time Calculation (min) 38 min  [facility], OT/L  [phone] 06/12/2017

## 2017-06-12 NOTE — Therapy (Signed)
Occupational Therapy Treatment Patient Details Name: Jade Walker MRN: [id number] DOB: 23-Jul-1946 Today's Date: 06/12/2017    History of present illness Patient is a 71 y/o female s/p R TKA on 06/10/17. PMH positive for arthritis, COPD, CKD III, DDD, HTN, HH, GERD.   OT comments  Pt demonstrates good progress towards OT goals. Focus of today's session on increased independence with functional mobility and LB ADLs. Pt required min assist during toilet transfer for balance and safety. Completed education with pt and husband on LB dressing including donning/doffing knee immobilizer. Pt advised to sponge bathe initially upon d/c. Pt safe to d/c home with 24 hour assistance as needed when ready. OT will continue to follow acutely to address established goals.    Follow Up Recommendations  DC plan and follow up therapy as arranged by surgeon;Supervision/Assistance - 24 hour    Equipment Recommendations  None recommended by OT    Recommendations for Other Services      Precautions / Restrictions Precautions Precautions: Fall;Knee Precaution Booklet Issued: No Restrictions Weight Bearing Restrictions: Yes RLE Weight Bearing: Weight bearing as tolerated       Mobility Bed Mobility Overal bed mobility: Needs Assistance Bed Mobility: Supine to Sit     Supine to sit: Min assist Sit to supine: Supervision   General bed mobility comments: Pt reports that spouse typically assist with supine to sit transfer.   Transfers Overall transfer level: Needs assistance Equipment used: Rolling walker (2 wheeled) Transfers: Sit to/from Stand Sit to Stand: Min assist         General transfer comment: Cues for proper hand placement.     Balance Overall balance assessment: Needs assistance Sitting-balance support: Bilateral upper extremity supported;Feet supported Sitting balance-Leahy Scale: Fair     Standing balance support: No upper extremity supported;During functional  activity;Bilateral upper extremity supported Standing balance-Leahy Scale: Fair Standing balance comment: LOB posterior                           ADL either performed or assessed with clinical judgement   ADL Overall ADL's : Needs assistance/impaired     Grooming: Wash/dry hands;Min guard;Standing                 Lower Body Dressing Details (indicate cue type and reason): Pt and husband educated on technique to don/doff knee immobilizer and its wear schedule. Education also provided on compensatory techniques for LB dressing  Toilet Transfer: Minimal assistance;Ambulation;Comfort height toilet;RW Statistician Details (indicate cue type and reason): Pt able to complete toilet transfer with min assist for balance and support.  Toileting- Clothing Manipulation and Hygiene: Supervision/safety;Set up;Sitting/lateral lean     Tub/Shower Transfer Details (indicate cue type and reason): Pt advised to sponge bathe initially upon d/c home. Advised to review transfer with home health therapist.  Functional mobility during ADLs: Minimal assistance;Rolling walker. Pt unable to complete SLR. Recommend pt use a knee immobilizer during functional mobility at this time. Pt and husband verbalize understanding.        Vision       Perception     Praxis      Cognition Arousal/Alertness: Awake/alert Behavior During Therapy: WFL for tasks assessed/performed Overall Cognitive Status: Within Functional Limits for tasks assessed  Exercises     Shoulder Instructions       General Comments Pt's husband present during session and reports that he is able to provided assist to the pt as needed upon d/c home. Pt is eager to return home.     Pertinent Vitals/ Pain       Pain Assessment: 0-10 Pain Score: 7  Pain Location: R knee Pain Descriptors / Indicators: Grimacing;Sore;Operative site guarding Pain Intervention(s):  Monitored during session;Repositioned  Home Living                                          Prior Functioning/Environment              Frequency  Min 2X/week        Progress Toward Goals  OT Goals(current goals can now be found in the care plan section)     Acute Rehab OT Goals Patient Stated Goal: To go home  OT Goal Formulation: With patient/family Time For Goal Achievement: 06/25/17 Potential to Achieve Goals: Good ADL Goals Pt Will Perform Lower Body Bathing: with supervision;sit to/from stand Pt Will Perform Lower Body Dressing: with supervision;sit to/from stand Pt Will Perform Tub/Shower Transfer: with supervision;Tub transfer;ambulating;shower seat;rolling walker  Plan Discharge plan remains appropriate    Co-evaluation                 AM-PAC PT "6 Clicks" Daily Activity     Outcome Measure   Help from another person eating meals?: None Help from another person taking care of personal grooming?: A Little Help from another person toileting, which includes using toliet, bedpan, or urinal?: A Little Help from another person bathing (including washing, rinsing, drying)?: A Little Help from another person to put on and taking off regular upper body clothing?: A Little Help from another person to put on and taking off regular lower body clothing?: A Lot 6 Click Score: 18    End of Session Equipment Utilized During Treatment: Gait belt;Rolling walker;Right knee immobilizer CPM Right Knee Additional Comments: Knee ext in bone foam  OT Visit Diagnosis: Unsteadiness on feet (R26.81);Other abnormalities of gait and mobility (R26.89);Pain Pain - Right/Left: Right Pain - part of body: Knee   Activity Tolerance Patient tolerated treatment well   Patient Left in bed;with call bell/phone within reach;with family/visitor present   Nurse Communication Mobility status        Time: 4098-1191 OT Time Calculation (min): 38 min  Charges:     Cammy Copa, OTS [phone]    Cammy Copa 06/12/2017, 2:42 PM

## 2017-06-12 NOTE — Progress Notes (Addendum)
Physical Therapy Treatment Patient Details Name: Jade Walker MRN: [id number] DOB: 31-May-1946 Today's Date: 06/12/2017    History of Present Illness Patient is a 71 y/o female s/p R TKA on 06/10/17. PMH positive for arthritis, COPD, CKD III, DDD, HTN, HH, GERD.    PT Comments    Pt performed decreased activity and required significant assistance to achieve standing and ambulate to and from seated surface.  Pt also presenting with audible wheezing O2 sats on RA 95% and HR 70 bpm.  Pt required max encouragement to ambulate to bathroom and she preferred to stay in the bed and use the bed pan.  Pt educated on the benefits and importance of mobility in her recovery process.  Pt is not safe to d/c home and husband is concerned about her recovery at home.  Will inform supervising PT of need for change in recommendations based on patient presentation if patient doesn't improve this afternoon.  Plan for tx treatment this pm as patient is refusing placement.  Pt will need to negotiate stairs this pm.      Follow Up Recommendations  DC plan and follow up therapy as arranged by surgeon;Home health PT     Equipment Recommendations  None recommended by PT (owns DME)    Recommendations for Other Services       Precautions / Restrictions Precautions Precautions: Fall;Knee Precaution Booklet Issued: No Restrictions Weight Bearing Restrictions: Yes RLE Weight Bearing: Weight bearing as tolerated    Mobility  Bed Mobility Overal bed mobility: Needs Assistance Bed Mobility: Supine to Sit     Supine to sit: Mod assist     General bed mobility comments: Pt required significant assistance to elevate trunk and sit edge of bed.  Pt required use of chux pad to advance to the edge of the bed.  Pt presenting with increased weakness compared to yesterday.    Transfers Overall transfer level: Needs assistance Equipment used: Rolling walker (2 wheeled) Transfers: Sit to/from Stand Sit to Stand:  Mod assist (Pt attempted to stand and demonstrated LOB posterior required mod assist to correct and achieve standing.  )         General transfer comment: Pt performed sit to stand from bed and from commode.  Pt required cues for hand placement and heavy assistance to achieve standing.  Pt presents with LOB and requires significant assistance to achieve standing this am.    Ambulation/Gait Ambulation/Gait assistance: Min assist;Mod assist Ambulation Distance (Feet): 12 Feet (x2) Assistive device: Rolling walker (2 wheeled) Gait Pattern/deviations: Step-to pattern;Step-through pattern;Antalgic;Decreased weight shift to right;Trunk flexed;Decreased stride length Gait velocity: Decreased Gait velocity interpretation: Below normal speed for age/gender General Gait Details: Pt presents with poor safety awareness and patient not following commands to correct her unsafe behaviors.  Pt reaching for furniture in room and attempting to sit when she is not close to the chair.  Pt educated on posture, RW position and backing entirely to seated surface before sitting.     Stairs            Wheelchair Mobility    Modified Rankin (Stroke Patients Only)       Balance Overall balance assessment: Needs assistance   Sitting balance-Leahy Scale: Fair       Standing balance-Leahy Scale: Poor Standing balance comment: LOB posterior                            Cognition Arousal/Alertness: Awake/alert Behavior During  Therapy: WFL for tasks assessed/performed Overall Cognitive Status: Within Functional Limits for tasks assessed                                        Exercises      General Comments        Pertinent Vitals/Pain Pain Assessment: 0-10 Pain Score: 9  Pain Location: R knee Pain Descriptors / Indicators: Grimacing;Sore;Operative site guarding Pain Intervention(s): Monitored during session;Repositioned;Ice applied    Home Living                       Prior Function            PT Goals (current goals can now be found in the care plan section) Acute Rehab PT Goals Patient Stated Goal: to get better and go home Potential to Achieve Goals: Good Progress towards PT goals: Progressing toward goals    Frequency    7X/week      PT Plan Current plan remains appropriate    Co-evaluation              AM-PAC PT "6 Clicks" Daily Activity  Outcome Measure  Difficulty turning over in bed (including adjusting bedclothes, sheets and blankets)?: Unable Difficulty moving from lying on back to sitting on the side of the bed? : Unable Difficulty sitting down on and standing up from a chair with arms (e.g., wheelchair, bedside commode, etc,.)?: Unable Help needed moving to and from a bed to chair (including a wheelchair)?: A Lot Help needed walking in hospital room?: A Lot Help needed climbing 3-5 steps with a railing? : A Little 6 Click Score: 10    End of Session Equipment Utilized During Treatment: Gait belt Activity Tolerance: Patient limited by fatigue;Patient limited by pain Patient left: in chair;with call bell/phone within reach;with family/visitor present;with nursing/sitter in room Nurse Communication: Mobility status PT Visit Diagnosis: Difficulty in walking, not elsewhere classified (R26.2);Pain Pain - Right/Left: Right Pain - part of body: Knee     Time: 1610-9604 PT Time Calculation (min) (ACUTE ONLY): 28 min  Charges:  $Gait Training: 8-22 mins $Therapeutic Activity: 8-22 mins                    G Codes:       Joycelyn Rua, PTA pager [phone]    Florestine Avers 06/12/2017, 11:42 AM

## 2017-06-12 NOTE — Therapy (Signed)
OT Cancellation Note  Patient Details Name: Jade Walker MRN: [id number] DOB: [birth year]   Cancelled Treatment:    Reason Eval/Treat Not Completed: Medical issues which prohibited therapy (BP 70/44). Will return later.   [address]   [place], [place] #[phone]  06/12/2017, 9:39 AM

## 2017-06-12 NOTE — Care Management (Signed)
Patient says she has given away her RW and 3in1 and can not remember to whom. CM has ordered RW and 3in1 from [facility] .

## 2017-06-12 NOTE — Progress Notes (Signed)
OT Note - Addendum    06/12/17 1500  OT Visit Information  Last OT Received On 06/12/17  OT Time Calculation  OT Start Time (ACUTE ONLY) 1244  OT Stop Time (ACUTE ONLY) 1322  OT Time Calculation (min) 38 min  OT Treatments  $Self Care/Home Management  38-52 mins  [facility], OT/L  [phone] 06/12/2017

## 2017-06-12 NOTE — Progress Notes (Signed)
Physical Therapy Treatment Patient Details Name: Jade Walker MRN: [id number] DOB: 10-06-1945 Today's Date: 06/12/2017    History of Present Illness Patient is a 71 y/o female s/p R TKA on 06/10/17. PMH positive for arthritis, COPD, CKD III, DDD, HTN, HH, GERD.    PT Comments    Pt performed increased gait and reviewed stair training in prep for d/c home today.  Pt significantly improved from tx this am and is safe to d/c home. Informed supervising PT and RN of patient progress.  Pt will however require RW and 3:1 commode at d/c as she reports she does not have access to these items.     Follow Up Recommendations  DC plan and follow up therapy as arranged by surgeon;Home health PT     Equipment Recommendations  Rolling walker with 5" wheels;3in1 (PT) (Pt reports she does not have access to equipment this afternoon and will need these items before d/c home.  )    Recommendations for Other Services       Precautions / Restrictions Precautions Precautions: Fall;Knee Precaution Booklet Issued: No Restrictions Weight Bearing Restrictions: Yes RLE Weight Bearing: Weight bearing as tolerated    Mobility  Bed Mobility Overal bed mobility: Needs Assistance Bed Mobility: Supine to Sit     Supine to sit: Min guard     General bed mobility comments: Pt presents with improved ability and able to self assist her R LE to edge of bed.  Pt more alert and breathing improved.    Transfers Overall transfer level: Needs assistance Equipment used: Rolling walker (2 wheeled) Transfers: Sit to/from Stand Sit to Stand: Supervision         General transfer comment: Cues for hand placement to and from seated surface.  Pt with improved eccentric loading.    Ambulation/Gait Ambulation/Gait assistance: Min guard Ambulation Distance (Feet): 80 Feet Assistive device: Rolling walker (2 wheeled) Gait Pattern/deviations: Step-through pattern;Antalgic;Decreased weight shift to right;Trunk  flexed;Decreased stride length Gait velocity: Decreased Gait velocity interpretation: Below normal speed for age/gender General Gait Details: Pt with improved safety and following commands better this afternoon.  KI in placed and patient reports she feels more supported with application of brace.     Stairs Stairs: Yes   Stair Management: Two rails;Step to pattern;Forwards Number of Stairs: 6 General stair comments: Cues for sequencing and hand placement on railings.  MIn guard for safety.  Husband present to observe stair training.    Wheelchair Mobility    Modified Rankin (Stroke Patients Only)       Balance Overall balance assessment: Needs assistance   Sitting balance-Leahy Scale: Fair     Standing balance support: No upper extremity supported;During functional activity;Bilateral upper extremity supported Standing balance-Leahy Scale: Fair Standing balance comment: LOB posterior                            Cognition Arousal/Alertness: Awake/alert Behavior During Therapy: WFL for tasks assessed/performed Overall Cognitive Status: Within Functional Limits for tasks assessed                                        Exercises Total Joint Exercises Ankle Circles/Pumps: AROM;Both;10 reps;Supine Quad Sets: AROM;Right;10 reps;Supine Towel Squeeze: AROM;Both;10 reps;Supine Short Arc Quad: AROM;Right;10 reps;Supine Heel Slides: AAROM;Right;Supine;10 reps Hip ABduction/ADduction: AROM;Right;Supine;10 reps Straight Leg Raises: AROM;AAROM;Right;Supine;10 reps Goniometric ROM: Grossly 0-65 degrees flexion  in R knee.      General Comments        Pertinent Vitals/Pain Pain Assessment: 0-10 Pain Score: 9  Pain Location: R knee Pain Descriptors / Indicators: Grimacing;Sore;Operative site guarding Pain Intervention(s): Monitored during session;Repositioned;Ice applied    Home Living                      Prior Function            PT  Goals (current goals can now be found in the care plan section) Acute Rehab PT Goals Patient Stated Goal: to get better and go home Potential to Achieve Goals: Good Progress towards PT goals: Progressing toward goals    Frequency    7X/week      PT Plan Current plan remains appropriate    Co-evaluation              AM-PAC PT "6 Clicks" Daily Activity  Outcome Measure  Difficulty turning over in bed (including adjusting bedclothes, sheets and blankets)?: A Little Difficulty moving from lying on back to sitting on the side of the bed? : A Little Difficulty sitting down on and standing up from a chair with arms (e.g., wheelchair, bedside commode, etc,.)?: A Little Help needed moving to and from a bed to chair (including a wheelchair)?: A Little Help needed walking in hospital room?: A Little Help needed climbing 3-5 steps with a railing? : A Little 6 Click Score: 18    End of Session Equipment Utilized During Treatment: Gait belt Activity Tolerance: Patient tolerated treatment well Patient left: in chair;with call bell/phone within reach;with family/visitor present;with nursing/sitter in room Nurse Communication: Mobility status PT Visit Diagnosis: Difficulty in walking, not elsewhere classified (R26.2);Pain Pain - Right/Left: Right Pain - part of body: Knee     Time: 1610-9604 PT Time Calculation (min) (ACUTE ONLY): 27 min  Charges:  $Gait Training: 8-22 mins $Therapeutic Exercise: 8-22 mins                    G Codes:       Jade Walker, PTA pager [phone]    Jade Walker 06/12/2017, 2:17 PM

## 2017-06-12 NOTE — Progress Notes (Signed)
Pt discharged to home in stable condition, all discharge instructions and Rx's x 1 given to pt, all belongings taken. Pt transported via wheelchair, with transporters x1.  AKingRNBSN

## 2017-06-13 ENCOUNTER — Telehealth (INDEPENDENT_AMBULATORY_CARE_PROVIDER_SITE_OTHER): Payer: Self-pay | Admitting: Orthopedic Surgery

## 2017-06-13 NOTE — Telephone Encounter (Signed)
Spoke with Youlanda Mighty from [facility] needing verbal orders for Home Health (PT) 1 wk 1, 3 wk 1 and 2 wk 1 The number to contact Abram is [phone]

## 2017-06-13 NOTE — Telephone Encounter (Signed)
IC verbal given.  

## 2017-06-23 ENCOUNTER — Inpatient Hospital Stay (INDEPENDENT_AMBULATORY_CARE_PROVIDER_SITE_OTHER): Payer: Medicare HMO | Admitting: Orthopedic Surgery

## 2017-06-25 NOTE — Discharge Summary (Signed)
Physician Discharge Summary  Patient ID: Jade Walker MRN: [id number] DOB/AGE: [birth year] 72 y.o.  Admit date: 06/10/2017 Discharge date: 06/12/2017  Admission Diagnoses:  Active Problems:   Arthritis of knee   Discharge Diagnoses:  Same  Surgeries: Procedure(s): RIGHT TOTAL KNEE ARTHROPLASTY on 06/10/2017   Consultants:   Discharged Condition: Stable  Hospital Course: Jade Walker is an 71 y.o. female who was admitted 06/10/2017 with a chief complaint of right knee pain, and found to have a diagnosis of right knee arthritis.  They were brought to the operating room on 06/10/2017 and underwent the above named procedures. Patient tolerated the procedure well and was transferred to recovery room in stable condition.  She started with physical therapy and the CPM machine  .she was mobilizing very well by postop day #2 and was discharged home at that time   Antibiotics given:  Anti-infectives    Start     Dose/Rate Route Frequency Ordered Stop   06/10/17 2330  vancomycin (VANCOCIN) IVPB 1000 mg/200 mL premix     1,000 mg 200 mL/hr over 60 Minutes Intravenous Every 12 hours 06/10/17 1702 06/11/17 0030   06/10/17 1145  vancomycin (VANCOCIN) IVPB 1000 mg/200 mL premix     1,000 mg 200 mL/hr over 60 Minutes Intravenous  Once 06/10/17 1132 06/10/17 1248   06/10/17 1125  ceFAZolin (ANCEF) 2-4 GM/100ML-% IVPB  Status:  Discontinued    Comments:  Rosenberger, Meredit: cabinet override      06/10/17 1125 06/10/17 1136   06/10/17 1122  ceFAZolin (ANCEF) IVPB 2g/100 mL premix  Status:  Discontinued     2 g 200 mL/hr over 30 Minutes Intravenous On call to O.R. 06/10/17 1122 06/10/17 1132    .  Recent vital signs:  Vitals:   06/12/17 1016 06/12/17 1436  BP: (!) 98/46 (!) 113/56  Pulse:  69  Resp:  16  Temp:  98.1 F (36.7 C)  SpO2:  96%    Recent laboratory studies:  Results for orders placed or performed during the hospital encounter of 06/02/17  Surgical pcr screen   Result Value Ref Range   MRSA, PCR POSITIVE (A) NEGATIVE   Staphylococcus aureus POSITIVE (A) NEGATIVE  Urine culture  Result Value Ref Range   Specimen Description URINE, CLEAN CATCH    Special Requests NONE    Culture MULTIPLE SPECIES PRESENT, SUGGEST RECOLLECTION (A)    Report Status 06/03/2017 FINAL   Basic metabolic panel  Result Value Ref Range   Sodium 139 135 - 145 mmol/L   Potassium 4.3 3.5 - 5.1 mmol/L   Chloride 104 101 - 111 mmol/L   CO2 29 22 - 32 mmol/L   Glucose, Bld 95 65 - 99 mg/dL   BUN 9 6 - 20 mg/dL   Creatinine, Ser 4.09 (H) 0.44 - 1.00 mg/dL   Calcium 9.4 8.9 - 81.1 mg/dL   GFR calc non Af Amer 24 (L) >60 mL/min   GFR calc Af Amer 27 (L) >60 mL/min   Anion gap 6 5 - 15  CBC  Result Value Ref Range   WBC 6.9 4.0 - 10.5 K/uL   RBC 4.57 3.87 - 5.11 MIL/uL   Hemoglobin 13.5 12.0 - 15.0 g/dL   HCT 91.4 78.2 - 95.6 %   MCV 93.9 78.0 - 100.0 fL   MCH 29.5 26.0 - 34.0 pg   MCHC 31.5 30.0 - 36.0 g/dL   RDW 21.3 08.6 - 57.8 %   Platelets 278 150 - 400 K/uL  Urinalysis, Complete w Microscopic  Result Value Ref Range   Color, Urine YELLOW YELLOW   APPearance CLEAR CLEAR   Specific Gravity, Urine 1.009 1.005 - 1.030   pH 6.0 5.0 - 8.0   Glucose, UA NEGATIVE NEGATIVE mg/dL   Hgb urine dipstick MODERATE (A) NEGATIVE   Bilirubin Urine NEGATIVE NEGATIVE   Ketones, ur NEGATIVE NEGATIVE mg/dL   Protein, ur NEGATIVE NEGATIVE mg/dL   Nitrite NEGATIVE NEGATIVE   Leukocytes, UA NEGATIVE NEGATIVE   RBC / HPF 6-30 0 - 5 RBC/hpf   WBC, UA 0-5 0 - 5 WBC/hpf   Bacteria, UA RARE (A) NONE SEEN   Squamous Epithelial / LPF 0-5 (A) NONE SEEN   Mucus PRESENT    Hyaline Casts, UA PRESENT     Discharge Medications:   Allergies as of 06/12/2017      Reactions   Codeine Rash, Other (See Comments)   SWEATING "HEART FLUTTERING"   Sulfamethoxazole-trimethoprim Rash      Medication List    TAKE these medications   albuterol (2.5 MG/3ML) 0.083% nebulizer  solution Commonly known as:  PROVENTIL Take 2.5 mg by nebulization every 6 (six) hours as needed for wheezing or shortness of breath. i puff once a day as needed   albuterol 108 (90 Base) MCG/ACT inhaler Commonly known as:  PROVENTIL HFA;VENTOLIN HFA Inhale 1-2 puffs into the lungs every 6 (six) hours as needed for wheezing or shortness of breath.   aspirin 325 MG EC tablet Take 1 tablet (325 mg total) by mouth daily.   cetirizine 10 MG tablet Commonly known as:  ZYRTEC Take 10 mg by mouth daily.   citalopram 10 MG tablet Commonly known as:  CELEXA Take 10 mg by mouth daily.   famotidine 40 MG tablet Commonly known as:  PEPCID Take 40 mg by mouth 2 (two) times daily.   ferrous sulfate 325 (65 FE) MG EC tablet Take 325 mg by mouth daily with breakfast.   levothyroxine 50 MCG tablet Commonly known as:  SYNTHROID, LEVOTHROID Take 50 mcg by mouth daily before breakfast.   losartan 50 MG tablet Commonly known as:  COZAAR Take 50 mg by mouth daily.   montelukast 10 MG tablet Commonly known as:  SINGULAIR Take 10 mg by mouth at bedtime.   oxyCODONE 5 MG immediate release tablet Commonly known as:  Oxy IR/ROXICODONE Take 1-2 tablets (5-10 mg total) by mouth every 3 (three) hours as needed for breakthrough pain. What changed:  when to take this  reasons to take this   oxyCODONE-acetaminophen 5-325 MG tablet Commonly known as:  ROXICET Take 1-2 tablets by mouth every 4 (four) hours as needed for severe pain.   tiZANidine 4 MG tablet Commonly known as:  ZANAFLEX Take 1 tablet (4 mg total) by mouth every 6 (six) hours as needed for muscle spasms. What changed:  when to take this   Vitamin D (Ergocalciferol) 50000 units Caps capsule Commonly known as:  DRISDOL Take 50,000 Units by mouth every Monday.       Diagnostic Studies: No results found.  Disposition: 01-Home or Self Care  Discharge Instructions    Call MD / Call 911    Complete by:  As directed    If  you experience chest pain or shortness of breath, CALL 911 and be transported to the hospital emergency room.  If you develope a fever above 101 F, pus (white drainage) or increased drainage or redness at the wound, or calf pain, call your surgeon's  office.   Constipation Prevention    Complete by:  As directed    Drink plenty of fluids.  Prune juice may be helpful.  You may use a stool softener, such as Colace (over the counter) 100 mg twice a day.  Use MiraLax (over the counter) for constipation as needed.   Diet - low sodium heart healthy    Complete by:  As directed    Discharge instructions    Complete by:  As directed    cpm 1-1-1/2 hours 3 times a day Okay to weight-bear as tolerated Okay to shower dressing is waterproof Remove dressing on Wednesday of next week and okay to shower at that time as well.  No submersion of the leg and water   Increase activity slowly as tolerated    Complete by:  As directed       Follow-up Information    [facility]Home Follow up.   Why:  A representative from [facility] will contact you to arrange start date and time for your therapy. Contact information: [address] [place] [id number] [phone]            Signed: Burnard BuntingG Scott Dean 06/25/2017, 5:41 PM

## 2017-06-26 ENCOUNTER — Ambulatory Visit (INDEPENDENT_AMBULATORY_CARE_PROVIDER_SITE_OTHER): Payer: Medicare HMO

## 2017-06-26 ENCOUNTER — Ambulatory Visit (INDEPENDENT_AMBULATORY_CARE_PROVIDER_SITE_OTHER): Payer: Medicare HMO | Admitting: Orthopedic Surgery

## 2017-06-26 ENCOUNTER — Encounter (INDEPENDENT_AMBULATORY_CARE_PROVIDER_SITE_OTHER): Payer: Self-pay | Admitting: Orthopedic Surgery

## 2017-06-26 DIAGNOSIS — Z96651 Presence of right artificial knee joint: Secondary | ICD-10-CM

## 2017-06-26 DIAGNOSIS — M545 Low back pain: Secondary | ICD-10-CM

## 2017-06-26 MED ORDER — OXYCODONE HCL 5 MG PO TABS: 5.0000 mg | ORAL_TABLET | Freq: Three times a day (TID) | ORAL | 0 refills | Status: DC | PRN

## 2017-06-27 ENCOUNTER — Telehealth (INDEPENDENT_AMBULATORY_CARE_PROVIDER_SITE_OTHER): Payer: Self-pay | Admitting: Orthopedic Surgery

## 2017-06-27 NOTE — Telephone Encounter (Signed)
Thayer OhmChris from [facility] called requesting VO for the patient [facility]  2[facility] for 1 week.   CB#[phone].  Thank you.

## 2017-06-27 NOTE — Telephone Encounter (Signed)
IC advised.  

## 2017-06-28 NOTE — Progress Notes (Signed)
Post-Op Visit Note   Patient: Jade Walker           Date of Birth: 1945/12/02           MRN: [id number] Visit Date: 06/26/2017 PCP: Jade Walker, Jade M, PA   Assessment & Plan:  Chief Complaint:  Chief Complaint  Patient presents with  . Right Knee - Routine Post Op   Visit Diagnoses:  1. Presence of right artificial knee joint   2. Low back pain, unspecified back pain laterality, unspecified chronicity, with sciatica presence unspecified     Plan: Jade DroneBrenda is a 71 year old patient 2 weeks outright total knee replacement.  She has been doing well.  On exam she has excellent range of motion and full extension. Plan is to refill pain medi, refer to Dr. Alvester Walker for cervical spine ESI, see her back in 4 weeks for recheck.  Follow-Up Instructions: Return in about 4 weeks (around 07/24/2017).   Orders:  Orders Placed This Encounter  Procedures  . XR Knee 1-2 Views Right  . Ambulatory referral to Physical Medicine Rehab   Meds ordered this encounter  Medications  . oxyCODONE (OXY IR/ROXICODONE) 5 MG immediate release tablet    Sig: Take 1 tablet (5 mg total) by mouth every 8 (eight) hours as needed for breakthrough pain.    Dispense:  60 tablet    Refill:  0    Imaging: No results found.  PMFS History: Patient Active Problem List   Diagnosis Date Noted  . Arthritis of knee 06/10/2017  . 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 S/P shoulder replacement 10/06/2015  . Degenerative arthritis of knee 04/18/2015   Past Medical History:  Diagnosis Date  . Anxiety   . 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 Asthma   . CKD (chronic kidney disease), stage III (HCC)   . Complication of anesthesia    blood loss after back surgery because hernia was scratched and "torn" during intubation (02/15/11 EGD showed GIB d/t esophageal ulcer)  . COPD (chronic obstructive pulmonary disease) (HCC)   . Environmental and seasonal allergies   . Full dentures     . GERD (gastroesophageal reflux disease)   . Headache    sinus  . History of hiatal hernia   . History of kidney stones   . History of stress test 2012   pt. reports it was resulted normal-HPR  . Hypertension   . Hypothyroidism   . Iron deficiency anemia    from Care Everywhere  . Osteoporosis   . Pneumonia   . Shortness of breath dyspnea    climbing steps  . Stress incontinence    from Care Everywhere    Family History  Problem Relation Age of Onset  . CAD Mother   . Emphysema Father   . Alcoholism Father     Past Surgical History:  Procedure Laterality Date  . ABDOMINAL HYSTERECTOMY    . BACK SURGERY     lumbar x 3, 1 thoracic spine  . CHOLECYSTECTOMY    . COLONOSCOPY    . EYE SURGERY Bilateral    cataract surgery with lens implant  . FRACTURE SURGERY    . HERNIA REPAIR     hiatal hernia "removed"- HPR, 1990  . JOINT REPLACEMENT Left 04/2105   knee  . OPEN REDUCTION INTERNAL FIXATION (ORIF) DISTAL RADIAL FRACTURE Left 03/14/2015   Procedure: OPEN REDUCTION INTERNAL FIXATION (ORIF) LEFT DISTAL RADIUS FRACTURE;  Surgeon: Cammy CopaScott Braylyn Kalter,  MD;  Location: [facility] OR;  Service: Orthopedics;  Laterality: Left;  . TONSILLECTOMY    . TOTAL KNEE ARTHROPLASTY Left 04/18/2015   Procedure: TOTAL KNEE ARTHROPLASTY;  Surgeon: Cammy Copa, MD;  Location: [facility] OR;  Service: Orthopedics;  Laterality: Left;  . TOTAL KNEE ARTHROPLASTY Right 06/10/2017  . TOTAL KNEE ARTHROPLASTY Right 06/10/2017   Procedure: RIGHT TOTAL KNEE ARTHROPLASTY;  Surgeon: Cammy Copa, MD;  Location: [facility] OR;  Service: Orthopedics;  Laterality: Right;  . TOTAL SHOULDER ARTHROPLASTY Right 10/06/2015   Procedure: RIGHT TOTAL SHOULDER ARTHROPLASTY;  Surgeon: Beverely Low, MD;  Location: [facility] OR;  Service: Orthopedics;  Laterality: Right;  . TOTAL SHOULDER ARTHROPLASTY Left 11/22/2016   Procedure: TOTAL SHOULDER ARTHROPLASTY;  Surgeon: Beverely Low, MD;  Location: [facility] OR;  Service: Orthopedics;  Laterality: Left;    Social History   Occupational History  . Not on file.   Social History Main Topics  . Smoking status: Never Smoker  . Smokeless tobacco: Never Used  . Alcohol use No  . Drug use: No  . Sexual activity: Not on file

## 2017-07-10 ENCOUNTER — Institutional Professional Consult (permissible substitution) (INDEPENDENT_AMBULATORY_CARE_PROVIDER_SITE_OTHER): Payer: Medicare HMO | Admitting: Physical Medicine and Rehabilitation

## 2017-07-21 ENCOUNTER — Telehealth (INDEPENDENT_AMBULATORY_CARE_PROVIDER_SITE_OTHER): Payer: Self-pay | Admitting: Orthopedic Surgery

## 2017-07-21 ENCOUNTER — Telehealth (INDEPENDENT_AMBULATORY_CARE_PROVIDER_SITE_OTHER): Payer: Self-pay | Admitting: Physical Medicine and Rehabilitation

## 2017-07-21 MED ORDER — OXYCODONE HCL 5 MG PO TABS: ORAL_TABLET | ORAL | 0 refills | Status: DC

## 2017-07-21 NOTE — Telephone Encounter (Signed)
IC advised could pick up at front desk to take to pharmacy.  

## 2017-07-21 NOTE — Telephone Encounter (Signed)
Rx refill Oxycodone °

## 2017-07-21 NOTE — Telephone Encounter (Signed)
y

## 2017-07-21 NOTE — Telephone Encounter (Signed)
Patient needs to schedule the consult she cancelled on 11/1. Call back number is [phone].

## 2017-07-21 NOTE — Telephone Encounter (Signed)
Ok to rf? 

## 2017-07-22 NOTE — Telephone Encounter (Signed)
Patient called back to reschedule. Please call back when possible [phone]

## 2017-07-22 NOTE — Telephone Encounter (Signed)
Called patient back and left message for her to call to reschedule.

## 2017-07-24 NOTE — Telephone Encounter (Signed)
Called patient and rescheduled.

## 2017-07-28 ENCOUNTER — Ambulatory Visit (INDEPENDENT_AMBULATORY_CARE_PROVIDER_SITE_OTHER): Payer: Medicare HMO | Admitting: Orthopedic Surgery

## 2017-07-28 ENCOUNTER — Encounter (INDEPENDENT_AMBULATORY_CARE_PROVIDER_SITE_OTHER): Payer: Self-pay | Admitting: Orthopedic Surgery

## 2017-07-28 DIAGNOSIS — Z96651 Presence of right artificial knee joint: Secondary | ICD-10-CM

## 2017-07-28 MED ORDER — OXYCODONE HCL 5 MG PO TABS: ORAL_TABLET | ORAL | 0 refills | Status: DC

## 2017-07-28 NOTE — Progress Notes (Signed)
Post-Op Visit Note   Patient: Jade Walker           Date of Birth: 29-Mar-1946           MRN: [id number] Visit Date: 07/28/2017 PCP: Jade Walker   Assessment & Plan:  Chief Complaint:  Chief Complaint  Patient presents with  . Right Knee - Follow-up   Visit Diagnoses:  1. Presence of right artificial knee joint     Plan: Jade Walker is a 71 year old patient who is now 6 weeks out right total knee replacement.  She's having some pain but in general she's got in the knee moving.  On exam she has range of motion 0-110 of flexion.  No effusion in the knee.  [facility] refill her oxycodone and have her come back in 6 weeks for clinical recheck.  Follow-Up Instructions: Return in about 6 weeks (around 09/08/2017).   Orders:  No orders of the defined types were placed in this encounter.  Meds ordered this encounter  Medications  . oxyCODONE (OXY IR/ROXICODONE) 5 MG immediate release tablet    Sig: 1 po q 8 hrs prn pain    Dispense:  60 tablet    Refill:  0    Imaging: No results found.  PMFS History: Patient Active Problem List   Diagnosis Date Noted  . Arthritis of knee 06/10/2017  . 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 S/P shoulder replacement 10/06/2015  . Degenerative arthritis of knee 04/18/2015   Past Medical History:  Diagnosis Date  . Anxiety   . 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 Asthma   . CKD (chronic kidney disease), stage III (HCC)   . Complication of anesthesia    blood loss after back surgery because hernia was scratched and "torn" during intubation (02/15/11 EGD showed GIB d/t esophageal ulcer)  . COPD (chronic obstructive pulmonary disease) (HCC)   . Environmental and seasonal allergies   . Full dentures   . GERD (gastroesophageal reflux disease)   . Headache    sinus  . History of hiatal hernia   . History of kidney stones   . History of stress test 2012   pt. reports it was resulted normal-HPR  .  Hypertension   . Hypothyroidism   . Iron deficiency anemia    from Care Everywhere  . Osteoporosis   . Pneumonia   . Shortness of breath dyspnea    climbing steps  . Stress incontinence    from Care Everywhere    Family History  Problem Relation Age of Onset  . CAD Mother   . Emphysema Father   . Alcoholism Father     Past Surgical History:  Procedure Laterality Date  . ABDOMINAL HYSTERECTOMY    . BACK SURGERY     lumbar x 3, 1 thoracic spine  . CHOLECYSTECTOMY    . COLONOSCOPY    . EYE SURGERY Bilateral    cataract surgery with lens implant  . FRACTURE SURGERY    . HERNIA REPAIR     hiatal hernia "removed"- HPR, 1990  . JOINT REPLACEMENT Left 04/2105   knee  . OPEN REDUCTION INTERNAL FIXATION (ORIF) LEFT DISTAL RADIUS FRACTURE Left 03/14/2015   [facility]  . RIGHT TOTAL KNEE ARTHROPLASTY Right 06/10/2017   [facility]aean[facility] Gregory [facility]  . RIGHT TOTAL SHOULDER ARTHROPLASTY Right 10/06/2015   [facility] LowNorris, Steve[facility]  [facility]  . TONSILLECTOMY    . TOTAL KNEE ARTHROPLASTY Right 06/10/2017  . TOTAL KNEE ARTHROPLASTY Left 04/18/2015   [facility]an[facility]  . TOTAL SHOULDER ARTHROPLASTY Left 11/22/2016   [facility]Norris[facility] Steve[facility]   Social History   Occupational History  . Not on file  Tobacco Use  . Smoking status: Never Smoker  . Smokeless tobacco: Never Used  Substance and Sexual Activity  . Alcohol use: No  . Drug use: No  . Sexual activity: Not on file

## 2017-07-29 ENCOUNTER — Encounter (INDEPENDENT_AMBULATORY_CARE_PROVIDER_SITE_OTHER): Payer: Self-pay | Admitting: Physical Medicine and Rehabilitation

## 2017-07-29 ENCOUNTER — Telehealth (INDEPENDENT_AMBULATORY_CARE_PROVIDER_SITE_OTHER): Payer: Self-pay | Admitting: Physical Medicine and Rehabilitation

## 2017-07-29 ENCOUNTER — Ambulatory Visit (INDEPENDENT_AMBULATORY_CARE_PROVIDER_SITE_OTHER): Payer: Medicare HMO | Admitting: Physical Medicine and Rehabilitation

## 2017-07-29 VITALS — BP 177/94 | HR 68

## 2017-07-29 DIAGNOSIS — G8929 Other chronic pain: Secondary | ICD-10-CM | POA: Diagnosis not present

## 2017-07-29 DIAGNOSIS — M961 Postlaminectomy syndrome, not elsewhere classified: Secondary | ICD-10-CM

## 2017-07-29 DIAGNOSIS — M545 Low back pain: Secondary | ICD-10-CM

## 2017-07-29 DIAGNOSIS — M47816 Spondylosis without myelopathy or radiculopathy, lumbar region: Secondary | ICD-10-CM

## 2017-07-29 NOTE — Progress Notes (Deleted)
Lower back pain- both sides, but right is worse than left. Can't tell about leg pain- recently had knee surgery. No numbness or tingling. Standing, bending hurt worse. Sometimes sitting hurts. Had back surgery about 13 years ago. Jade Walker 2012

## 2017-07-30 ENCOUNTER — Encounter (INDEPENDENT_AMBULATORY_CARE_PROVIDER_SITE_OTHER): Payer: Self-pay | Admitting: Physical Medicine and Rehabilitation

## 2017-07-30 NOTE — Telephone Encounter (Signed)
Submitted auth request on [employer]

## 2017-07-30 NOTE — Progress Notes (Signed)
Jade Walker - 71 y.o. female MRN [id number]  Date of birth: 06/30/1946  Office Visit Note: Visit Date: 07/29/2017 PCP: [name], [name], PA Referred by: [name], [name], PA  Subjective: Chief Complaint  Patient presents with  . Lower Back - Pain   HPI: Jade Walker is a 71 year old referred here by Dr. August Saucerean on 06/10/2017 completed a right total knee arthroplasty.  Jade Walker has a chronic history of back pain with lumbar laminectomy  by Dr. Phoebe PerchHirsch.  In fact, it appears that she had initial surgery at then in 2012 had severe stenosis at L3-4 and had decompression and fusion at this level with removal of some of the hardware at L4-5.  There is to that she has had a laminectomy at L5-S1 but without fusion.  She has had multiple orthopedic issues over the last several years with right total shoulder replacement in 2017 left total knee replacement in 2016.  To take oxycodone post surgery and this is not really helping her back very much.  In terms of her back pain she reports across both sides worse with standing but can also be worse with sitting.  When she tries to go from standing to sitting it seems to hurt with bending that direction not necessarily bending forward.  She denies any numbness tingling or paresthesias down reports that she cannot really tell about her leg pain because of the knee surgery.  She has not had recent physical therapy for her lower back.  Is worse not being able to get down to the floor to do any exercises.  He does try to stay active around the house and doing yard work and in fact she reports that she probably does too much.  Her husband who is here and provides some of the history agrees that she really is a person who is nonstop.  Dr. August Saucerean did obtain a new MRI of the lumbar spine and this is reviewed below and basically shows new areas of small listhesis and facet arthropathy.  Most significantly below the fusion levels there is arthropathy at L L5-S1 there is new  listhesis here as well.    Review of Systems  Constitutional: Negative for chills, fever, malaise/fatigue and weight loss.  HENT: Negative for hearing loss and sinus pain.   Eyes: Negative for blurred vision, double vision and photophobia.  Respiratory: Negative for cough and shortness of breath.   Cardiovascular: Negative for chest pain, palpitations and leg swelling.  Gastrointestinal: Negative for abdominal pain, nausea and vomiting.  Genitourinary: Negative for flank pain.  Musculoskeletal: Positive for back pain and joint pain. Negative for myalgias.  Skin: Negative for itching and rash.  Neurological: Negative for tremors, focal weakness and weakness.  Endo/Heme/Allergies: Negative.   Psychiatric/Behavioral: Negative for depression.  All other systems reviewed and are negative.  Otherwise per HPI.  Assessment & Plan: Visit Diagnoses:  1. Spondylosis without myelopathy or radiculopathy, lumbar region   2. Chronic bilateral low back pain without sciatica   3. Post laminectomy syndrome     Plan: Findings:  Chronic low back pain status post at least 3 prior surgeries with fusion at L3-4 and L4-5 and laminectomy at L5-S1.  Last surgery was in 2012.  These were performed by Dr. Phoebe PerchHirsch.  She has not had any procedures of the lumbar spine since that time.  He is to have mostly axial pain which I think is mostly facet mediated pain below the fusion.  The MRI does not show  any new levels of stenosis.  She does have some worsening spondylosis in the upper lumbar spine but does not complain of pain in that area.  She continues to take oxycodone post surgery from her knee.  Dr. August Saucer is managing this.  She has been off and on pain medications for different orthopedic complaints for a few years.  She has had multiple joint replacement.  I think the best approach is diagnostic medial branch blocks at L5-S1 and just see if she gets good relief temporarily we could look at radiofrequency ablation.  If  that does not seem to help we could look at a one-time epidural injection.  Alternatively she may be a spinal cord stimulator although I am not sure she would be interested in that but it is something to look at from an alternative standpoint.    Meds & Orders: No orders of the defined types were placed in this encounter.  No orders of the defined types were placed in this encounter.   Follow-up: Return for Bilateral medial branch facet joint blocks at L5-S1.Marland Kitchen   Procedures: No procedures performed  No notes on file   Clinical History: MRI LUMBAR SPINE WITHOUT CONTRAST  TECHNIQUE: Multiplanar, multisequence MR imaging of the lumbar spine was performed. No intravenous contrast was administered.  COMPARISON:  07/19/2010  FINDINGS: Segmentation:  Standard.  Alignment: Exaggerated lumbar lordosis. Increased retrolisthesis of L1 on L2 measuring 4 mm. New trace retrolisthesis of L2 on L3. Unchanged 5 mm anterolisthesis of L4 on L5. New 3 mm anterolisthesis of L5 on S1.  Vertebrae: No fracture, suspicious osseous lesion, or significant marrow edema. New small Schmorl's nodes at T11-12.  Conus medullaris: Extends to the L1-2 level and appears normal.  Paraspinal and other soft tissues: Postoperative changes in the posterior lower lumbar soft tissues with small fluid collection in the laminectomy bed at L4-5. Lower lumbar paraspinal muscle atrophy bilaterally.  Disc levels:  Disc desiccation throughout the lumbar spine.  T10-11: Unchanged mild disc degeneration. No disc herniation or stenosis. Mild facet arthrosis.  T11-12: Mildly progressive disc degeneration. No disc herniation or stenosis.  T12-L1: Minimal disc bulging and slight facet hypertrophy without stenosis, unchanged.  L1-2: Increased disc space narrowing and retrolisthesis. Disc bulging and mild-to-moderate facet and ligamentum flavum hypertrophy without significant stenosis.  L2-3: Moderate  facet and ligamentum flavum hypertrophy and minimal disc bulging result in new mild spinal stenosis without neural foraminal stenosis.  L3-4:  Interval posterior decompression and fusion.  No stenosis.  L4-5: Prior posterior decompression and fusion. Interval removal of L5 pedicle screws. Listhesis with disc uncovering. No stenosis.  L5-S1: Prior posterior decompression. New anterolisthesis with mild disc uncovering and a a new small left foraminal disc extrusion resulting in mild left neural foraminal stenosis and potential left L5 nerve root impingement. No spinal or right neural foraminal stenosis. Advanced right and mild-to-moderate left facet arthrosis.  IMPRESSION: 1. Interval posterior decompression at L4-5 without residual stenosis. 2. New mild spinal stenosis at L2-3 primarily due to posterior element hypertrophy. 3. New anterolisthesis at L5-S1 with small left foraminal disc extrusion potentially affecting the left L5 nerve. 4. Progressive disc degeneration and retrolisthesis at L1-2 without stenosis.   Electronically Signed   By: Sebastian Ache M.D.   On: 05/17/2017 16:43  She reports that  has never smoked. she has never used smokeless tobacco. No results for input(s): HGBA1C, LABURIC in the last 8760 hours.  Objective:  VS:  HT:    WT:   BMI:  BP:(!) 177/94  HR:68bpm  TEMP: ( )  RESP:  Physical Exam  Constitutional: She is oriented to person, place, and time. She appears well-developed and well-nourished. No distress.  HENT:  Head: Normocephalic and atraumatic.  Nose: Nose normal.  Mouth/Throat: Oropharynx is clear and moist.  Eyes: Conjunctivae and EOM are normal. Pupils are equal, round, and reactive to light.  Neck: Normal range of motion. Neck supple.  Cardiovascular: Normal rate, regular rhythm and intact distal pulses.  Pulmonary/Chest: Effort normal. No respiratory distress.  Abdominal: She exhibits no distension. There is no guarding.    Musculoskeletal:  The patient is slow to rise from a seated position.  She does have pain with extension rotation of the lumbar spine both right and left.  There is no active trigger points noted.  She has some mild tenderness over the right greater trochanter.  No pain with hip rotation.  She has a healed surgical scar on both knees with the more newer scar on the right.  She has good distal strength without clonus.  Neurological: She is alert and oriented to person, place, and time. She exhibits normal muscle tone. Coordination normal.  Skin: Skin is warm and dry. No rash noted. No erythema.  Psychiatric: She has a normal mood and affect. Her behavior is normal.  Nursing note and vitals reviewed.   Ortho Exam Imaging: No results found.  Past Medical/Family/Surgical/Social History: Medications & Allergies reviewed per EMR Patient Active Problem List   Diagnosis Date Noted  . Arthritis of knee 06/10/2017  . 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 S/P shoulder replacement 10/06/2015  . Degenerative arthritis of knee 04/18/2015   Past Medical History:  Diagnosis Date  . Anxiety   . 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 Asthma   . CKD (chronic kidney disease), stage III (HCC)   . Complication of anesthesia    blood loss after back surgery because hernia was scratched and "torn" during intubation (02/15/11 EGD showed GIB d/t esophageal ulcer)  . COPD (chronic obstructive pulmonary disease) (HCC)   . Environmental and seasonal allergies   . Full dentures   . GERD (gastroesophageal reflux disease)   . Headache    sinus  . History of hiatal hernia   . History of kidney stones   . History of stress test 2012   pt. reports it was resulted normal-HPR  . Hypertension   . Hypothyroidism   . Iron deficiency anemia    from Care Everywhere  . Osteoporosis   . Pneumonia   . Shortness of breath dyspnea    climbing steps  . Stress incontinence    from  Care Everywhere   Family History  Problem Relation Age of Onset  . CAD Mother   . Emphysema Father   . Alcoholism Father    Past Surgical History:  Procedure Laterality Date  . ABDOMINAL HYSTERECTOMY    . BACK SURGERY     lumbar x 3, 1 thoracic spine  . CHOLECYSTECTOMY    . COLONOSCOPY    . EYE SURGERY Bilateral    cataract surgery with lens implant  . FRACTURE SURGERY    . HERNIA REPAIR     hiatal hernia "removed"- HPR, 1990  . JOINT REPLACEMENT Left 04/2105   knee  . OPEN REDUCTION INTERNAL FIXATION (ORIF) DISTAL RADIAL FRACTURE Left 03/14/2015   Procedure: OPEN REDUCTION INTERNAL FIXATION (ORIF) LEFT DISTAL RADIUS FRACTURE;  Surgeon: Cammy CopaScott Gregory Dean, MD;  Location:  [facility] OR;  Service: Orthopedics;  Laterality: Left;  . TONSILLECTOMY    . TOTAL KNEE ARTHROPLASTY Left 04/18/2015   Procedure: TOTAL KNEE ARTHROPLASTY;  Surgeon: Cammy Copa, MD;  Location: [facility] OR;  Service: Orthopedics;  Laterality: Left;  . TOTAL KNEE ARTHROPLASTY Right 06/10/2017  . TOTAL KNEE ARTHROPLASTY Right 06/10/2017   Procedure: RIGHT TOTAL KNEE ARTHROPLASTY;  Surgeon: Cammy Copa, MD;  Location[facility] OR;  Service: Orthopedics;  Laterality: Right;  . TOTAL SHOULDER ARTHROPLASTY Right 10/06/2015   Procedure: RIGHT TOTAL SHOULDER ARTHROPLASTY;  Surgeon: Beverely Low, MD;  Location: [facility] OR;  Service: Orthopedics;  Laterality: Right;  . TOTAL SHOULDER ARTHROPLASTY Left 11/22/2016   Procedure: TOTAL SHOULDER ARTHROPLASTY;  Surgeon: Beverely Low, MD;  Location[facility] OR;  Service: Orthopedics;  Laterality: Left;   Social History   Occupational History  . Not on file  Tobacco Use  . Smoking status: Never Smoker  . Smokeless tobacco: Never Used  Substance and Sexual Activity  . Alcohol use: No  . Drug use: No  . Sexual activity: Not on file

## 2017-08-05 NOTE — Telephone Encounter (Signed)
Received auth. Scheduled pt for 08/18/17 @ 10:00.

## 2017-08-14 ENCOUNTER — Ambulatory Visit (INDEPENDENT_AMBULATORY_CARE_PROVIDER_SITE_OTHER): Payer: Medicare HMO

## 2017-08-14 ENCOUNTER — Encounter (INDEPENDENT_AMBULATORY_CARE_PROVIDER_SITE_OTHER): Payer: Self-pay | Admitting: Physical Medicine and Rehabilitation

## 2017-08-14 ENCOUNTER — Ambulatory Visit (INDEPENDENT_AMBULATORY_CARE_PROVIDER_SITE_OTHER): Payer: Medicare HMO | Admitting: Physical Medicine and Rehabilitation

## 2017-08-14 VITALS — BP 128/77 | HR 71

## 2017-08-14 DIAGNOSIS — M47816 Spondylosis without myelopathy or radiculopathy, lumbar region: Secondary | ICD-10-CM | POA: Diagnosis not present

## 2017-08-14 DIAGNOSIS — M961 Postlaminectomy syndrome, not elsewhere classified: Secondary | ICD-10-CM | POA: Diagnosis not present

## 2017-08-14 DIAGNOSIS — M545 Low back pain: Secondary | ICD-10-CM | POA: Diagnosis not present

## 2017-08-14 DIAGNOSIS — G8929 Other chronic pain: Secondary | ICD-10-CM | POA: Diagnosis not present

## 2017-08-14 MED ORDER — DEXAMETHASONE SODIUM PHOSPHATE 10 MG/ML IJ SOLN
15.0000 mg | Freq: Once | INTRAMUSCULAR | Status: AC
  Administered 2017-08-14: 15 mg

## 2017-08-14 MED ORDER — LIDOCAINE HCL (PF) 1 % IJ SOLN: 2.0000 mL | Freq: Once | INTRAMUSCULAR | Status: DC

## 2017-08-14 MED ORDER — BUPIVACAINE HCL 0.5 % IJ SOLN: 3.0000 mL | Freq: Once | INTRAMUSCULAR | Status: DC

## 2017-08-14 MED ORDER — LIDOCAINE HCL (PF) 1 % IJ SOLN
2.0000 mL | Freq: Once | INTRAMUSCULAR | Status: AC
  Administered 2017-08-14: 2 mL

## 2017-08-14 NOTE — Progress Notes (Deleted)
Jade Walker is a 71 y.o female who comes in today for an injection. Pain in lower back for years. Getting worse. Had back surgery 13 years ago. Saw Dr. August Saucerean and he recommended for her to get injection. She is having a lot pain. Pain level today (8/10). Constant pain. Sometimes pain radiates down legs. Worse when standing or bending. Better when laying down. Takes Oxycodone for pain which helps some.

## 2017-08-14 NOTE — Patient Instructions (Signed)

## 2017-08-18 ENCOUNTER — Encounter (INDEPENDENT_AMBULATORY_CARE_PROVIDER_SITE_OTHER): Payer: Medicare HMO | Admitting: Physical Medicine and Rehabilitation

## 2017-08-26 NOTE — Procedures (Signed)
[name] is a 71 year old female who is 6-7 weeks status post right total knee replacement by Dr. August Saucerean.  He has sent her here for diagnostic lumbar spine injection for her chronic low back pain.  She has had prior lumbar surgery 13 years ago has been having increasing low back pain that seems to be interfering with her recovery from her knee surgery.  She reports constant pain 8 out of 10 out of her knee is helped some.  Reports some radiating symptoms in the legs but her main complaint is her back pain.  Is worse with standing and bending.  She has a lumbar fusion at L4-5 and this is likely facet mediated pain below her fusion.  We are going to complete diagnostic medial branch blocks of the L5-S1 facet joint.  She likely would represent a decent candidate for radiofrequency ablation.  He has had recent physical therapy and prolonged history of low back pain not relieved with medications.  Lumbar Diagnostic Facet Joint Nerve Block with Fluoroscopic Guidance   Patient: Jade Walker      Date of Birth: [birth year] MRN: [id number] PCP: Jade Walker, [name], PA      Visit Date: 08/14/2017   Universal Protocol:    Date/Time: 12/18/185:21 AM  Consent Given By: the patient  Position: PRONE  Additional Comments: Vital signs were monitored before and after the procedure. Patient was prepped and draped in the usual sterile fashion. The correct patient, procedure, and site was verified.   Injection Procedure Details:  Procedure Site One Meds Administered:  Meds ordered this encounter  Medications  . DISCONTD: lidocaine (PF) (XYLOCAINE) 1 % injection 2 mL  . lidocaine (PF) (XYLOCAINE) 1 % injection 2 mL  . bupivacaine (MARCAINE) 0.5 % (with pres) injection 3 mL  . dexamethasone (DECADRON) injection 15 mg     Laterality: Bilateral  Location/Site:  L4 medial branches and L5 dorsal rami L5-S1  Needle size: 22 ga.  Needle type:spinal  Needle Placement: Oblique  pedical  Findings:   -Comments: There was excellent flow of contrast along the articular pillars without intravascular flow.  Procedure Details: The fluoroscope beam is vertically oriented in AP and then obliqued 15 to 20 degrees to the ipsilateral side of the desired nerve to achieve the "Scotty dog" appearance.  The skin over the target area of the junction of the superior articulating process and the transverse process (sacral ala if blocking the L5 dorsal rami) was locally anesthetized with a 1 ml volume of 1% Lidocaine without Epinephrine.  The spinal needle was inserted and advanced in a trajectory view down to the target.   After contact with periosteum and negative aspirate for blood and CSF, correct placement without intravascular or epidural spread was confirmed by injecting 0.5 ml. of Isovue-250.  A spot radiograph was obtained of this image.    Next, a 0.5 ml. volume of the injectate described above was injected. The needle was then redirected to the other facet joint nerves mentioned above if needed.  Prior to the procedure, the patient was given a Pain Diary which was completed for baseline measurements.  After the procedure, the patient rated their pain every 30 minutes and will continue rating at this frequency for a total of 5 hours.  The patient has been asked to complete the Diary and return to us by mail, fax or hand delivered as soon as possible.   Additional Comments:  The patient tolerated the procedure well Dressing: Band-Aid    Post-procedure  details: Patient was observed during the procedure. Post-procedure instructions were reviewed.  Patient left the clinic in stable condition.

## 2017-09-04 ENCOUNTER — Telehealth (INDEPENDENT_AMBULATORY_CARE_PROVIDER_SITE_OTHER): Payer: Self-pay | Admitting: Orthopedic Surgery

## 2017-09-04 MED ORDER — OXYCODONE-ACETAMINOPHEN 5-325 MG PO TABS: 1.0000 | ORAL_TABLET | Freq: Four times a day (QID) | ORAL | 0 refills | Status: DC | PRN

## 2017-09-04 NOTE — Telephone Encounter (Signed)
Can come and pick up script 

## 2017-09-04 NOTE — Telephone Encounter (Signed)
Right TKA in October. Can you please advise since Dr August Saucerean out of the office?

## 2017-09-04 NOTE — Telephone Encounter (Signed)
IC advised could pick up at front desk.  

## 2017-09-04 NOTE — Telephone Encounter (Signed)
Patient called needing Rx refill on Oxycodone 5 mg  Call patient once ready [phone]

## 2017-09-15 ENCOUNTER — Ambulatory Visit (INDEPENDENT_AMBULATORY_CARE_PROVIDER_SITE_OTHER): Payer: Medicare HMO | Admitting: Orthopedic Surgery

## 2017-09-15 ENCOUNTER — Encounter (INDEPENDENT_AMBULATORY_CARE_PROVIDER_SITE_OTHER): Payer: Self-pay | Admitting: Orthopedic Surgery

## 2017-09-15 DIAGNOSIS — Z96651 Presence of right artificial knee joint: Secondary | ICD-10-CM

## 2017-09-15 NOTE — Progress Notes (Signed)
Post-Op Visit Note   Patient: Jade Walker           Date of Birth: August 20, 1946           MRN: [id number] Visit Date: 09/15/2017 PCP: Jade CleverlyBeane, [name], PA   Assessment & Plan:  Chief Complaint:  Chief Complaint  Patient presents with  . Right Knee - Follow-up   Visit Diagnoses:  1. Presence of right artificial knee joint     Plan: Jade DroneBrenda is a 72 year old patient who is now 3 months out right total knee replacement.  She is doing well overall.  On exam she has excellent range of motion full extension to about 120 of flexion.  She is taking pain pills at night primarily for her back.  She retired from [employer].  Plan at this time is to continue with activity as tolerated.  Follow-up with me as needed.  Need for antibiotic prophylaxis before any type of procedure discussed.  Follow-Up Instructions: Return if symptoms worsen or fail to improve.   Orders:  No orders of the defined types were placed in this encounter.  No orders of the defined types were placed in this encounter.   Imaging: No results found.  PMFS History: Patient Active Problem List   Diagnosis Date Noted  . Arthritis of knee 06/10/2017  . 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 S/P shoulder replacement 10/06/2015  . Degenerative arthritis of knee 04/18/2015   Past Medical History:  Diagnosis Date  . Anxiety   . 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 Asthma   . CKD (chronic kidney disease), stage III (HCC)   . Complication of anesthesia    blood loss after back surgery because hernia was scratched and "torn" during intubation (02/15/11 EGD showed GIB d/t esophageal ulcer)  . COPD (chronic obstructive pulmonary disease) (HCC)   . Environmental and seasonal allergies   . Full dentures   . GERD (gastroesophageal reflux disease)   . Headache    sinus  . History of hiatal hernia   . History of kidney stones   . History of stress test 2012   pt. reports it  was resulted normal-HPR  . Hypertension   . Hypothyroidism   . Iron deficiency anemia    from Care Everywhere  . Osteoporosis   . Pneumonia   . Shortness of breath dyspnea    climbing steps  . Stress incontinence    from Care Everywhere    Family History  Problem Relation Age of Onset  . CAD Mother   . Emphysema Father   . Alcoholism Father     Past Surgical History:  Procedure Laterality Date  . ABDOMINAL HYSTERECTOMY    . BACK SURGERY     lumbar x 3, 1 thoracic spine  . CHOLECYSTECTOMY    . COLONOSCOPY    . EYE SURGERY Bilateral    cataract surgery with lens implant  . FRACTURE SURGERY    . HERNIA REPAIR     hiatal hernia "removed"- HPR, 1990  . JOINT REPLACEMENT Left 04/2105   knee  . OPEN REDUCTION INTERNAL FIXATION (ORIF) DISTAL RADIAL FRACTURE Left 03/14/2015   Procedure: OPEN REDUCTION INTERNAL FIXATION (ORIF) LEFT DISTAL RADIUS FRACTURE;  Surgeon: Cammy CopaScott Solomia Harrell, MD;  Location: [facility] OR;  Service: Orthopedics;  Laterality: Left;  . TONSILLECTOMY    . TOTAL KNEE ARTHROPLASTY Left 04/18/2015   Procedure: TOTAL KNEE ARTHROPLASTY;  Surgeon: Cammy CopaScott Yale Golla, MD;  Location: [facility] OR;  Service: Orthopedics;  Laterality: Left;  . TOTAL KNEE ARTHROPLASTY Right 06/10/2017  . TOTAL KNEE ARTHROPLASTY Right 06/10/2017   Procedure: RIGHT TOTAL KNEE ARTHROPLASTY;  Surgeon: Cammy Copa, MD;  Location: [facility] OR;  Service: Orthopedics;  Laterality: Right;  . TOTAL SHOULDER ARTHROPLASTY Right 10/06/2015   Procedure: RIGHT TOTAL SHOULDER ARTHROPLASTY;  Surgeon: Beverely Low, MD;  Location: [facility] OR;  Service: Orthopedics;  Laterality: Right;  . TOTAL SHOULDER ARTHROPLASTY Left 11/22/2016   Procedure: TOTAL SHOULDER ARTHROPLASTY;  Surgeon: Beverely Low, MD;  Location: [facility] OR;  Service: Orthopedics;  Laterality: Left;   Social History   Occupational History  . Not on file  Tobacco Use  . Smoking status: Never Smoker  . Smokeless tobacco: Never Used  Substance and Sexual Activity  .  Alcohol use: No  . Drug use: No  . Sexual activity: Not on file

## 2017-09-22 ENCOUNTER — Encounter (INDEPENDENT_AMBULATORY_CARE_PROVIDER_SITE_OTHER): Payer: Medicare HMO | Admitting: Physical Medicine and Rehabilitation

## 2017-09-22 ENCOUNTER — Ambulatory Visit (INDEPENDENT_AMBULATORY_CARE_PROVIDER_SITE_OTHER): Payer: Medicare HMO | Admitting: Physical Medicine and Rehabilitation

## 2017-09-22 ENCOUNTER — Encounter (INDEPENDENT_AMBULATORY_CARE_PROVIDER_SITE_OTHER): Payer: Self-pay | Admitting: Physical Medicine and Rehabilitation

## 2017-09-22 ENCOUNTER — Ambulatory Visit (INDEPENDENT_AMBULATORY_CARE_PROVIDER_SITE_OTHER): Payer: Medicare HMO

## 2017-09-22 VITALS — BP 122/77 | HR 68 | Temp 98.4°F

## 2017-09-22 DIAGNOSIS — M47816 Spondylosis without myelopathy or radiculopathy, lumbar region: Secondary | ICD-10-CM

## 2017-09-22 MED ORDER — DEXAMETHASONE SODIUM PHOSPHATE 10 MG/ML IJ SOLN
15.0000 mg | Freq: Once | INTRAMUSCULAR | Status: AC
  Administered 2017-09-22: 15 mg

## 2017-09-22 MED ORDER — BUPIVACAINE HCL 0.5 % IJ SOLN
3.0000 mL | Freq: Once | INTRAMUSCULAR | Status: AC
  Administered 2017-09-22: 3 mL

## 2017-09-22 NOTE — Progress Notes (Deleted)
Pt states pain in lower back that radiates to the right hip and right thigh. Pt states pain has been there for several years. Pt states a lot of walking and bending makes pain worse, medication eases pain. Pt states previous injection on 08/14/17 helped a lot. +Driver, -BT, -Dye Allergies.

## 2017-09-22 NOTE — Patient Instructions (Signed)

## 2017-09-23 NOTE — Procedures (Signed)
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  She has had prior lumbar fusion at L3-4 and L4-5 and laminectomy at L5-S1.  She has multiple levels of degenerative listhesis but no current stenosis.  She does have arthropathy and listhesis which is new below the fusion.  We have completed diagnostic medial branch blocks with good relief shown on a pain diary.  We are going to complete the second diagnostic block and a double block paradigm today at the L5-S1 level.  She has had multiple bouts of physical therapy and medication management.  Takes one hydrocodone daily and sometimes 2 if it is bad.  She has had all other manner of conservative non-conservative care.  Depending on the relief with the pain diary we would look at radiofrequency ablation of the lower facet joint.  Lumbar Diagnostic Facet Joint Nerve Block with Fluoroscopic Guidance   Patient: Jade Walker      Date of Birth: Mar 17, 1946 MRN: [id number] PCP: [name], [name], PA      Visit Date: 09/22/2017   Universal Protocol:    Date/Time: 01/15/194:56 AM  Consent Given By: the patient  Position: PRONE  Additional Comments: Vital signs were monitored before and after the procedure. Patient was prepped and draped in the usual sterile fashion. The correct patient, procedure, and site was verified.   Injection Procedure Details:  Procedure Site One Meds Administered:  Meds ordered this encounter  Medications  . bupivacaine (MARCAINE) 0.5 % (with pres) injection 3 mL  . dexamethasone (DECADRON) injection 15 mg     Laterality: Bilateral  Location/Site:  L5-S1  Needle size: 22 ga.  Needle type:spinal  Needle Placement: Oblique pedical  Findings:   -Comments: There was excellent flow of contrast along the articular pillars without intravascular flow.  Procedure Details: The fluoroscope beam is vertically oriented in AP and then obliqued 15 to 20 degrees to the  ipsilateral side of the desired nerve to achieve the "Scotty dog" appearance.  The skin over the target area of the junction of the superior articulating process and the transverse process (sacral ala if blocking the L5 dorsal rami) was locally anesthetized with a 1 ml volume of 1% Lidocaine without Epinephrine.  The spinal needle was inserted and advanced in a trajectory view down to the target.   After contact with periosteum and negative aspirate for blood and CSF, correct placement without intravascular or epidural spread was confirmed by injecting 0.5 ml. of Isovue-250.  A spot radiograph was obtained of this image.    Next, a 0.5 ml. volume of the injectate described above was injected. The needle was then redirected to the other facet joint nerves mentioned above if needed.  Prior to the procedure, the patient was given a Pain Diary which was completed for baseline measurements.  After the procedure, the patient rated their pain every 30 minutes and will continue rating at this frequency for a total of 5 hours.  The patient has been asked to complete the Diary and return to us by mail, fax or hand delivered as soon as possible.   Additional Comments:  The patient tolerated the procedure well Dressing: Band-Aid    Post-procedure details: Patient was observed during the procedure. Post-procedure instructions were reviewed.  Patient left the clinic in stable condition.  Pertinent Imaging: MRI LUMBAR SPINE WITHOUT CONTRAST  TECHNIQUE: Multiplanar, multisequence MR imaging of the lumbar spine was performed. No intravenous contrast was administered.  COMPARISON:  07/19/2010  FINDINGS: Segmentation:  Standard.  Alignment: Exaggerated lumbar lordosis. Increased retrolisthesis of L1 on L2 measuring 4 mm. New trace retrolisthesis of L2 on L3. Unchanged 5 mm anterolisthesis of L4 on L5. New 3 mm anterolisthesis of L5 on S1.  Vertebrae: No fracture, suspicious osseous lesion, or  significant marrow edema. New small Schmorl's nodes at T11-12.  Conus medullaris: Extends to the L1-2 level and appears normal.  Paraspinal and other soft tissues: Postoperative changes in the posterior lower lumbar soft tissues with small fluid collection in the laminectomy bed at L4-5. Lower lumbar paraspinal muscle atrophy bilaterally.  Disc levels:  Disc desiccation throughout the lumbar spine.  T10-11: Unchanged mild disc degeneration. No disc herniation or stenosis. Mild facet arthrosis.  T11-12: Mildly progressive disc degeneration. No disc herniation or stenosis.  T12-L1: Minimal disc bulging and slight facet hypertrophy without stenosis, unchanged.  L1-2: Increased disc space narrowing and retrolisthesis. Disc bulging and mild-to-moderate facet and ligamentum flavum hypertrophy without significant stenosis.  L2-3: Moderate facet and ligamentum flavum hypertrophy and minimal disc bulging result in new mild spinal stenosis without neural foraminal stenosis.  L3-4:  Interval posterior decompression and fusion.  No stenosis.  L4-5: Prior posterior decompression and fusion. Interval removal of L5 pedicle screws. Listhesis with disc uncovering. No stenosis.  L5-S1: Prior posterior decompression. New anterolisthesis with mild disc uncovering and a a new small left foraminal disc extrusion resulting in mild left neural foraminal stenosis and potential left L5 nerve root impingement. No spinal or right neural foraminal stenosis. Advanced right and mild-to-moderate left facet arthrosis.  IMPRESSION: 1. Interval posterior decompression at L4-5 without residual stenosis. 2. New mild spinal stenosis at L2-3 primarily due to posterior element hypertrophy. 3. New anterolisthesis at L5-S1 with small left foraminal disc extrusion potentially affecting the left L5 nerve. 4. Progressive disc degeneration and retrolisthesis at L1-2  without stenosis.   Electronically Signed   By: Sebastian Ache M.D.   On: 05/17/2017 16:43

## 2017-11-12 ENCOUNTER — Ambulatory Visit (INDEPENDENT_AMBULATORY_CARE_PROVIDER_SITE_OTHER): Payer: Medicare HMO | Admitting: Physical Medicine and Rehabilitation

## 2017-11-12 ENCOUNTER — Ambulatory Visit (INDEPENDENT_AMBULATORY_CARE_PROVIDER_SITE_OTHER): Payer: Medicare HMO

## 2017-11-12 ENCOUNTER — Encounter (INDEPENDENT_AMBULATORY_CARE_PROVIDER_SITE_OTHER): Payer: Self-pay | Admitting: Physical Medicine and Rehabilitation

## 2017-11-12 VITALS — BP 148/90

## 2017-11-12 DIAGNOSIS — M47816 Spondylosis without myelopathy or radiculopathy, lumbar region: Secondary | ICD-10-CM | POA: Diagnosis not present

## 2017-11-12 MED ORDER — METHYLPREDNISOLONE ACETATE 80 MG/ML IJ SUSP
80.0000 mg | Freq: Once | INTRAMUSCULAR | Status: AC
  Administered 2017-11-12: 80 mg

## 2017-11-12 NOTE — Progress Notes (Signed)
.  Numeric Pain Rating Scale and Functional Assessment Average Pain 9   In the last MONTH (on 0-10 scale) has pain interfered with the following?  1. General activity like being  able to carry out your everyday physical activities such as walking, climbing stairs, carrying groceries, or moving a chair?  Rating(8 )   +Driver

## 2017-11-12 NOTE — Patient Instructions (Signed)

## 2017-11-12 NOTE — Progress Notes (Signed)
Jade Walker - 72 y.o. female MRN [id number]  Date of birth: February 28, 1946  Office Visit Note: Visit Date: 11/12/2017 PCP: Shellia Cleverly, PA Referred by: Shellia Cleverly, PA  Subjective: Chief Complaint  Patient presents with  . Lower Back - Pain  . Right Leg - Pain  . Left Leg - Pain   HPI: Jade Walker is a 72 year old female who comes in today for planned radiofrequency ablation of the bilateral L5-S1 facet joint, bilateral L4 medial branches and L5 dorsal rami.  Her history which got a little bit more complicated today is basically that she has had prior lumbar fusion at L3-4 and L4-5 by Dr. Phoebe Perch.  She has had updated imaging not showing really much in the way of stenosis or nerve compression but facet arthropathy at L5-S1 below her fusion.  She is actually had hardware removed from the L4-5 level.  She was seen Dr. August Saucer in our office for orthopedic care which included surgery to her knee.  He had referred her to me for her back pain which we had seen her on a couple of occasions for diagnostic facet blocks.  She has had therapy in the past.  At the time she was getting a very small amount of oxycodone from Dr. August Saucer.  This was post surgery.  She evidently talked to her primary care physician Dr. Shelle Walker about continuing pain medication which according to the patient has one oxycodone at night only.  It appears that she has been referred to the pain management clinic in [facility].  It sounds like this may be Dr. Synetta Fail practice.  She has not seen them yet but is going for an initial evaluation in a week or 2.  I think at this point since she is here today to complete the radiofrequency ablation we should go ahead and complete that.  They should be able to see our notes in the chart.  She probably does need more comprehensive pain management and I know they could look at radiofrequency ablation as well should this need to be repeated at some point.  She also lives in [place] and that  clinic would be much more conducive to seeing her.  She also at times has complained of neck pain and other musculoskeletal pain to Dr. August Saucer.  She has not received any opioid medications from [place].    ROS Otherwise per HPI.  Assessment & Plan: Visit Diagnoses:  1. Spondylosis without myelopathy or radiculopathy, lumbar region     Plan: Findings:  Chronic worsening low back pain which is predominantly axial below her fusion.  She did well with double diagnostic facet joint nerve blocks.  We have completed bilateral L5-S1 facet joint denervation/radiofrequency ablation today.    Meds & Orders:  Meds ordered this encounter  Medications  . methylPREDNISolone acetate (DEPO-MEDROL) injection 80 mg    Orders Placed This Encounter  Procedures  . Radiofrequency,Lumbar  . XR C-ARM NO REPORT    Follow-up: Return if symptoms worsen or fail to improve.   Procedures: No procedures performed  Lumbar Facet Joint Nerve Denervation  Patient: Jade Walker      Date of Birth: [birth year] MRN: [id number] PCP: Shellia Cleverly, PA      Visit Date: 11/12/2017   Universal Protocol:    Date/Time: 03/06/194:31 PM  Consent Given By: the patient  Position: PRONE  Additional Comments: Vital signs were monitored before and after the procedure. Patient was prepped and draped in the  usual sterile fashion. The correct patient, procedure, and site was verified.   Injection Procedure Details:  Procedure Site One Meds Administered:  Meds ordered this encounter  Medications  . methylPREDNISolone acetate (DEPO-MEDROL) injection 80 mg     Laterality: Bilateral  Location/Site: Bilateral L4 medial branches and L5 dorsal rami L5-S1  Needle size: 18 G  Needle type: Radiofrequency cannula  Needle Placement: Along juncture of superior articular process and transverse pocess  Findings:  -Comments: Placement along the L4 medial branches was somewhat difficult given that there is some lateral mass  fusion and significant hypertrophy and ossification.  Procedure Details: For each desired target nerve, the corresponding transverse process (sacral ala for the L5 dorsal rami) was identified and the fluoroscope was positioned to square off the endplates of the corresponding vertebral body to achieve a true AP midline view.  The beam was then obliqued 15 to 20 degrees and caudally tilted 15 to 20 degrees to line up a trajectory along the target nerves. The skin over the target of the junction of superior articulating process and transverse process (sacral ala for the L5 dorsal rami) was infiltrated with 1ml of 1% Lidocaine without Epinephrine.  The 18 gauge 10mm active tip outer cannula was advanced in trajectory view to the target.  This procedure was repeated for each target nerve.  Then, for all levels, the outer cannula placement was fine-tuned and the position was then confirmed with bi-planar imaging.    Test stimulation was done both at sensory and motor levels to ensure there was no radicular stimulation. The target tissues were then infiltrated with 1 ml of 1% Lidocaine without Epinephrine. Subsequently, a percutaneous neurotomy was carried out for 60 seconds at 80 degrees Celsius. The procedure was repeated with the cannula rotated 90 degrees, for duration of 60 seconds, one additional time at each level for a total of two lesions per level.  After the completion of the two lesions, 1 ml of injectate was delivered. It was then repeated for each facet joint nerve mentioned above. Appropriate radiographs were obtained to verify the probe placement during the neurotomy.   Additional Comments:  The patient tolerated the procedure well Dressing: Band-Aid    Post-procedure details: Patient was observed during the procedure. Post-procedure instructions were reviewed.  Patient left the clinic in stable condition.      Clinical History: MRI LUMBAR SPINE WITHOUT  CONTRAST  TECHNIQUE: Multiplanar, multisequence MR imaging of the lumbar spine was performed. No intravenous contrast was administered.  COMPARISON:  07/19/2010  FINDINGS: Segmentation:  Standard.  Alignment: Exaggerated lumbar lordosis. Increased retrolisthesis of L1 on L2 measuring 4 mm. New trace retrolisthesis of L2 on L3. Unchanged 5 mm anterolisthesis of L4 on L5. New 3 mm anterolisthesis of L5 on S1.  Vertebrae: No fracture, suspicious osseous lesion, or significant marrow edema. New small Schmorl's nodes at T11-12.  Conus medullaris: Extends to the L1-2 level and appears normal.  Paraspinal and other soft tissues: Postoperative changes in the posterior lower lumbar soft tissues with small fluid collection in the laminectomy bed at L4-5. Lower lumbar paraspinal muscle atrophy bilaterally.  Disc levels:  Disc desiccation throughout the lumbar spine.  T10-11: Unchanged mild disc degeneration. No disc herniation or stenosis. Mild facet arthrosis.  T11-12: Mildly progressive disc degeneration. No disc herniation or stenosis.  T12-L1: Minimal disc bulging and slight facet hypertrophy without stenosis, unchanged.  L1-2: Increased disc space narrowing and retrolisthesis. Disc bulging and mild-to-moderate facet and ligamentum flavum hypertrophy without significant  stenosis.  L2-3: Moderate facet and ligamentum flavum hypertrophy and minimal disc bulging result in new mild spinal stenosis without neural foraminal stenosis.  L3-4:  Interval posterior decompression and fusion.  No stenosis.  L4-5: Prior posterior decompression and fusion. Interval removal of L5 pedicle screws. Listhesis with disc uncovering. No stenosis.  L5-S1: Prior posterior decompression. New anterolisthesis with mild disc uncovering and a a new small left foraminal disc extrusion resulting in mild left neural foraminal stenosis and potential left L5 nerve root impingement. No  spinal or right neural foraminal stenosis. Advanced right and mild-to-moderate left facet arthrosis.  IMPRESSION: 1. Interval posterior decompression at L4-5 without residual stenosis. 2. New mild spinal stenosis at L2-3 primarily due to posterior element hypertrophy. 3. New anterolisthesis at L5-S1 with small left foraminal disc extrusion potentially affecting the left L5 nerve. 4. Progressive disc degeneration and retrolisthesis at L1-2 without stenosis.   Electronically Signed   By: Sebastian Ache M.D.   On: 05/17/2017 16:43  She reports that  has never smoked. she has never used smokeless tobacco. No results for input(s): HGBA1C, LABURIC in the last 8760 hours.  Objective:  VS:  HT:    WT:   BMI:     BP:(!) 148/90  HR: bpm  TEMP: ( )  RESP:  Physical Exam  Musculoskeletal:  Patient ambulates without a cane with a fairly normal gait.  She has some pain with extension rotation of the lumbar spine.  She has no pain with hip rotation.  She has good distal strength without clonus.    Ortho Exam Imaging: No results found.  Past Medical/Family/Surgical/Social History: Medications & Allergies reviewed per EMR Patient Active Problem List   Diagnosis Date Noted  . Arthritis of knee 06/10/2017  . 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 S/P shoulder replacement 10/06/2015  . Degenerative arthritis of knee 04/18/2015   Past Medical History:  Diagnosis Date  . Anxiety   . 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 Asthma   . CKD (chronic kidney disease), stage III (HCC)   . Complication of anesthesia    blood loss after back surgery because hernia was scratched and "torn" during intubation (02/15/11 EGD showed GIB d/t esophageal ulcer)  . COPD (chronic obstructive pulmonary disease) (HCC)   . Environmental and seasonal allergies   . Full dentures   . GERD (gastroesophageal reflux disease)   . Headache    sinus  . History of hiatal hernia    . History of kidney stones   . History of stress test 2012   pt. reports it was resulted normal-HPR  . Hypertension   . Hypothyroidism   . Walker deficiency anemia    from Care Everywhere  . Osteoporosis   . Pneumonia   . Shortness of breath dyspnea    climbing steps  . Stress incontinence    from Care Everywhere   Family History  Problem Relation Age of Onset  . CAD Mother   . Emphysema Father   . Alcoholism Father    Past Surgical History:  Procedure Laterality Date  . ABDOMINAL HYSTERECTOMY    . BACK SURGERY     lumbar x 3, 1 thoracic spine  . CHOLECYSTECTOMY    . COLONOSCOPY    . EYE SURGERY Bilateral    cataract surgery with lens implant  . FRACTURE SURGERY    . HERNIA REPAIR     hiatal hernia "removed"- HPR, 1990  . JOINT REPLACEMENT  Left 04/2105   knee  . OPEN REDUCTION INTERNAL FIXATION (ORIF) DISTAL RADIAL FRACTURE Left 03/14/2015   Procedure: OPEN REDUCTION INTERNAL FIXATION (ORIF) LEFT DISTAL RADIUS FRACTURE;  Surgeon: Cammy Copa, MD;  Location: [facility] OR;  Service: Orthopedics;  Laterality: Left;  . TONSILLECTOMY    . TOTAL KNEE ARTHROPLASTY Left 04/18/2015   Procedure: TOTAL KNEE ARTHROPLASTY;  Surgeon: Cammy Copa, MD;  Location: [facility] OR;  Service: Orthopedics;  Laterality: Left;  . TOTAL KNEE ARTHROPLASTY Right 06/10/2017  . TOTAL KNEE ARTHROPLASTY Right 06/10/2017   Procedure: RIGHT TOTAL KNEE ARTHROPLASTY;  Surgeon: Cammy Copa, MD;  Location: [facility] OR;  Service: Orthopedics;  Laterality: Right;  . TOTAL SHOULDER ARTHROPLASTY Right 10/06/2015   Procedure: RIGHT TOTAL SHOULDER ARTHROPLASTY;  Surgeon: Beverely Low, MD;  Location: [facility] OR;  Service: Orthopedics;  Laterality: Right;  . TOTAL SHOULDER ARTHROPLASTY Left 11/22/2016   Procedure: TOTAL SHOULDER ARTHROPLASTY;  Surgeon: Beverely Low, MD;  Location: [facility] OR;  Service: Orthopedics;  Laterality: Left;   Social History   Occupational History  . Not on file  Tobacco Use  . Smoking status: Never  Smoker  . Smokeless tobacco: Never Used  Substance and Sexual Activity  . Alcohol use: No  . Drug use: No  . Sexual activity: Not on file

## 2017-11-12 NOTE — Procedures (Signed)
Lumbar Facet Joint Nerve Denervation  Patient: Jade Walker      Date of Birth: Sep 30, 1945 MRN: [id number] PCP: [name], [name], PA      Visit Date: 11/12/2017   Universal Protocol:    Date/Time: 03/06/194:31 PM  Consent Given By: the patient  Position: PRONE  Additional Comments: Vital signs were monitored before and after the procedure. Patient was prepped and draped in the usual sterile fashion. The correct patient, procedure, and site was verified.   Injection Procedure Details:  Procedure Site One Meds Administered:  Meds ordered this encounter  Medications  . methylPREDNISolone acetate (DEPO-MEDROL) injection 80 mg     Laterality: Bilateral  Location/Site: Bilateral L4 medial branches and L5 dorsal rami L5-S1  Needle size: 18 G  Needle type: Radiofrequency cannula  Needle Placement: Along juncture of superior articular process and transverse pocess  Findings:  -Comments: Placement along the L4 medial branches was somewhat difficult given that there is some lateral mass fusion and significant hypertrophy and ossification.  Procedure Details: For each desired target nerve, the corresponding transverse process (sacral ala for the L5 dorsal rami) was identified and the fluoroscope was positioned to square off the endplates of the corresponding vertebral body to achieve a true AP midline view.  The beam was then obliqued 15 to 20 degrees and caudally tilted 15 to 20 degrees to line up a trajectory along the target nerves. The skin over the target of the junction of superior articulating process and transverse process (sacral ala for the L5 dorsal rami) was infiltrated with 1ml of 1% Lidocaine without Epinephrine.  The 18 gauge 10mm active tip outer cannula was advanced in trajectory view to the target.  This procedure was repeated for each target nerve.  Then, for all levels, the outer cannula placement was fine-tuned and the position was then confirmed with bi-planar  imaging.    Test stimulation was done both at sensory and motor levels to ensure there was no radicular stimulation. The target tissues were then infiltrated with 1 ml of 1% Lidocaine without Epinephrine. Subsequently, a percutaneous neurotomy was carried out for 60 seconds at 80 degrees Celsius. The procedure was repeated with the cannula rotated 90 degrees, for duration of 60 seconds, one additional time at each level for a total of two lesions per level.  After the completion of the two lesions, 1 ml of injectate was delivered. It was then repeated for each facet joint nerve mentioned above. Appropriate radiographs were obtained to verify the probe placement during the neurotomy.   Additional Comments:  The patient tolerated the procedure well Dressing: Band-Aid    Post-procedure details: Patient was observed during the procedure. Post-procedure instructions were reviewed.  Patient left the clinic in stable condition.

## 2017-11-19 ENCOUNTER — Encounter (INDEPENDENT_AMBULATORY_CARE_PROVIDER_SITE_OTHER): Payer: Self-pay | Admitting: Physical Medicine and Rehabilitation

## 2019-08-10 IMAGING — MR MR LUMBAR SPINE W/O CM
4 of 5 series · 30 of 48 positions shown · non-contrast
Comparison: 07/19/2010

CLINICAL DATA: Pain in the right low back and posterior right leg
for 10 months. Prior lumbar fusion.

EXAM:
MRI LUMBAR SPINE WITHOUT CONTRAST
TECHNIQUE: Multiplanar, multisequence MR imaging of the lumbar spine was
performed. No intravenous contrast was administered.

[Series 3: T2 · sagittal · 4.0mm · 0.88mm/px · 4 of 12 slices shown (1 of 2)]
[im 1/12]
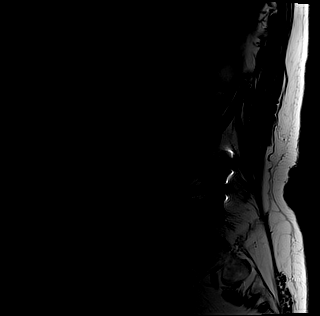
[im 4/12]
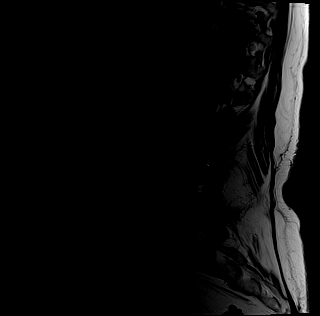
[im 8/12]
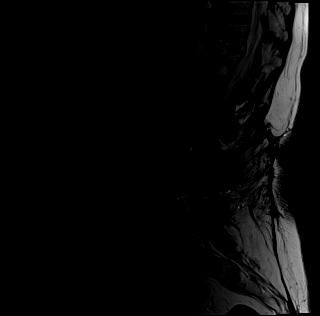
[im 12/12]
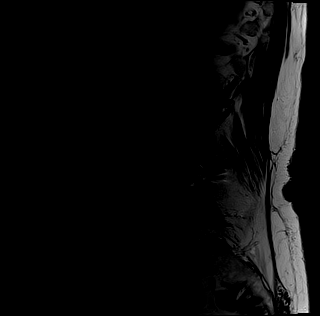

[Series 4: T1 · sagittal · 4.0mm · 0.88mm/px · 5 of 12 slices shown (1 of 2)]
[im 1/12]
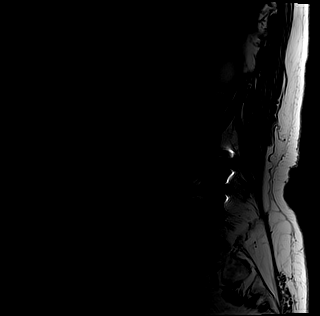
[im 3/12]
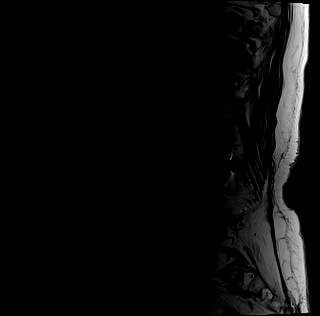
[im 6/12]
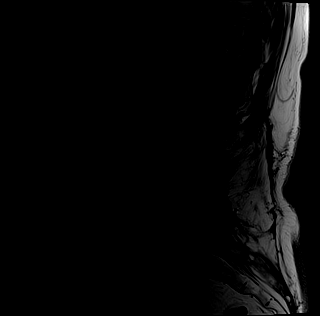
[im 9/12]
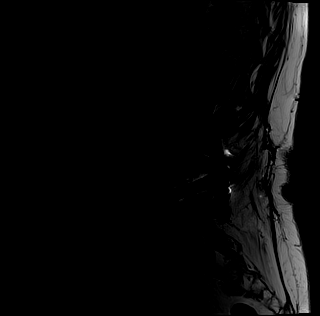
[im 12/12]
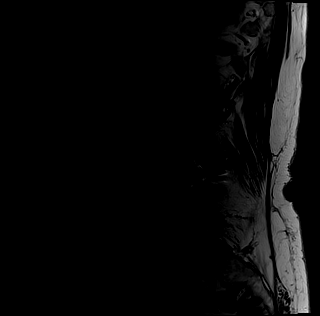

[Series 6: T1 · axial · 4.0mm · 0.37mm/px · z∈[+33,+208]mm · 10 of 40 slices shown (2 of 2)]
[im 3/40]
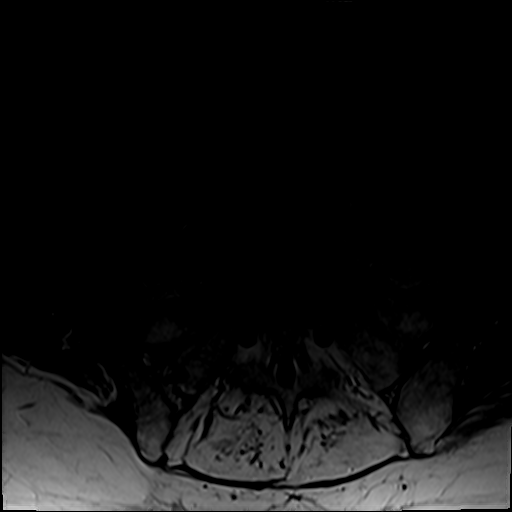
[im 5/40]
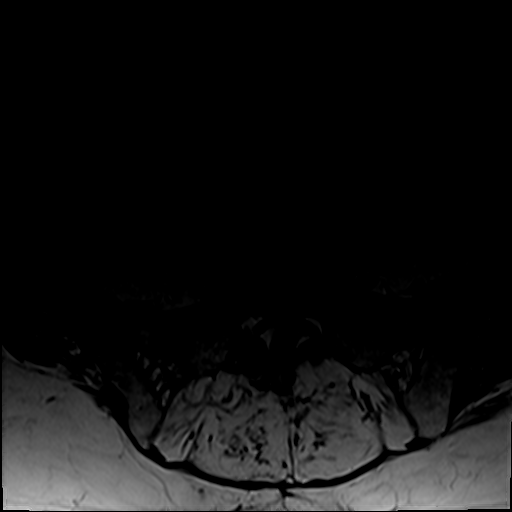
[im 8/40]
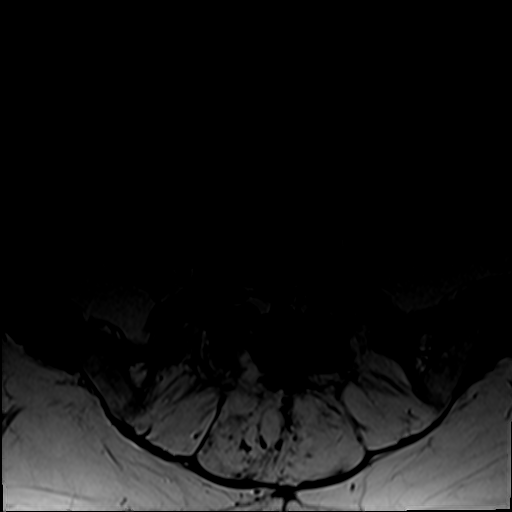
[im 13/40]
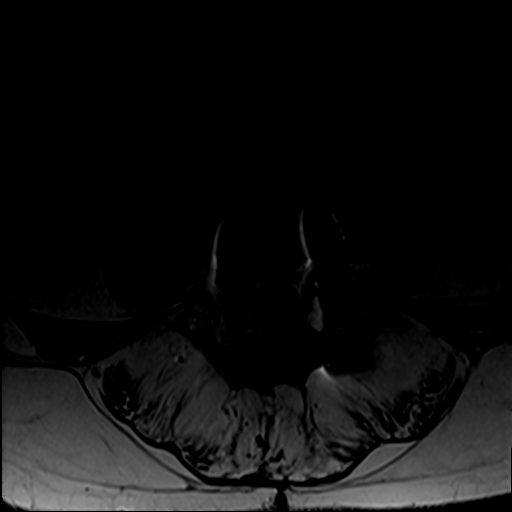
[im 18/40]
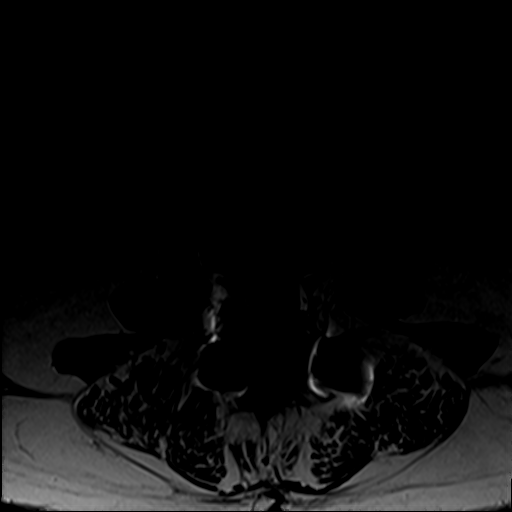
[im 20/40]
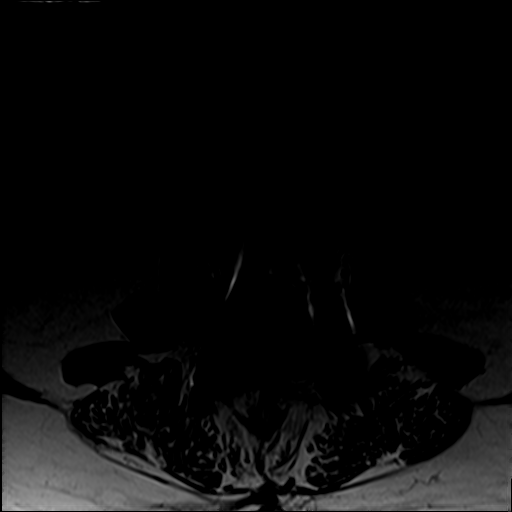
[im 22/40]
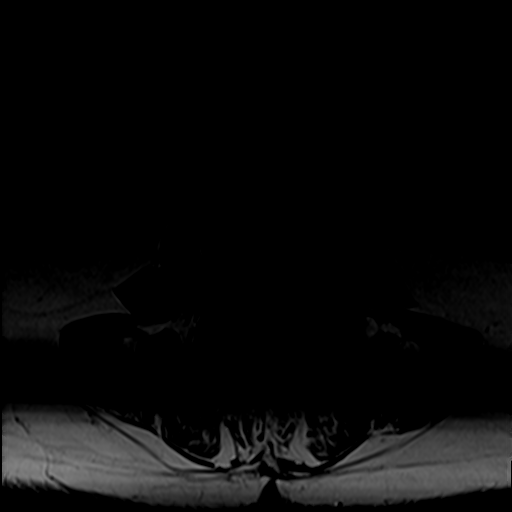
[im 27/40]
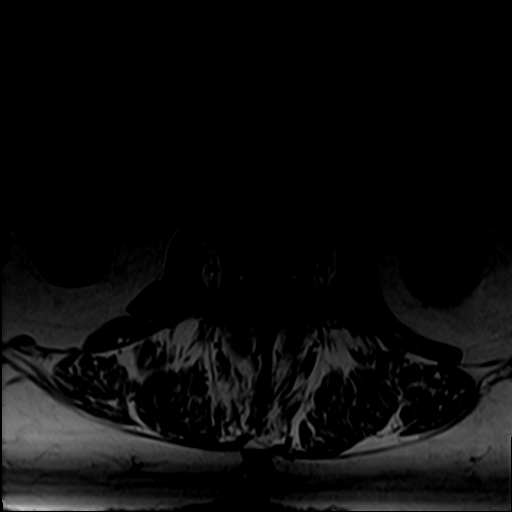
[im 32/40]
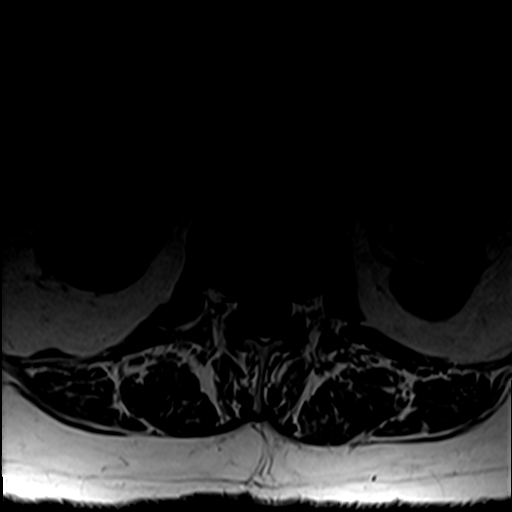
[im 35/40]
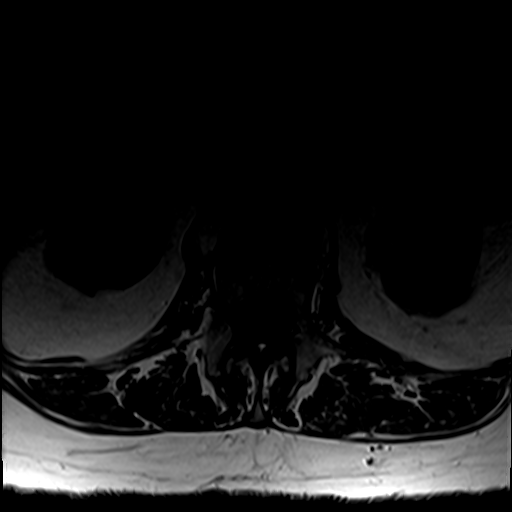

[Series 7: T2 · axial · 4.0mm · 0.59mm/px · z∈[+33,+216]mm · 11 of 40 slices shown (2 of 2)]
[im 3/40]
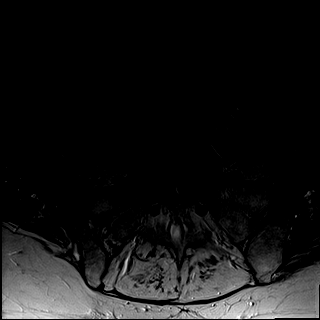
[im 5/40]
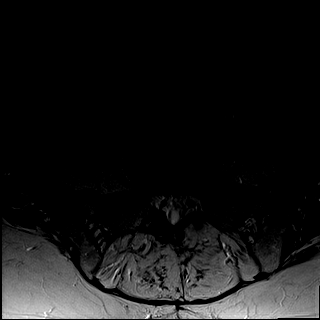
[im 8/40]
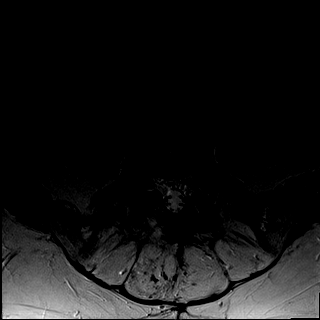
[im 13/40]
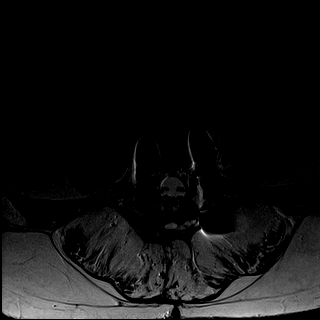
[im 18/40]
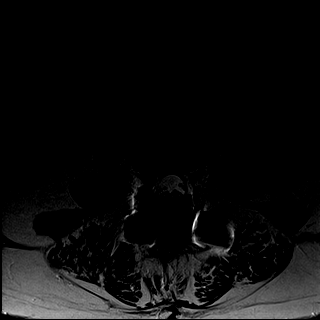
[im 20/40]
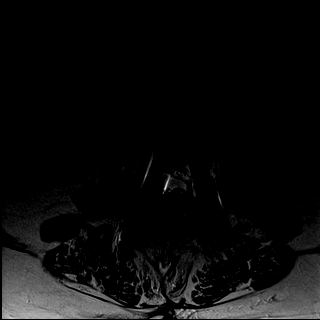
[im 22/40]
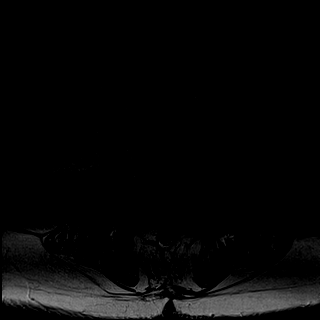
[im 27/40]
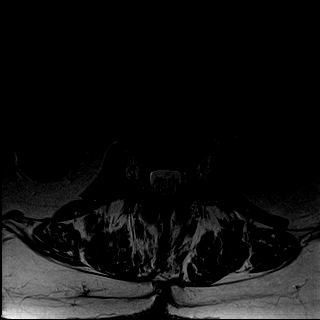
[im 32/40]
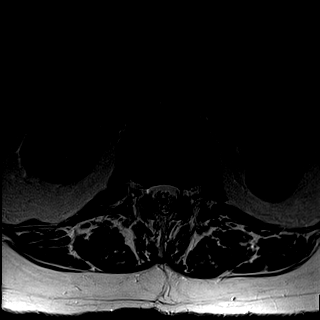
[im 35/40]
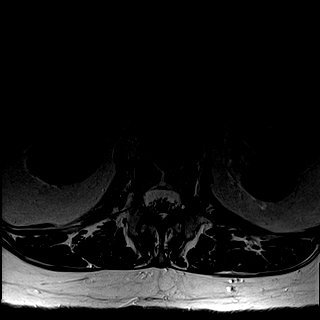
[im 37/40]
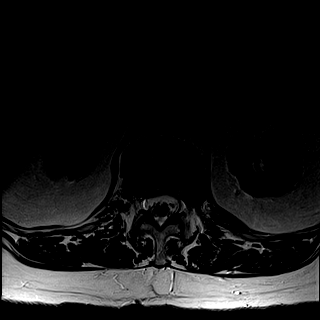

[30 of 48 positions shown; findings below may reference images not displayed]

FINDINGS: Segmentation:  Standard.

Alignment: Exaggerated lumbar lordosis. Increased retrolisthesis of
L1 on L2 measuring 4 mm. New trace retrolisthesis of L2 on L3.
Unchanged 5 mm anterolisthesis of L4 on L5. New 3 mm anterolisthesis
of L5 on S1.

Vertebrae: No fracture, suspicious osseous lesion, or significant
marrow edema. New small Schmorl's nodes at T11-12.

Conus medullaris: Extends to the L1-2 level and appears normal.

Paraspinal and other soft tissues: Postoperative changes in the
posterior lower lumbar soft tissues with small fluid collection in
the laminectomy bed at L4-5. Lower lumbar paraspinal muscle atrophy
bilaterally.

Disc levels:

Disc desiccation throughout the lumbar spine.

T10-11: Unchanged mild disc degeneration. No disc herniation or
stenosis. Mild facet arthrosis.

T11-12: Mildly progressive disc degeneration. No disc herniation or
stenosis.

T12-L1: Minimal disc bulging and slight facet hypertrophy without
stenosis, unchanged.

L1-2: Increased disc space narrowing and retrolisthesis. Disc
bulging and mild-to-moderate facet and ligamentum flavum hypertrophy
without significant stenosis.

L2-3: Moderate facet and ligamentum flavum hypertrophy and minimal
disc bulging result in new mild spinal stenosis without neural
foraminal stenosis.

L3-4:  Interval posterior decompression and fusion.  No stenosis.

L4-5: Prior posterior decompression and fusion. Interval removal of
L5 pedicle screws. Listhesis with disc uncovering. No stenosis.

L5-S1: Prior posterior decompression. New anterolisthesis with mild
disc uncovering and a a new small left foraminal disc extrusion
resulting in mild left neural foraminal stenosis and potential left
L5 nerve root impingement. No spinal or right neural foraminal
stenosis. Advanced right and mild-to-moderate left facet arthrosis.
IMPRESSION: 1. Interval posterior decompression at L4-5 without residual
stenosis.
2. New mild spinal stenosis at L2-3 primarily due to posterior
element hypertrophy.
3. New anterolisthesis at L5-S1 with small left foraminal disc
extrusion potentially affecting the left L5 nerve.
4. Progressive disc degeneration and retrolisthesis at L1-2 without
stenosis.

## 2020-03-08 ENCOUNTER — Other Ambulatory Visit: Payer: Self-pay | Admitting: Neurosurgery

## 2020-03-26 NOTE — Pre-Procedure Instructions (Signed)
[id number] - [place], [place] - [address] [place] [address] Phone: [phone] Fax: [phone]    Your procedure is scheduled on Wed., March 29, 2020 from 12:55PM-4:52PM  Report to [facility] Entrance "A" at 10:55AM  Call this number if you have problems the morning of surgery:  [phone]   Remember:  Do not eat or drink after midnight on July 20th    Take these medicines the morning of surgery with A SIP OF WATER: Cetirizine (ZYRTEC)     Citalopram (CELEXA)  Famotidine (PEPCID) Levothyroxine (SYNTHROID, LEVOTHROID) Omeprazole (PRILOSEC)   If Needed: HYDROcodone-acetaminophen (NORCO) Albuterol (PROVENTIL) Nebulizer Albuterol (PROVENTIL HFA;VENTOLIN HFA) Inhaler-bring with you day of surgery  As of today, taking all Aspirin (unless instructed by your doctor) and Other Aspirin containing products, Vitamins, Fish oils, and Herbal medications. Also stop all NSAIDS i.e. Advil, Ibuprofen, Motrin, Aleve, Anaprox, Naproxen, BC, Goody Powders, and all Supplements.  No Smoking of any kind, Tobacco, or Alcohol products 24 hours prior to your procedure. If you use a Cpap at night, you may bring all equipment for your overnight stay.   Special instructions:   [facility]- Preparing For Surgery  Before surgery, you can play an important role. Because skin is not sterile, your skin needs to be as free of germs as possible. You can reduce the number of germs on your skin by washing with CHG (chlorahexidine gluconate) Soap before surgery.  CHG is an antiseptic cleaner which kills germs and bonds with the skin to continue killing germs even after washing.    Please do not use if you have an allergy to CHG or antibacterial soaps. If your skin becomes reddened/irritated stop using the CHG.  Do not shave (including legs and underarms) for at least 48 hours prior to first CHG shower. It is OK to shave your  face.  Please follow these instructions carefully.   1. Shower the NIGHT BEFORE SURGERY and the MORNING OF SURGERY with CHG.   2. If you chose to wash your hair, wash your hair first as usual with your normal shampoo.  3. After you shampoo, rinse your hair and body thoroughly to remove the shampoo.  4. Use CHG as you would any other liquid soap. You can apply CHG directly to the skin and wash gently with a scrungie or a clean washcloth.   5. Apply the CHG Soap to your body ONLY FROM THE NECK DOWN.  Do not use on open wounds or open sores. Avoid contact with your eyes, ears, mouth and genitals (private parts). Wash Face and genitals (private parts)  with your normal soap.  6. Wash thoroughly, paying special attention to the area where your surgery will be performed.  7. Thoroughly rinse your body with warm water from the neck down.  8. DO NOT shower/wash with your normal soap after using and rinsing off the CHG Soap.  9. Pat yourself dry with a CLEAN TOWEL.  10. Wear CLEAN PAJAMAS to bed the night before surgery, wear comfortable clothes the morning of surgery  11. Place CLEAN SHEETS on your bed the night of your first shower and DO NOT SLEEP WITH PETS.  Day of Surgery:            Remember to brush your teeth WITH YOUR REGULAR TOOTHPASTE.   Do not wear jewelry, make-up or nail polish.  Do not wear lotions, powders, or perfumes, or deodorant.  Do  not shave 48 hours prior to surgery.   Do not bring valuables to the hospital.  [facility] is not responsible for any belongings or valuables.  Contacts, dentures or bridgework may not be worn into surgery.    For patients admitted to the hospital, discharge time will be determined by your treatment team.  Patients discharged the day of surgery will not be allowed to drive home, and someone age 74 and over needs to stay with them for 24 hours.   Please wear clean clothes to the hospital/surgery center.    Please read over the  following fact sheets that you were given.

## 2020-03-27 ENCOUNTER — Other Ambulatory Visit (HOSPITAL_COMMUNITY)
Admission: RE | Admit: 2020-03-27 | Discharge: 2020-03-27 | Disposition: A | Discharge status: Home / Self Care | Payer: Medicare HMO | Source: Ambulatory Visit | Attending: Neurosurgery | Admitting: Neurosurgery

## 2020-03-27 ENCOUNTER — Encounter (HOSPITAL_COMMUNITY)
Admission: RE | Admit: 2020-03-27 | Discharge: 2020-03-27 | Disposition: A | Discharge status: Home / Self Care | Payer: Medicare HMO | Source: Ambulatory Visit | Attending: Neurosurgery | Admitting: Neurosurgery

## 2020-03-27 ENCOUNTER — Encounter (HOSPITAL_COMMUNITY): Payer: Self-pay

## 2020-03-27 ENCOUNTER — Other Ambulatory Visit: Payer: Self-pay

## 2020-03-27 DIAGNOSIS — Z01818 Encounter for other preprocedural examination: Secondary | ICD-10-CM | POA: Insufficient documentation

## 2020-03-27 LAB — CBC
HCT: 40.9 % (ref 36.0–46.0)
Hemoglobin: 12.5 g/dL (ref 12.0–15.0)
MCH: 30.3 pg (ref 26.0–34.0)
MCHC: 30.6 g/dL (ref 30.0–36.0)
MCV: 99 fL (ref 80.0–100.0)
Platelets: 305 10*3/uL (ref 150–400)
RBC: 4.13 MIL/uL (ref 3.87–5.11)
RDW: 12.2 % (ref 11.5–15.5)
WBC: 6.9 10*3/uL (ref 4.0–10.5)
nRBC: 0 % (ref 0.0–0.2)

## 2020-03-27 LAB — BASIC METABOLIC PANEL
Anion gap: 9 (ref 5–15)
BUN: 12 mg/dL (ref 8–23)
CO2: 26 mmol/L (ref 22–32)
Calcium: 9.4 mg/dL (ref 8.9–10.3)
Chloride: 103 mmol/L (ref 98–111)
Creatinine, Ser: 1.98 mg/dL — ABNORMAL HIGH (ref 0.44–1.00)
GFR calc Af Amer: 28 mL/min — ABNORMAL LOW (ref >60)
GFR calc non Af Amer: 24 mL/min — ABNORMAL LOW (ref >60)
Glucose, Bld: 83 mg/dL (ref 70–99)
Potassium: 4.3 mmol/L (ref 3.5–5.1)
Sodium: 138 mmol/L (ref 135–145)

## 2020-03-27 LAB — SURGICAL PCR SCREEN
MRSA, PCR: NEGATIVE
Staphylococcus aureus: POSITIVE — AB

## 2020-03-27 LAB — SARS CORONAVIRUS 2 (TAT 6-24 HRS): SARS Coronavirus 2: NEGATIVE

## 2020-03-27 NOTE — Progress Notes (Signed)
PCP - Dr. Meredeth Ide in [facility] Cardiologist - denies  Chest x-ray - No indicated EKG - 03/27/20 Stress Test - Denies ECHO - Yes cannot recall date Cardiac Cath - Denies  Sleep Study - Denies  DM - Denies  COVID TEST- 03/27/20  Anesthesia review:  No   Patient denies shortness of breath, fever, cough and chest pain at PAT appointment   All instructions explained to the patient, with a verbal understanding of the material. Patient agrees to go over the instructions while at home for a better understanding. Patient also instructed to self quarantine after being tested for COVID-19. The opportunity to ask questions was provided.

## 2020-03-28 LAB — TYPE AND SCREEN
ABO/RH(D): A POS
Antibody Screen: NEGATIVE

## 2020-03-28 NOTE — Progress Notes (Signed)
Anesthesia Chart Review:  Mild intermittent asthmatic bronchitis, followed by PCP.  Maintained on Singulair.  Last seen by PCP 01/12/2020.  Doing well at that time, noted to be recovering well from recent C3-5 ACDF on 12/09/2019 by Dr. Lovell Sheehan.  History of large hiatal hernia. She also has history of esophageal ulcer; in 2012 her postop course after PLIF was complicated by hematemesis due to this.  She is on primidone for benign essential tremor.  CKD 4 followed by nephrologist Dr. Janit Pagan. Baseline creatinine 2.0-2.2 per PCP notes.  1.98 on preop labs.  Remainder of preop labs unremarkable.  EKG 03/27/20: Normal sinus rhythm. Rate 62. Inferior infarct , age undetermined. Doesn't appear significantly changed from prior.  CHEST2 VIEW 01/15/2017 (Care Everywhere): COMPARISON:03/29/2016   FINDINGS:  Heart size and vascularity normal. Lungs are clear without  infiltrate effusion or mass.   Hiatal hernia.Bilateral shoulder replacement.   IMPRESSION:  No active cardiopulmonary disease.   TTE 08/15/2016 (Care Everywhere): Summary  Ejection fraction is visually estimated at 55-60%  Normal left ventricular systolic function  Borderline left ventricular hypertrophy  Trace tricuspid regurgitation .   Stress echo 10/30/10 [facility]; scanned under Media tab, Correspondence dated 02/17/2011): Poor exercise tolerance. Low normal resting LV systolic function 55%. Hypertensive resting BP with normal response. LV function at peak exercise in not hyperdynamic but there are no focal wall motion abnormaliies. Negative exercise treadmill test. Equivocal stress echo with no focal wall motion abnormality post exercise, but without hyperdynamic response.    [facility] Short Stay Center/Anesthesiology Phone [phone] 03/28/2020 3:24 PM

## 2020-03-28 NOTE — Anesthesia Preprocedure Evaluation (Addendum)
Anesthesia Evaluation  Patient identified by MRN, date of birth, ID band Patient awake    Reviewed: Allergy & Precautions, NPO status , Patient's Chart, lab work & pertinent test results  Airway Mallampati: I  TM Distance: >3 FB Neck ROM: Full    Dental no notable dental hx. (+) Edentulous Upper, Edentulous Lower   Pulmonary neg pulmonary ROS, shortness of breath, asthma ,    Pulmonary exam normal breath sounds clear to auscultation       Cardiovascular hypertension, Pt. on medications negative cardio ROS Normal cardiovascular exam Rhythm:Regular Rate:Normal     Neuro/Psych  Headaches, Anxiety negative neurological ROS  negative psych ROS   GI/Hepatic negative GI ROS, Neg liver ROS, GERD  Medicated,  Endo/Other  negative endocrine ROSHypothyroidism   Renal/GU Renal diseasenegative Renal ROS  negative genitourinary   Musculoskeletal negative musculoskeletal ROS (+) Arthritis ,   Abdominal   Peds negative pediatric ROS (+)  Hematology negative hematology ROS (+) Lab Results      Component                Value               Date                      WBC                      6.9                 03/27/2020                HGB                      12.5                03/27/2020                HCT                      40.9                03/27/2020                MCV                      99.0                03/27/2020                PLT                      305                 03/27/2020              Anesthesia Other Findings   Reproductive/Obstetrics negative OB ROS                          Anesthesia Physical Anesthesia Plan  ASA: III  Anesthesia Plan: General   Post-op Pain Management:    Induction:   PONV Risk Score and Plan: 4 or greater and Treatment may vary due to age or medical condition, Ondansetron and Dexamethasone  Airway Management Planned: Oral ETT  Additional Equipment:    Intra-op Plan:   Post-operative Plan: Extubation in OR  Informed Consent: I have reviewed  the patients History and Physical, chart, labs and discussed the procedure including the risks, benefits and alternatives for the proposed anesthesia with the patient or authorized representative who has indicated his/her understanding and acceptance.     Dental advisory given  Plan Discussed with: CRNA and Surgeon  Anesthesia Plan Comments: (PAT note by Antionette Poles, PA-C: Mild intermittent asthmatic bronchitis, followed by PCP.  Maintained on Singulair.  Last seen by PCP 01/12/2020.  Doing well at that time, noted to be recovering well from recent C3-5 ACDF on 12/09/2019 by Dr. Lovell Sheehan.  History of large hiatal hernia. She also has history of esophageal ulcer; in 2012 her postop course after PLIF was complicated by hematemesis due to this.  She is on primidone for benign essential tremor.  CKD 4 followed by nephrologist Dr. Janit Pagan. Baseline creatinine 2.0-2.2 per PCP notes.  1.98 on preop labs.  Remainder of preop labs unremarkable.  EKG 03/27/20: Normal sinus rhythm. Rate 62. Inferior infarct , age undetermined. Doesn't appear significantly changed from prior.  CHEST2 VIEW 01/15/2017 (Care Everywhere): COMPARISON:03/29/2016   FINDINGS:  Heart size and vascularity normal. Lungs are clear without  infiltrate effusion or mass.   Hiatal hernia.Bilateral shoulder replacement.   IMPRESSION:  No active cardiopulmonary disease.   TTE 08/15/2016 (Care Everywhere): Summary  Ejection fraction is visually estimated at 55-60%  Normal left ventricular systolic function  Borderline left ventricular hypertrophy  Trace tricuspid regurgitation .   Stress echo 10/30/10 [facility]; scanned under Media tab, Correspondence dated 02/17/2011): Poor exercise tolerance. Low normal resting LV systolic function 55%. Hypertensive resting BP with normal response. LV function at peak exercise in not  hyperdynamic but there are no focal wall motion abnormaliies. Negative exercise treadmill test. Equivocal stress echo with no focal wall motion abnormality post exercise, but without hyperdynamic response.  )      Anesthesia Quick Evaluation

## 2020-03-29 ENCOUNTER — Inpatient Hospital Stay (HOSPITAL_COMMUNITY): Payer: Medicare HMO

## 2020-03-29 ENCOUNTER — Inpatient Hospital Stay (HOSPITAL_COMMUNITY)
Admission: RE | Admit: 2020-03-29 | Discharge: 2020-03-30 | DRG: 455 | Disposition: A | Discharge status: Home / Self Care | Payer: Medicare HMO | Attending: Neurosurgery | Admitting: Neurosurgery

## 2020-03-29 ENCOUNTER — Encounter (HOSPITAL_COMMUNITY)
Admission: RE | Disposition: A | Discharge status: Home / Self Care | Payer: Self-pay | Source: Home / Self Care | Attending: Neurosurgery

## 2020-03-29 ENCOUNTER — Inpatient Hospital Stay (HOSPITAL_COMMUNITY): Payer: Medicare HMO | Admitting: Anesthesiology

## 2020-03-29 ENCOUNTER — Other Ambulatory Visit: Payer: Self-pay

## 2020-03-29 ENCOUNTER — Inpatient Hospital Stay (HOSPITAL_COMMUNITY): Payer: Medicare HMO | Admitting: Physician Assistant

## 2020-03-29 ENCOUNTER — Encounter (HOSPITAL_COMMUNITY): Payer: Self-pay | Admitting: Neurosurgery

## 2020-03-29 DIAGNOSIS — J449 Chronic obstructive pulmonary disease, unspecified: Secondary | ICD-10-CM | POA: Diagnosis present

## 2020-03-29 DIAGNOSIS — M5136 Other intervertebral disc degeneration, lumbar region: Secondary | ICD-10-CM

## 2020-03-29 DIAGNOSIS — Z8249 Family history of ischemic heart disease and other diseases of the circulatory system: Secondary | ICD-10-CM

## 2020-03-29 DIAGNOSIS — Z79890 Hormone replacement therapy (postmenopausal): Secondary | ICD-10-CM | POA: Diagnosis not present

## 2020-03-29 DIAGNOSIS — I129 Hypertensive chronic kidney disease with stage 1 through stage 4 chronic kidney disease, or unspecified chronic kidney disease: Secondary | ICD-10-CM | POA: Diagnosis present

## 2020-03-29 DIAGNOSIS — M4316 Spondylolisthesis, lumbar region: Secondary | ICD-10-CM | POA: Diagnosis present

## 2020-03-29 DIAGNOSIS — E039 Hypothyroidism, unspecified: Secondary | ICD-10-CM | POA: Diagnosis present

## 2020-03-29 DIAGNOSIS — Z20822 Contact with and (suspected) exposure to covid-19: Secondary | ICD-10-CM | POA: Diagnosis present

## 2020-03-29 DIAGNOSIS — N183 Chronic kidney disease, stage 3 unspecified: Secondary | ICD-10-CM | POA: Diagnosis present

## 2020-03-29 DIAGNOSIS — Z79891 Long term (current) use of opiate analgesic: Secondary | ICD-10-CM

## 2020-03-29 DIAGNOSIS — Z825 Family history of asthma and other chronic lower respiratory diseases: Secondary | ICD-10-CM

## 2020-03-29 DIAGNOSIS — M5416 Radiculopathy, lumbar region: Secondary | ICD-10-CM | POA: Diagnosis present

## 2020-03-29 DIAGNOSIS — Z79899 Other long term (current) drug therapy: Secondary | ICD-10-CM | POA: Diagnosis not present

## 2020-03-29 DIAGNOSIS — G8929 Other chronic pain: Secondary | ICD-10-CM | POA: Diagnosis present

## 2020-03-29 DIAGNOSIS — M48061 Spinal stenosis, lumbar region without neurogenic claudication: Secondary | ICD-10-CM | POA: Diagnosis present

## 2020-03-29 DIAGNOSIS — M810 Age-related osteoporosis without current pathological fracture: Secondary | ICD-10-CM | POA: Diagnosis present

## 2020-03-29 DIAGNOSIS — Z811 Family history of alcohol abuse and dependence: Secondary | ICD-10-CM | POA: Diagnosis not present

## 2020-03-29 DIAGNOSIS — Z419 Encounter for procedure for purposes other than remedying health state, unspecified: Secondary | ICD-10-CM

## 2020-03-29 SURGERY — POSTERIOR LUMBAR FUSION 1 LEVEL
Anesthesia: General | Site: Spine Lumbar

## 2020-03-29 MED ORDER — ROCURONIUM BROMIDE 10 MG/ML (PF) SYRINGE
PREFILLED_SYRINGE | INTRAVENOUS | Status: AC
  Filled 2020-03-29: qty 10

## 2020-03-29 MED ORDER — SODIUM CHLORIDE 0.9% FLUSH: 3.0000 mL | Freq: Two times a day (BID) | INTRAVENOUS | Status: DC

## 2020-03-29 MED ORDER — DEXAMETHASONE SODIUM PHOSPHATE 10 MG/ML IJ SOLN
INTRAMUSCULAR | Status: AC
  Filled 2020-03-29: qty 1

## 2020-03-29 MED ORDER — BUPIVACAINE LIPOSOME 1.3 % IJ SUSP
20.0000 mL | Freq: Once | INTRAMUSCULAR | Status: DC
  Filled 2020-03-29: qty 20

## 2020-03-29 MED ORDER — ACETAMINOPHEN 500 MG PO TABS
1000.0000 mg | ORAL_TABLET | Freq: Four times a day (QID) | ORAL | Status: DC
  Administered 2020-03-29 – 2020-03-30 (×3): 1000 mg via ORAL
  Filled 2020-03-29 (×3): qty 2

## 2020-03-29 MED ORDER — THROMBIN 5000 UNITS EX SOLR
OROMUCOSAL | Status: DC | PRN
  Administered 2020-03-29 (×2): 5 mL via TOPICAL

## 2020-03-29 MED ORDER — BACITRACIN ZINC 500 UNIT/GM EX OINT
TOPICAL_OINTMENT | CUTANEOUS | Status: DC | PRN
  Administered 2020-03-29: 1 via TOPICAL

## 2020-03-29 MED ORDER — MENTHOL 3 MG MT LOZG: 1.0000 | LOZENGE | OROMUCOSAL | Status: DC | PRN

## 2020-03-29 MED ORDER — THROMBIN 5000 UNITS EX SOLR
CUTANEOUS | Status: AC
  Filled 2020-03-29: qty 5000

## 2020-03-29 MED ORDER — CHLORHEXIDINE GLUCONATE 0.12 % MT SOLN: 15.0000 mL | Freq: Once | OROMUCOSAL | Status: AC

## 2020-03-29 MED ORDER — PRIMIDONE 50 MG PO TABS: 50.0000 mg | ORAL_TABLET | Freq: Every day | ORAL | Status: DC

## 2020-03-29 MED ORDER — OXYCODONE HCL 5 MG/5ML PO SOLN: 5.0000 mg | Freq: Once | ORAL | Status: DC | PRN

## 2020-03-29 MED ORDER — DROPERIDOL 2.5 MG/ML IJ SOLN
INTRAMUSCULAR | Status: AC
  Filled 2020-03-29: qty 2

## 2020-03-29 MED ORDER — SODIUM CHLORIDE 0.9% FLUSH: 3.0000 mL | INTRAVENOUS | Status: DC | PRN

## 2020-03-29 MED ORDER — DOCUSATE SODIUM 100 MG PO CAPS
100.0000 mg | ORAL_CAPSULE | Freq: Two times a day (BID) | ORAL | Status: DC
  Administered 2020-03-29: 100 mg via ORAL
  Filled 2020-03-29: qty 1

## 2020-03-29 MED ORDER — MEPERIDINE HCL 25 MG/ML IJ SOLN: 6.2500 mg | INTRAMUSCULAR | Status: DC | PRN

## 2020-03-29 MED ORDER — PROPOFOL 10 MG/ML IV BOLUS
INTRAVENOUS | Status: DC | PRN
  Administered 2020-03-29: 90 mg via INTRAVENOUS
  Administered 2020-03-29: 20 mg via INTRAVENOUS
  Administered 2020-03-29: 10 mg via INTRAVENOUS

## 2020-03-29 MED ORDER — ONDANSETRON HCL 4 MG/2ML IJ SOLN
INTRAMUSCULAR | Status: DC | PRN
  Administered 2020-03-29: 4 mg via INTRAVENOUS

## 2020-03-29 MED ORDER — HYDROCODONE-ACETAMINOPHEN 10-325 MG PO TABS: 1.0000 | ORAL_TABLET | ORAL | Status: DC | PRN

## 2020-03-29 MED ORDER — ACETAMINOPHEN 10 MG/ML IV SOLN
INTRAVENOUS | Status: AC
  Filled 2020-03-29: qty 100

## 2020-03-29 MED ORDER — ONDANSETRON HCL 4 MG PO TABS: 4.0000 mg | ORAL_TABLET | Freq: Four times a day (QID) | ORAL | Status: DC | PRN

## 2020-03-29 MED ORDER — HYDROMORPHONE HCL 1 MG/ML IJ SOLN
INTRAMUSCULAR | Status: AC
  Filled 2020-03-29: qty 1

## 2020-03-29 MED ORDER — SODIUM CHLORIDE 0.9 % IV SOLN
INTRAVENOUS | Status: DC | PRN
  Administered 2020-03-29: 500 mL

## 2020-03-29 MED ORDER — ALBUTEROL SULFATE (2.5 MG/3ML) 0.083% IN NEBU: 2.5000 mg | INHALATION_SOLUTION | Freq: Four times a day (QID) | RESPIRATORY_TRACT | Status: DC | PRN

## 2020-03-29 MED ORDER — MONTELUKAST SODIUM 10 MG PO TABS: 10.0000 mg | ORAL_TABLET | Freq: Every day | ORAL | Status: DC

## 2020-03-29 MED ORDER — FENTANYL CITRATE (PF) 250 MCG/5ML IJ SOLN
INTRAMUSCULAR | Status: AC
  Filled 2020-03-29: qty 5

## 2020-03-29 MED ORDER — ACETAMINOPHEN 325 MG PO TABS: 650.0000 mg | ORAL_TABLET | ORAL | Status: DC | PRN

## 2020-03-29 MED ORDER — OXYCODONE HCL 5 MG/5ML PO SOLN: 5.0000 mg | Freq: Once | ORAL | Status: AC | PRN

## 2020-03-29 MED ORDER — CHLORHEXIDINE GLUCONATE 0.12 % MT SOLN
15.0000 mL | Freq: Once | OROMUCOSAL | Status: DC
Start: 2020-03-29 — End: 2020-03-29

## 2020-03-29 MED ORDER — BACITRACIN ZINC 500 UNIT/GM EX OINT
TOPICAL_OINTMENT | CUTANEOUS | Status: AC
  Filled 2020-03-29: qty 28.35

## 2020-03-29 MED ORDER — OXYCODONE HCL 5 MG PO TABS
10.0000 mg | ORAL_TABLET | ORAL | Status: DC | PRN
  Administered 2020-03-29 – 2020-03-30 (×4): 10 mg via ORAL
  Filled 2020-03-29 (×4): qty 2

## 2020-03-29 MED ORDER — ACETAMINOPHEN 10 MG/ML IV SOLN
1000.0000 mg | Freq: Once | INTRAVENOUS | Status: DC | PRN
  Administered 2020-03-29: 1000 mg via INTRAVENOUS

## 2020-03-29 MED ORDER — SUGAMMADEX SODIUM 200 MG/2ML IV SOLN
INTRAVENOUS | Status: DC | PRN
  Administered 2020-03-29: 200 mg via INTRAVENOUS

## 2020-03-29 MED ORDER — ACETAMINOPHEN 650 MG RE SUPP: 650.0000 mg | RECTAL | Status: DC | PRN

## 2020-03-29 MED ORDER — HYDROMORPHONE HCL 1 MG/ML IJ SOLN
INTRAMUSCULAR | Status: AC
  Administered 2020-03-29: 0.5 mg via INTRAVENOUS
  Filled 2020-03-29: qty 1

## 2020-03-29 MED ORDER — ACETAMINOPHEN 325 MG PO TABS: 325.0000 mg | ORAL_TABLET | ORAL | Status: DC | PRN

## 2020-03-29 MED ORDER — LACTATED RINGERS IV SOLN: INTRAVENOUS | Status: DC

## 2020-03-29 MED ORDER — FENTANYL CITRATE (PF) 100 MCG/2ML IJ SOLN: 25.0000 ug | INTRAMUSCULAR | Status: DC | PRN

## 2020-03-29 MED ORDER — ORAL CARE MOUTH RINSE
15.0000 mL | Freq: Once | OROMUCOSAL | Status: DC
Start: 2020-03-29 — End: 2020-03-29

## 2020-03-29 MED ORDER — BUPIVACAINE-EPINEPHRINE 0.5% -1:200000 IJ SOLN
INTRAMUSCULAR | Status: AC
  Filled 2020-03-29: qty 1

## 2020-03-29 MED ORDER — BUPIVACAINE LIPOSOME 1.3 % IJ SUSP
INTRAMUSCULAR | Status: DC | PRN
  Administered 2020-03-29: 20 mL

## 2020-03-29 MED ORDER — MORPHINE SULFATE (PF) 4 MG/ML IV SOLN: 4.0000 mg | INTRAVENOUS | Status: DC | PRN

## 2020-03-29 MED ORDER — CHLORHEXIDINE GLUCONATE CLOTH 2 % EX PADS: 6.0000 | MEDICATED_PAD | Freq: Once | CUTANEOUS | Status: DC

## 2020-03-29 MED ORDER — BUPIVACAINE-EPINEPHRINE (PF) 0.5% -1:200000 IJ SOLN
INTRAMUSCULAR | Status: DC | PRN
  Administered 2020-03-29: 10 mL via PERINEURAL

## 2020-03-29 MED ORDER — CEFAZOLIN SODIUM-DEXTROSE 2-4 GM/100ML-% IV SOLN
2.0000 g | Freq: Three times a day (TID) | INTRAVENOUS | Status: AC
  Administered 2020-03-29 – 2020-03-30 (×2): 2 g via INTRAVENOUS
  Filled 2020-03-29 (×2): qty 100

## 2020-03-29 MED ORDER — 0.9 % SODIUM CHLORIDE (POUR BTL) OPTIME
TOPICAL | Status: DC | PRN
  Administered 2020-03-29: 1000 mL

## 2020-03-29 MED ORDER — SODIUM CHLORIDE 0.9 % IV SOLN: INTRAVENOUS | Status: DC

## 2020-03-29 MED ORDER — LORATADINE 10 MG PO TABS: 10.0000 mg | ORAL_TABLET | Freq: Every day | ORAL | Status: DC

## 2020-03-29 MED ORDER — CEFAZOLIN SODIUM-DEXTROSE 2-4 GM/100ML-% IV SOLN
2.0000 g | INTRAVENOUS | Status: AC
  Administered 2020-03-29: 2 g via INTRAVENOUS

## 2020-03-29 MED ORDER — OXYCODONE HCL 5 MG PO TABS
ORAL_TABLET | ORAL | Status: AC
  Filled 2020-03-29: qty 1

## 2020-03-29 MED ORDER — CITALOPRAM HYDROBROMIDE 20 MG PO TABS: 10.0000 mg | ORAL_TABLET | Freq: Every day | ORAL | Status: DC

## 2020-03-29 MED ORDER — FAMOTIDINE 20 MG PO TABS
20.0000 mg | ORAL_TABLET | Freq: Every day | ORAL | Status: DC
  Administered 2020-03-29: 20 mg via ORAL
  Filled 2020-03-29: qty 1

## 2020-03-29 MED ORDER — CEFAZOLIN SODIUM-DEXTROSE 2-4 GM/100ML-% IV SOLN
INTRAVENOUS | Status: AC
  Filled 2020-03-29: qty 100

## 2020-03-29 MED ORDER — CHLORHEXIDINE GLUCONATE 0.12 % MT SOLN
OROMUCOSAL | Status: AC
  Administered 2020-03-29: 15 mL via OROMUCOSAL
  Filled 2020-03-29: qty 15

## 2020-03-29 MED ORDER — OXYCODONE HCL 5 MG PO TABS: 5.0000 mg | ORAL_TABLET | ORAL | Status: DC | PRN

## 2020-03-29 MED ORDER — ZOLPIDEM TARTRATE 5 MG PO TABS: 5.0000 mg | ORAL_TABLET | Freq: Every evening | ORAL | Status: DC | PRN

## 2020-03-29 MED ORDER — SODIUM CHLORIDE 0.9 % IV SOLN: 250.0000 mL | INTRAVENOUS | Status: DC

## 2020-03-29 MED ORDER — HYDROMORPHONE HCL 1 MG/ML IJ SOLN
0.2500 mg | INTRAMUSCULAR | Status: DC | PRN
  Administered 2020-03-29 (×2): 0.5 mg via INTRAVENOUS

## 2020-03-29 MED ORDER — LEVOTHYROXINE SODIUM 25 MCG PO TABS
50.0000 ug | ORAL_TABLET | Freq: Every day | ORAL | Status: DC
  Administered 2020-03-30: 50 ug via ORAL
  Filled 2020-03-29: qty 2

## 2020-03-29 MED ORDER — ONDANSETRON HCL 4 MG/2ML IJ SOLN: 4.0000 mg | Freq: Once | INTRAMUSCULAR | Status: DC | PRN

## 2020-03-29 MED ORDER — ONDANSETRON HCL 4 MG/2ML IJ SOLN: 4.0000 mg | Freq: Four times a day (QID) | INTRAMUSCULAR | Status: DC | PRN

## 2020-03-29 MED ORDER — LOSARTAN POTASSIUM 50 MG PO TABS: 50.0000 mg | ORAL_TABLET | Freq: Every day | ORAL | Status: DC

## 2020-03-29 MED ORDER — PANTOPRAZOLE SODIUM 40 MG PO TBEC: 40.0000 mg | DELAYED_RELEASE_TABLET | Freq: Every day | ORAL | Status: DC

## 2020-03-29 MED ORDER — TIZANIDINE HCL 4 MG PO TABS
4.0000 mg | ORAL_TABLET | Freq: Four times a day (QID) | ORAL | Status: DC | PRN
  Administered 2020-03-29 – 2020-03-30 (×2): 4 mg via ORAL
  Filled 2020-03-29 (×2): qty 1

## 2020-03-29 MED ORDER — BISACODYL 10 MG RE SUPP: 10.0000 mg | Freq: Every day | RECTAL | Status: DC | PRN

## 2020-03-29 MED ORDER — ROCURONIUM BROMIDE 10 MG/ML (PF) SYRINGE
PREFILLED_SYRINGE | INTRAVENOUS | Status: DC | PRN
  Administered 2020-03-29: 50 mg via INTRAVENOUS
  Administered 2020-03-29: 40 mg via INTRAVENOUS

## 2020-03-29 MED ORDER — VITAMIN D (ERGOCALCIFEROL) 1.25 MG (50000 UNIT) PO CAPS: 50000.0000 [IU] | ORAL_CAPSULE | ORAL | Status: DC

## 2020-03-29 MED ORDER — ACETAMINOPHEN 160 MG/5ML PO SOLN: 325.0000 mg | ORAL | Status: DC | PRN

## 2020-03-29 MED ORDER — FENTANYL CITRATE (PF) 100 MCG/2ML IJ SOLN
INTRAMUSCULAR | Status: DC | PRN
  Administered 2020-03-29: 50 ug via INTRAVENOUS
  Administered 2020-03-29: 100 ug via INTRAVENOUS
  Administered 2020-03-29 (×6): 50 ug via INTRAVENOUS

## 2020-03-29 MED ORDER — ORAL CARE MOUTH RINSE: 15.0000 mL | Freq: Once | OROMUCOSAL | Status: AC

## 2020-03-29 MED ORDER — FERROUS SULFATE 325 (65 FE) MG PO TABS: 325.0000 mg | ORAL_TABLET | Freq: Every day | ORAL | Status: DC

## 2020-03-29 MED ORDER — OXYCODONE HCL 5 MG PO TABS
5.0000 mg | ORAL_TABLET | Freq: Once | ORAL | Status: AC | PRN
  Administered 2020-03-29: 5 mg via ORAL

## 2020-03-29 MED ORDER — PHENYLEPHRINE HCL-NACL 10-0.9 MG/250ML-% IV SOLN
INTRAVENOUS | Status: DC | PRN
Start: 2020-03-29 — End: 2020-03-29
  Administered 2020-03-29: 25 ug/min via INTRAVENOUS

## 2020-03-29 MED ORDER — LIDOCAINE 2% (20 MG/ML) 5 ML SYRINGE
INTRAMUSCULAR | Status: AC
  Filled 2020-03-29: qty 5

## 2020-03-29 MED ORDER — PHENOL 1.4 % MT LIQD: 1.0000 | OROMUCOSAL | Status: DC | PRN

## 2020-03-29 MED ORDER — OXYCODONE HCL 5 MG PO TABS: 5.0000 mg | ORAL_TABLET | Freq: Once | ORAL | Status: DC | PRN

## 2020-03-29 MED ORDER — LIDOCAINE 2% (20 MG/ML) 5 ML SYRINGE
INTRAMUSCULAR | Status: DC | PRN
  Administered 2020-03-29: 80 mg via INTRAVENOUS

## 2020-03-29 MED ORDER — DROPERIDOL 2.5 MG/ML IJ SOLN
0.6250 mg | Freq: Once | INTRAMUSCULAR | Status: AC | PRN
  Administered 2020-03-29: 0.625 mg via INTRAVENOUS

## 2020-03-29 MED ORDER — PROPOFOL 10 MG/ML IV BOLUS
INTRAVENOUS | Status: AC
  Filled 2020-03-29: qty 20

## 2020-03-29 MED ORDER — THROMBIN 20000 UNITS EX SOLR
CUTANEOUS | Status: AC
  Filled 2020-03-29: qty 20000

## 2020-03-29 MED ORDER — DEXAMETHASONE SODIUM PHOSPHATE 10 MG/ML IJ SOLN
INTRAMUSCULAR | Status: DC | PRN
Start: 2020-03-29 — End: 2020-03-29
  Administered 2020-03-29: 10 mg via INTRAVENOUS

## 2020-03-29 MED ORDER — FAMOTIDINE 20 MG PO TABS: 40.0000 mg | ORAL_TABLET | Freq: Two times a day (BID) | ORAL | Status: DC

## 2020-03-29 MED ORDER — ONDANSETRON HCL 4 MG/2ML IJ SOLN
INTRAMUSCULAR | Status: AC
  Filled 2020-03-29: qty 2

## 2020-03-29 MED ORDER — ALBUTEROL SULFATE HFA 108 (90 BASE) MCG/ACT IN AERS: 1.0000 | INHALATION_SPRAY | Freq: Four times a day (QID) | RESPIRATORY_TRACT | Status: DC | PRN

## 2020-03-29 SURGICAL SUPPLY — 77 items
BAG DECANTER FOR FLEXI CONT (MISCELLANEOUS) ×3 IMPLANT
BASKET BONE COLLECTION (BASKET) ×3 IMPLANT
BENZOIN TINCTURE PRP APPL 2/3 (GAUZE/BANDAGES/DRESSINGS) ×3 IMPLANT
BLADE CLIPPER SURG (BLADE) IMPLANT
BUR MATCHSTICK NEURO 3.0 LAGG (BURR) ×3 IMPLANT
BUR PRECISION FLUTE 6.0 (BURR) ×3 IMPLANT
CAGE ALTERA 10X31MM-10-14-15 (Cage) ×1 IMPLANT
CAGE ALTERA 10X31X10-14 15D (Cage) ×2 IMPLANT
CANISTER SUCT 3000ML PPV (MISCELLANEOUS) ×3 IMPLANT
CARTRIDGE OIL MAESTRO DRILL (MISCELLANEOUS) ×1 IMPLANT
CLOSURE WOUND 1/2 X4 (GAUZE/BANDAGES/DRESSINGS) ×1
CNTNR URN SCR LID CUP LEK RST (MISCELLANEOUS) ×1 IMPLANT
CONT SPEC 4OZ STRL OR WHT (MISCELLANEOUS) ×2
COVER BACK TABLE 60X90IN (DRAPES) ×3 IMPLANT
COVER WAND RF STERILE (DRAPES) ×3 IMPLANT
DECANTER SPIKE VIAL GLASS SM (MISCELLANEOUS) ×3 IMPLANT
DERMABOND ADVANCED (GAUZE/BANDAGES/DRESSINGS) ×2
DERMABOND ADVANCED .7 DNX12 (GAUZE/BANDAGES/DRESSINGS) ×1 IMPLANT
DIFFUSER DRILL AIR PNEUMATIC (MISCELLANEOUS) ×3 IMPLANT
DRAPE C-ARM 42X72 X-RAY (DRAPES) ×6 IMPLANT
DRAPE HALF SHEET 40X57 (DRAPES) ×3 IMPLANT
DRAPE LAPAROTOMY 100X72X124 (DRAPES) ×3 IMPLANT
DRAPE SURG 17X23 STRL (DRAPES) ×12 IMPLANT
DRSG OPSITE POSTOP 4X8 (GAUZE/BANDAGES/DRESSINGS) ×3 IMPLANT
ELECT BLADE 4.0 EZ CLEAN MEGAD (MISCELLANEOUS) ×3
ELECT REM PT RETURN 9FT ADLT (ELECTROSURGICAL) ×3
ELECTRODE BLDE 4.0 EZ CLN MEGD (MISCELLANEOUS) ×1 IMPLANT
ELECTRODE REM PT RTRN 9FT ADLT (ELECTROSURGICAL) ×1 IMPLANT
EVACUATOR 1/8 PVC DRAIN (DRAIN) IMPLANT
GAUZE 4X4 16PLY RFD (DISPOSABLE) ×3 IMPLANT
GAUZE SPONGE 4X4 12PLY STRL (GAUZE/BANDAGES/DRESSINGS) IMPLANT
GLOVE BIO SURGEON STRL SZ8 (GLOVE) ×6 IMPLANT
GLOVE BIO SURGEON STRL SZ8.5 (GLOVE) ×6 IMPLANT
GLOVE BIOGEL PI IND STRL 7.5 (GLOVE) ×1 IMPLANT
GLOVE BIOGEL PI IND STRL 8 (GLOVE) ×2 IMPLANT
GLOVE BIOGEL PI INDICATOR 7.5 (GLOVE) ×2
GLOVE BIOGEL PI INDICATOR 8 (GLOVE) ×4
GLOVE EXAM NITRILE XL STR (GLOVE) IMPLANT
GLOVE SURG SS PI 7.0 STRL IVOR (GLOVE) ×12 IMPLANT
GOWN STRL REUS W/ TWL LRG LVL3 (GOWN DISPOSABLE) ×1 IMPLANT
GOWN STRL REUS W/ TWL XL LVL3 (GOWN DISPOSABLE) ×3 IMPLANT
GOWN STRL REUS W/TWL 2XL LVL3 (GOWN DISPOSABLE) IMPLANT
GOWN STRL REUS W/TWL LRG LVL3 (GOWN DISPOSABLE) ×2
GOWN STRL REUS W/TWL XL LVL3 (GOWN DISPOSABLE) ×6
HEMOSTAT POWDER KIT SURGIFOAM (HEMOSTASIS) ×6 IMPLANT
KIT BASIN OR (CUSTOM PROCEDURE TRAY) ×3 IMPLANT
KIT INFUSE SMALL (Orthopedic Implant) ×3 IMPLANT
KIT TURNOVER KIT B (KITS) ×3 IMPLANT
MILL MEDIUM DISP (BLADE) IMPLANT
NEEDLE HYPO 21X1.5 SAFETY (NEEDLE) ×3 IMPLANT
NEEDLE HYPO 22GX1.5 SAFETY (NEEDLE) ×3 IMPLANT
NS IRRIG 1000ML POUR BTL (IV SOLUTION) ×3 IMPLANT
OIL CARTRIDGE MAESTRO DRILL (MISCELLANEOUS) ×3
PACK LAMINECTOMY NEURO (CUSTOM PROCEDURE TRAY) ×3 IMPLANT
PAD ARMBOARD 7.5X6 YLW CONV (MISCELLANEOUS) IMPLANT
PATTIES SURGICAL .5 X.5 (GAUZE/BANDAGES/DRESSINGS) ×3 IMPLANT
PATTIES SURGICAL .5 X1 (DISPOSABLE) IMPLANT
PATTIES SURGICAL 1X1 (DISPOSABLE) ×3 IMPLANT
PUTTY DBM 10CC CALC GRAN (Putty) ×3 IMPLANT
ROD EXPEDIUM PRE BENT 55MM (Rod) ×6 IMPLANT
SCREW CORT FIX FEN 5.5X7X55MM (Screw) ×6 IMPLANT
SCREW SET SINGLE INNER (Screw) ×18 IMPLANT
SCREW VIPER 8X50MM (Screw) ×12 IMPLANT
SPONGE LAP 4X18 RFD (DISPOSABLE) IMPLANT
SPONGE NEURO XRAY DETECT 1X3 (DISPOSABLE) IMPLANT
SPONGE SURGIFOAM ABS GEL 100 (HEMOSTASIS) IMPLANT
STRIP CLOSURE SKIN 1/2X4 (GAUZE/BANDAGES/DRESSINGS) ×2 IMPLANT
SUT VIC AB 1 CT1 18XBRD ANBCTR (SUTURE) ×1 IMPLANT
SUT VIC AB 1 CT1 8-18 (SUTURE) ×2
SUT VIC AB 2-0 CP2 18 (SUTURE) ×3 IMPLANT
SYR 20ML LL LF (SYRINGE) ×3 IMPLANT
TAP EXPEDIUM DL 7.0 (INSTRUMENTS) ×2
TAP EXPEDIUM DL 7X2 (INSTRUMENTS) ×1 IMPLANT
TOWEL GREEN STERILE (TOWEL DISPOSABLE) ×3 IMPLANT
TOWEL GREEN STERILE FF (TOWEL DISPOSABLE) ×3 IMPLANT
TRAY FOLEY MTR SLVR 16FR STAT (SET/KITS/TRAYS/PACK) ×3 IMPLANT
WATER STERILE IRR 1000ML POUR (IV SOLUTION) ×3 IMPLANT

## 2020-03-29 NOTE — Anesthesia Procedure Notes (Signed)
Procedure Name: Intubation Date/Time: 03/29/2020 12:00 PM Performed by: Babs Bertin, CRNA Pre-anesthesia Checklist: Patient identified, Emergency Drugs available, Suction available and Patient being monitored Patient Re-evaluated:Patient Re-evaluated prior to induction Oxygen Delivery Method: Circle System Utilized Preoxygenation: Pre-oxygenation with 100% oxygen Induction Type: IV induction Ventilation: Mask ventilation without difficulty Laryngoscope Size: Mac and 3 Grade View: Grade I Tube type: Oral Tube size: 7.0 mm Number of attempts: 1 Airway Equipment and Method: Stylet and Oral airway Placement Confirmation: ETT inserted through vocal cords under direct vision,  positive ETCO2 and breath sounds checked- equal and bilateral Secured at: 21 cm Tube secured with: Tape Dental Injury: Teeth and Oropharynx as per pre-operative assessment

## 2020-03-29 NOTE — Progress Notes (Signed)
Subjective: The patient is somnolent but easily arousable.  She is in no apparent distress.  Objective: Vital signs in last 24 hours: Temp:  [97.7 F (36.5 C)-98.3 F (36.8 C)] 97.7 F (36.5 C) (07/21 1640) Pulse Rate:  [66-82] 77 (07/21 1710) Resp:  [12-19] 19 (07/21 1710) BP: (119-190)/(66-93) 129/66 (07/21 1710) SpO2:  [97 %-98 %] 98 % (07/21 1710) Weight:  [84.8 kg] 84.8 kg (07/21 1059) Estimated body mass index is 32.1 kg/m as calculated from the following:   Height as of this encounter: 5\' 4"  (1.626 m).   Weight as of this encounter: 84.8 kg.   Intake/Output from previous day: No intake/output data recorded. Intake/Output this shift: Total I/O In: 1800 [I.V.:1700; IV Piggyback:100] Out: 740 [Urine:440; Blood:300]  Physical exam the patient is somnolent but arousable.  She is moving her lower extremities well.  Lab Results: Recent Labs    03/27/20 1324  WBC 6.9  HGB 12.5  HCT 40.9  PLT 305   BMET Recent Labs    03/27/20 1324  NA 138  K 4.3  CL 103  CO2 26  GLUCOSE 83  BUN 12  CREATININE 1.98*  CALCIUM 9.4    Studies/Results: No results found.  Assessment/Plan: The patient is doing well.  I spoke with her daughter.  LOS: 0 days     03/29/20 03/29/2020, 5:27 PM

## 2020-03-29 NOTE — Op Note (Signed)
Brief history: The patient is a 74 year old white female who has had several previous back surgeries including an instrumented fusion at L3-4 by another physician.  The patient has complained of chronic back pain.  She has failed medical management.  She was worked up with a lumbar MRI and lumbar x-rays which demonstrated an L4-5 spondylolisthesis.  I discussed the various treatment options with her.  She has weighed the risks, benefits and alternatives surgery and decided proceed with an L4-5 decompression, instrumentation and fusion.  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 Etc. requiring a wide decompression of the nerve roots.);  L4-5 transforaminal lumbar interbody fusion with bone morphogenic protein soaked collagen sponges local morselized autograft bone and Zimmer DBM; insertion of interbody prosthesis at L4-5 (globus peek expandable interbody prosthesis); posterior segmental instrumentation from L3 to L5 with DePuy Viper titanium pedicle screws and rods; posterior lateral arthrodesis at L4-5 and redo L3-4 posterior lateral arthrodesis with bone morphogenic-protein soaked collagen sponges, local morselized autograft bone and Zimmer DBM; exploration of lumbar fusion/removal of lumbar hardware.  Surgeon: Dr. Delma Officer  Asst.: Hildred Priest, NP  Anesthesia: Gen. endotracheal  Estimated blood loss: 250 cc  Drains: None  Complications: None  Description of procedure: The patient was brought to the operating room by the anesthesia team. General endotracheal anesthesia was  induced. The patient was turned to the prone position on the Wilson frame. The patient's lumbosacral region was then prepared with Betadine scrub and Betadine solution. Sterile drapes were applied.  I then injected the area to be incised with Marcaine with epinephrine solution. I then used the scalpel to make a linear midline incision over the L3-4 and L4-5 interspace. I then used electrocautery to perform a bilateral subperiosteal dissection exposing the facets bilaterally at L3-4 and L4-5 and exposing the old hardware at L3-4. We then inserted the Verstrac retractor to provide exposure.  We explored the fusion by removing the caps from the old screws and then removing the rods.  The bottom screws, i.e. the L4 screws were loose bilaterally indicative of a pseudoarthrosis.  We therefore removed the old screws bilaterally at L3 and L4.  I began the decompression by using the high speed drill to perform laminotomies at L4-5 bilaterally.  There was quite a bit of scar tissue.  We carefully dissected through the scar tissue and identified the dura.  We then used the Kerrison punches to widen the previous laminectomy and removed the ligamentum flavum and scar tissue at L4-5. We used the Kerrison punches to remove the medial facets at L4-5 bilaterally. We performed wide foraminotomies about the bilateral L4 and L5 nerve roots completing the decompression.  We now turned our attention to the posterior lumbar interbody fusion. I used a scalpel to incise the intervertebral disc at L4-5 bilaterally. I then performed a partial intervertebral discectomy at L4-5 bilaterally using the pituitary forceps. We prepared the vertebral endplates at L4-5 bilaterally for the fusion by removing the soft tissues with the curettes. We then used the trial spacers to pick the appropriate sized interbody prosthesis. We prefilled his prosthesis with a combination of bone morphogenic protein soaked collagen sponges, local morselized  autograft bone that we obtained  during the decompression as well as Zimmer DBM. We inserted the prefilled prosthesis into the interspace at L4-5 from the right, we then turned and expanded the prosthesis. There was a good snug fit of the prosthesis in the interspace. We then filled and the remainder of the intervertebral disc space with local morselized autograft bone and Zimmer DBM. This completed the posterior lumbar interbody arthrodesis.  During the decompression and insertion of the prosthesis the assistant protected the thecal sac and nerve roots with the D'Errico retractor.  We now turned attention to the instrumentation.  We used electrocautery to remove the soft tissue around the old screw holes at L3 and L4.  Under fluoroscopic guidance we cannulated the bilateral L5 pedicles with the bone probe. We then removed the bone probe. We then tapped the pedicle with a 7.0 millimeter tap. We then removed the tap. We probed inside the tapped pedicle with a ball probe to rule out cortical breaches. We then inserted a 8.0 x 50 millimeter pedicle screw into the L5 pedicles bilaterally under fluoroscopic guidance.  We replaced the loose screws at L3 bilaterally with 7 x 55 mm screws and at L4 bilaterally with 8 x 50 screws we then palpated along the medial aspect of the pedicles to rule out cortical breaches. There were none. The nerve roots were not injured. We then connected the unilateral pedicle screws with a lordotic rod.  We then tightened the caps appropriately. This completed the instrumentation from L3-L5 bilaterally.  We now turned our attention to the posterior lateral arthrodesis at L4-5 and the redo arthrodesis at L3-4.  We remove the soft tissues and scar tissue and L3-4.Marland Kitchen We used the high-speed drill to decorticate the remainder of the facets, pars, transverse process at L3-4 and L4-5 bilaterally. We then applied a combination of bone morphogenic protein soaked collagen sponges, local morselized  autograft bone and Zimmer DBM over these decorticated posterior lateral structures. This completed the posterior lateral arthrodesis and L3-4 and L4-5 bilaterally.  We then obtained hemostasis using bipolar electrocautery. We irrigated the wound out with bacitracin solution. We inspected the thecal sac and nerve roots and noted they were well decompressed. We then removed the retractor.  We injected Exparel . We reapproximated patient's thoracolumbar fascia with interrupted #1 Vicryl suture. We reapproximated patient's subcutaneous tissue with interrupted 2-0 Vicryl suture. The reapproximated patient's skin with Steri-Strips and benzoin. The wound was then coated with bacitracin ointment. A sterile dressing was applied. The drapes were removed. The patient was subsequently returned to the supine position where they were extubated by the anesthesia team. He was then transported to the post anesthesia care unit in stable condition. All sponge instrument and needle counts were reportedly correct at the end of this case.

## 2020-03-29 NOTE — Anesthesia Postprocedure Evaluation (Signed)
Anesthesia Post Note  Patient: Jade Walker  Procedure(s) Performed: POSTERIOR LUMBAR INTERBODY FUSION  LUMBAR FOUR- LUMBAR FIVE WITH REVISION OF LUMBAR THREE-FOUR FUSION (N/A Spine Lumbar)     Patient location during evaluation: PACU Anesthesia Type: General Level of consciousness: awake and alert Pain management: pain level controlled Vital Signs Assessment: post-procedure vital signs reviewed and stable Respiratory status: spontaneous breathing, nonlabored ventilation, respiratory function stable and patient connected to nasal cannula oxygen Cardiovascular status: blood pressure returned to baseline and stable Postop Assessment: no apparent nausea or vomiting Anesthetic complications: no   No complications documented.  Last Vitals:  Vitals:   03/29/20 1135 03/29/20 1640  BP: (!) 189/93   Pulse:  (P) 82  Resp:  (P) 12  Temp:  (P) 36.5 C  SpO2:      Last Pain:  Vitals:   03/29/20 1125  TempSrc:   PainSc: 4                  [name]

## 2020-03-29 NOTE — Progress Notes (Signed)
Orthopedic Tech Progress Note Patient Details:  Jade Walker [birth year] [id number] Fitted patient with BACK BRACE Patient ID: [name], female   DOB: December 16, 1945, 74 y.o.   MRN: [id number]   Donald Pore 03/29/2020, 5:52 PM

## 2020-03-29 NOTE — H&P (Signed)
Subjective: The patient is a 74 year old white female who has had a previous L3-4 decompression, instrumentation and fusion by Dr. Phoebe Perch in 2012.  She has developed recurrent back and leg pain.  She has failed medical management.  He was worked up with a lumbar MRI and lumbar x-rays which demonstrated an L4-5 spinal listhesis, facet arthropathy, and spinal stenosis.  I discussed the various treatment options with her.  She has decided proceed with surgery after weighing the risk, benefits and alternatives  The patient's had a C3-4, C4-5 and C5-6 anterior cervical discectomy, fusion and plating by me on 12/09/2019.  Past Medical History:  Diagnosis Date  . Anxiety   . 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 Asthma   . CKD (chronic kidney disease), stage III   . Complication of anesthesia    blood loss after back surgery because hernia was scratched and "torn" during intubation (02/15/11 EGD showed GIB d/t esophageal ulcer)  . COPD (chronic obstructive pulmonary disease) (HCC)   . Environmental and seasonal allergies   . Full dentures   . GERD (gastroesophageal reflux disease)   . Headache    sinus  . History of hiatal hernia   . History of kidney stones   . History of stress test 2012   pt. reports it was resulted normal-HPR  . Hypertension   . Hypothyroidism   . Iron deficiency anemia    from Care Everywhere  . Osteoporosis   . Pneumonia   . Shortness of breath dyspnea    climbing steps  . Stress incontinence    from Care Everywhere    Past Surgical History:  Procedure Laterality Date  . ABDOMINAL HYSTERECTOMY    . BACK SURGERY     lumbar x 3, 1 thoracic spine  . CHOLECYSTECTOMY    . COLONOSCOPY    . EYE SURGERY Bilateral    cataract surgery with lens implant  . FRACTURE SURGERY    . HERNIA REPAIR     hiatal hernia "removed"- HPR, 1990  . JOINT REPLACEMENT Left 04/2105   knee  . OPEN REDUCTION INTERNAL FIXATION (ORIF) DISTAL RADIAL FRACTURE  Left 03/14/2015   Procedure: OPEN REDUCTION INTERNAL FIXATION (ORIF) LEFT DISTAL RADIUS FRACTURE;  Surgeon: Cammy Copa, MD;  Location: [facility] OR;  Service: Orthopedics;  Laterality: Left;  . TONSILLECTOMY    . TOTAL KNEE ARTHROPLASTY Left 04/18/2015   Procedure: TOTAL KNEE ARTHROPLASTY;  Surgeon: Cammy Copa, MD;  Location: [facility] OR;  Service: Orthopedics;  Laterality: Left;  . TOTAL KNEE ARTHROPLASTY Right 06/10/2017  . TOTAL KNEE ARTHROPLASTY Right 06/10/2017   Procedure: RIGHT TOTAL KNEE ARTHROPLASTY;  Surgeon: Cammy Copa, MD;  Location: [facility] OR;  Service: Orthopedics;  Laterality: Right;  . TOTAL SHOULDER ARTHROPLASTY Right 10/06/2015   Procedure: RIGHT TOTAL SHOULDER ARTHROPLASTY;  Surgeon: Beverely Low, MD;  Location: [facility] OR;  Service: Orthopedics;  Laterality: Right;  . TOTAL SHOULDER ARTHROPLASTY Left 11/22/2016   Procedure: TOTAL SHOULDER ARTHROPLASTY;  Surgeon: Beverely Low, MD;  Location: [facility] OR;  Service: Orthopedics;  Laterality: Left;    Allergies  Allergen Reactions  . Codeine Rash and Other (See Comments)    SWEATING "HEART FLUTTERING"   . Sulfamethoxazole-Trimethoprim Rash    Social History   Tobacco Use  . Smoking status: Never Smoker  . Smokeless tobacco: Never Used  Substance Use Topics  . Alcohol use: No    Family History  Problem Relation Age of Onset  . CAD  Mother   . Emphysema Father   . Alcoholism Father    Prior to Admission medications   Medication Sig Start Date End Date Taking? Authorizing Provider  albuterol (PROVENTIL HFA;VENTOLIN HFA) 108 (90 Base) MCG/ACT inhaler Inhale 1-2 puffs into the lungs every 6 (six) hours as needed for wheezing or shortness of breath.   Yes [provider]  albuterol (PROVENTIL) (2.5 MG/3ML) 0.083% nebulizer solution Take 2.5 mg by nebulization every 6 (six) hours as needed for wheezing or shortness of breath. i puff once a day as needed   Yes [provider]  cetirizine (ZYRTEC) 10 MG tablet  Take 10 mg by mouth daily.   Yes [provider]  citalopram (CELEXA) 10 MG tablet Take 10 mg by mouth daily.   Yes [provider]  famotidine (PEPCID) 40 MG tablet Take 40 mg by mouth 2 (two) times daily. 08/16/16  Yes [provider]  ferrous sulfate 325 (65 FE) MG EC tablet Take 325 mg by mouth daily with breakfast.   Yes [provider]  HYDROcodone-acetaminophen (NORCO) 10-325 MG tablet Take 1 tablet by mouth 4 (four) times daily as needed. 02/20/20  Yes [provider]  levothyroxine (SYNTHROID, LEVOTHROID) 50 MCG tablet Take 50 mcg by mouth daily before breakfast. 10/26/16  Yes [provider]  losartan (COZAAR) 50 MG tablet Take 50 mg by mouth daily.   Yes [provider]  montelukast (SINGULAIR) 10 MG tablet Take 10 mg by mouth at bedtime.   Yes [provider]  omeprazole (PRILOSEC) 20 MG capsule Take 20 mg by mouth daily. 02/03/20  Yes [provider]  primidone (MYSOLINE) 50 MG tablet Take 50 mg by mouth at bedtime. 01/12/20  Yes [provider]  tiZANidine (ZANAFLEX) 4 MG tablet Take 1 tablet (4 mg total) by mouth every 6 (six) hours as needed for muscle spasms. Patient taking differently: Take 4 mg by mouth every 8 (eight) hours as needed for muscle spasms.  10/06/15  Benetta Spar, MD  Vitamin D, Ergocalciferol, (DRISDOL) 50000 UNITS CAPS capsule Take 50,000 Units by mouth every Monday.    Yes [provider]  oxyCODONE (OXY IR/ROXICODONE) 5 MG immediate release tablet 1 po q 8 hrs prn pain Patient not taking: Reported on 03/16/2020 07/28/17   Cammy Copa, MD  oxyCODONE-acetaminophen (ROXICET) 5-325 MG tablet Take 1-2 tablets by mouth every 6 (six) hours as needed for severe pain. Patient not taking: Reported on 03/16/2020 09/04/17   Kathryne Hitch, MD     Review of Systems  Positive ROS: As above  All other systems have been reviewed and were otherwise negative with the  exception of those mentioned in the HPI and as above.  Objective: Vital signs in last 24 hours: Temp:  [98.3 F (36.8 C)] 98.3 F (36.8 C) (07/21 1059) Pulse Rate:  [66] 66 (07/21 1059) Resp:  [17] 17 (07/21 1059) BP: (189-190)/(76-93) 189/93 (07/21 1135) SpO2:  [97 %] 97 % (07/21 1059) Weight:  [84.8 kg] 84.8 kg (07/21 1059) Estimated body mass index is 32.1 kg/m as calculated from the following:   Height as of this encounter: 5\' 4"  (1.626 m).   Weight as of this encounter: 84.8 kg.   General Appearance: Alert Head: Normocephalic, without obvious abnormality, atraumatic Eyes: PERRL, conjunctiva/corneas clear, EOM's intact,    Ears: Normal  Throat: Normal  Neck: Limited range of motion.  Her incision is well-healed. Back: The patient's lumbar incision is well-healed. Lungs: Clear to  auscultation bilaterally, respirations unlabored Heart: Regular rate and rhythm, no murmur, rub or gallop Abdomen: Soft, non-tender Extremities: Extremities normal, atraumatic, no cyanosis or edema Skin: unremarkable  NEUROLOGIC:   Mental status: alert and oriented,Motor Exam - grossly normal Sensory Exam - grossly normal Reflexes:  Coordination - grossly normal Gait - grossly normal Balance - grossly normal Cranial Nerves: I: smell Not tested  II: visual acuity  OS: Normal  OD: Normal   II: visual fields Full to confrontation  II: pupils Equal, round, reactive to light  III,VII: ptosis None  III,IV,VI: extraocular muscles  Full ROM  V: mastication Normal  V: facial light touch sensation  Normal  V,VII: corneal reflex  Present  VII: facial muscle function - upper  Normal  VII: facial muscle function - lower Normal  VIII: hearing Not tested  IX: soft palate elevation  Normal  IX,X: gag reflex Present  XI: trapezius strength  5/5  XI: sternocleidomastoid strength 5/5  XI: neck flexion strength  5/5  XII: tongue strength  Normal    Data Review Lab Results  Component Value Date    WBC 6.9 03/27/2020   HGB 12.5 03/27/2020   HCT 40.9 03/27/2020   MCV 99.0 03/27/2020   PLT 305 03/27/2020   Lab Results  Component Value Date   NA 138 03/27/2020   K 4.3 03/27/2020   CL 103 03/27/2020   CO2 26 03/27/2020   BUN 12 03/27/2020   CREATININE 1.98 (H) 03/27/2020   GLUCOSE 83 03/27/2020   Lab Results  Component Value Date   INR 1.29 04/20/2015    Assessment/Plan: L4-5 spondylolisthesis, facet arthropathy, lumbago, lumbar radiculopathy: I have discussed the situation with the patient.  I have reviewed her imaging studies with her and pointed out the abnormalities.  We have discussed the various treatment options including surgery.  I have described the surgical treatment option of an exploration of her lumbar fusion with an L4-5 decompression, instrumentation and fusion.  I have shown her surgical models.  I have given her a surgical pamphlet.  We have discussed the risks, benefits, alternatives, expected postoperative course, and likelihood of achieving our goals with surgery.  I have answered all her questions.  She has decided to proceed with surgery.   Cristi Loron 03/29/2020 11:40 AM

## 2020-03-29 NOTE — Transfer of Care (Signed)
Immediate Anesthesia Transfer of Care Note  Patient: Jade Walker  Procedure(s) Performed: POSTERIOR LUMBAR INTERBODY FUSION  LUMBAR FOUR- LUMBAR FIVE WITH REVISION OF LUMBAR THREE-FOUR FUSION (N/A Spine Lumbar)  Patient Location: PACU  Anesthesia Type:General  Level of Consciousness: awake, alert  and oriented  Airway & Oxygen Therapy: Patient Spontanous Breathing and Patient connected to face mask oxygen  Post-op Assessment: Report given to RN and Post -op Vital signs reviewed and stable  Post vital signs: Reviewed and stable  Last Vitals:  Vitals Value Taken Time  BP 126/74 03/29/20 1639  Temp    Pulse 83 03/29/20 1646  Resp 13 03/29/20 1646  SpO2 96 % 03/29/20 1646  Vitals shown include unvalidated device data.  Last Pain:  Vitals:   03/29/20 1125  TempSrc:   PainSc: 4          Complications: No complications documented.

## 2020-03-29 NOTE — Addendum Note (Signed)
Addendum  created 03/29/20 1659 by Bethena Midget, MD   Attestation recorded in Intraprocedure, Intraprocedure Attestations filed

## 2020-03-30 LAB — CBC
HCT: 32.1 % — ABNORMAL LOW (ref 36.0–46.0)
Hemoglobin: 10 g/dL — ABNORMAL LOW (ref 12.0–15.0)
MCH: 31.4 pg (ref 26.0–34.0)
MCHC: 31.2 g/dL (ref 30.0–36.0)
MCV: 100.9 fL — ABNORMAL HIGH (ref 80.0–100.0)
Platelets: 256 10*3/uL (ref 150–400)
RBC: 3.18 MIL/uL — ABNORMAL LOW (ref 3.87–5.11)
RDW: 12.4 % (ref 11.5–15.5)
WBC: 12.4 10*3/uL — ABNORMAL HIGH (ref 4.0–10.5)
nRBC: 0 % (ref 0.0–0.2)

## 2020-03-30 LAB — BASIC METABOLIC PANEL
Anion gap: 8 (ref 5–15)
BUN: 13 mg/dL (ref 8–23)
CO2: 20 mmol/L — ABNORMAL LOW (ref 22–32)
Calcium: 8.2 mg/dL — ABNORMAL LOW (ref 8.9–10.3)
Chloride: 106 mmol/L (ref 98–111)
Creatinine, Ser: 1.94 mg/dL — ABNORMAL HIGH (ref 0.44–1.00)
GFR calc Af Amer: 29 mL/min — ABNORMAL LOW (ref >60)
GFR calc non Af Amer: 25 mL/min — ABNORMAL LOW (ref >60)
Glucose, Bld: 128 mg/dL — ABNORMAL HIGH (ref 70–99)
Potassium: 4.7 mmol/L (ref 3.5–5.1)
Sodium: 134 mmol/L — ABNORMAL LOW (ref 135–145)

## 2020-03-30 MED ORDER — OXYCODONE-ACETAMINOPHEN 5-325 MG PO TABS: 1.0000 | ORAL_TABLET | ORAL | Status: DC | PRN

## 2020-03-30 MED ORDER — OXYCODONE-ACETAMINOPHEN 5-325 MG PO TABS: 1.0000 | ORAL_TABLET | ORAL | 0 refills | Status: AC | PRN

## 2020-03-30 MED ORDER — DOCUSATE SODIUM 100 MG PO CAPS: 100.0000 mg | ORAL_CAPSULE | Freq: Two times a day (BID) | ORAL | 0 refills | Status: AC

## 2020-03-30 MED FILL — Thrombin For Soln 5000 Unit: CUTANEOUS | Qty: 5000 | Status: AC

## 2020-03-30 NOTE — Evaluation (Signed)
Occupational Therapy Evaluation Patient Details Name: Jade Walker MRN: [id number] DOB: 1945-09-27 Today's Date: 03/30/2020    History of Present Illness This 74 y.o. female admitted for L4-5 PLIF.  PMH includes: s/p C3-4, C4-5, C5-6 ACDF, anxiety, asthma, COPD,  s/p Rt and Lt TSA, s/p Rt and Lt TKA    Clinical Impression   Patient evaluated by Occupational Therapy with no further acute OT needs identified. All education has been completed and the patient has no further questions. Pt able to perform ADLs with supervision with occasional min cues for back precautions.  Family very supportive and will be available to assist as needed.  See below for any follow-up Occupational Therapy or equipment needs. OT is signing off. Thank you for this referral.      Follow Up Recommendations  No OT follow up;Supervision/Assistance - 24 hour    Equipment Recommendations  None recommended by OT    Recommendations for Other Services       Precautions / Restrictions Precautions Precautions: Back Precaution Booklet Issued: Yes (comment) Precaution Comments: Pt able to state 3/3 back precautions, but requires min cues to adhere to them during eval  Restrictions Weight Bearing Restrictions: No      Mobility Bed Mobility Overal bed mobility: Needs Assistance Bed Mobility: Rolling Rolling: Supervision         General bed mobility comments: verbal cues for technique and safety   Transfers Overall transfer level: Needs assistance Equipment used: None Transfers: Sit to/from Stand;Stand Pivot Transfers Sit to Stand: Supervision Stand pivot transfers: Supervision            Balance Overall balance assessment: Mild deficits observed, not formally tested                                         ADL either performed or assessed with clinical judgement   ADL Overall ADL's : Needs assistance/impaired Eating/Feeding: Independent   Grooming: Wash/dry hands;Oral  care;Supervision/safety;Standing Grooming Details (indicate cue type and reason): reviewed safe technique  Upper Body Bathing: Supervision/ safety;Standing   Lower Body Bathing: Supervison/ safety;Sit to/from stand   Upper Body Dressing : Set up;Sitting   Lower Body Dressing: Supervision/safety;Sit to/from stand   Toilet Transfer: Supervision/safety;Ambulation;Comfort height toilet   Toileting- Clothing Manipulation and Hygiene: Supervision/safety;Sit to/from stand   Tub/ Shower Transfer: Tub transfer;Supervision/safety;Ambulation;Grab bars Tub/Shower Transfer Details (indicate cue type and reason): recommend that pt have supervision initially  Functional mobility during ADLs: Supervision/safety General ADL Comments: Pt initially required mod cues and min A to don brace, but with repetition, she was able to perform with supervision only.  Daughter will assist, and was able to verbalize understanding of how to don      Vision         Perception     Praxis      Pertinent Vitals/Pain Pain Assessment: Faces Faces Pain Scale: Hurts a little bit Pain Location: back Pain Descriptors / Indicators: Operative site guarding Pain Intervention(s): Monitored during session     Hand Dominance Right   Extremity/Trunk Assessment Upper Extremity Assessment Upper Extremity Assessment: Overall WFL for tasks assessed   Lower Extremity Assessment Lower Extremity Assessment: Defer to PT evaluation       Communication Communication Communication: No difficulties   Cognition Arousal/Alertness: Awake/alert Behavior During Therapy: WFL for tasks assessed/performed Overall Cognitive Status: Within Functional Limits for tasks assessed  General Comments       Exercises     Shoulder Instructions      Home Living Family/patient expects to be discharged to:: Private residence Living Arrangements: Spouse/significant other Available  Help at Discharge: Family;Available 24 hours/day Type of Home: House Home Access: Stairs to enter [employer] of Steps: 5 Entrance Stairs-Rails: Right;Left Home Layout: One level     Bathroom Shower/Tub: Tub/shower unit;Door   [employer] Toilet: Handicapped height Bathroom Accessibility: Yes How Accessible: Accessible via walker Home Equipment: Walker - 2 wheels;Cane - single point;Grab bars - tub/shower;Adaptive equipment Adaptive Equipment: Reacher;Sock aid;Long-handled shoe horn        Prior Functioning/Environment Level of Independence: Independent                 OT Problem List: Pain;Decreased knowledge of precautions;Decreased knowledge of use of DME or AE      OT Treatment/Interventions:      OT Goals(Current goals can be found in the care plan section) Acute Rehab OT Goals Patient Stated Goal: to go home ASAP  OT Goal Formulation: All assessment and education complete, DC therapy  OT Frequency:     Barriers to D/C:            Co-evaluation              AM-PAC OT "6 Clicks" Daily Activity     Outcome Measure Help from another person eating meals?: None Help from another person taking care of personal grooming?: A Little Help from another person toileting, which includes using toliet, bedpan, or urinal?: A Little Help from another person bathing (including washing, rinsing, drying)?: A Little Help from another person to put on and taking off regular upper body clothing?: A Little Help from another person to put on and taking off regular lower body clothing?: A Little 6 Click Score: 19   End of Session Equipment Utilized During Treatment: Back brace Nurse Communication: Mobility status  Activity Tolerance: Patient tolerated treatment well Patient left: in bed;with call bell/phone within reach;with family/visitor present  OT Visit Diagnosis: Pain Pain - part of body:  (back )                Time: 0947-0962 OT Time Calculation (min):  21 min Charges:  OT General Charges $OT Visit: 1 Visit OT Evaluation $OT Eval Low Complexity: 1 Low  Eber Jones., OTR/L Acute Rehabilitation Services Pager [phone] Office [phone]   Jeani Hawking M 03/30/2020, 9:58 AM

## 2020-03-30 NOTE — Discharge Summary (Signed)
Physician Discharge Summary  Patient ID: Jade Walker MRN: [id number] DOB/AGE: [birth year] 74 y.o.  Admit date: 03/29/2020 Discharge date: 03/30/2020  Admission Diagnoses: Lumbar adjacent disease with spondylolisthesis, lumbago, lumbar radiculopathy  Discharge Diagnoses: The same Active Problems:   Lumbar adjacent segment disease with spondylolisthesis   Discharged Condition: good  Hospital Course: I performed an exploration of the patient's lumbar fusion with an L4-5 decompression, instrumentation and fusion on 03/29/2020.  The surgery went well.  The patient's postoperative course was unremarkable.  On postoperative day #1 she requested discharge home.  She was given written and oral discharge instructions.  All her questions were answered.  Consults: PT, OT, care management Significant Diagnostic Studies: None Treatments: Exploration of lumbar fusion, removal of lumbar hardware, L4-5 decompression, instrumentation and fusion. Discharge Exam: Blood pressure 101/60, pulse 71, temperature 98.2 F (36.8 C), temperature source Oral, resp. rate 18, height 5\' 4"  (1.626 m), weight 84.8 kg, SpO2 98 %. The patient is alert and pleasant.  She looks well.  Her lower extremity strength is normal.  Her dressing is clean and dry.  Disposition: Home  Discharge Instructions    Call MD for:  difficulty breathing, headache or visual disturbances   Complete by: As directed    Call MD for:  extreme fatigue   Complete by: As directed    Call MD for:  hives   Complete by: As directed    Call MD for:  persistant dizziness or light-headedness   Complete by: As directed    Call MD for:  persistant nausea and vomiting   Complete by: As directed    Call MD for:  redness, tenderness, or signs of infection (pain, swelling, redness, odor or green/yellow discharge around incision site)   Complete by: As directed    Call MD for:  severe uncontrolled pain   Complete by: As directed    Call MD for:   temperature >100.4   Complete by: As directed    Diet - low sodium heart healthy   Complete by: As directed    Discharge instructions   Complete by: As directed    Call [phone] for a followup appointment. Take a stool softener while you are using pain medications.   Driving Restrictions   Complete by: As directed    Do not drive for 2 weeks.   Increase activity slowly   Complete by: As directed    Lifting restrictions   Complete by: As directed    Do not lift more than 5 pounds. No excessive bending or twisting.   May shower / Bathe   Complete by: As directed    Remove the dressing for 3 days after surgery.  You may shower, but leave the incision alone.   Remove dressing in 48 hours   Complete by: As directed      Allergies as of 03/30/2020      Reactions   Codeine Rash, Other (See Comments)   SWEATING "HEART FLUTTERING"   Sulfamethoxazole-trimethoprim Rash      Medication List    STOP taking these medications   HYDROcodone-acetaminophen 10-325 MG tablet Commonly known as: NORCO   oxyCODONE 5 MG immediate release tablet Commonly known as: Oxy IR/ROXICODONE     TAKE these medications   albuterol (2.5 MG/3ML) 0.083% nebulizer solution Commonly known as: PROVENTIL Take 2.5 mg by nebulization every 6 (six) hours as needed for wheezing or shortness of breath. i puff once a day as needed   albuterol 108 (90 Base) MCG/ACT  inhaler Commonly known as: VENTOLIN HFA Inhale 1-2 puffs into the lungs every 6 (six) hours as needed for wheezing or shortness of breath.   cetirizine 10 MG tablet Commonly known as: ZYRTEC Take 10 mg by mouth daily.   citalopram 10 MG tablet Commonly known as: CELEXA Take 10 mg by mouth daily.   docusate sodium 100 MG capsule Commonly known as: COLACE Take 1 capsule (100 mg total) by mouth 2 (two) times daily.   famotidine 40 MG tablet Commonly known as: PEPCID Take 40 mg by mouth 2 (two) times daily.   ferrous sulfate 325 (65 FE) MG  EC tablet Take 325 mg by mouth daily with breakfast.   levothyroxine 50 MCG tablet Commonly known as: SYNTHROID Take 50 mcg by mouth daily before breakfast.   losartan 50 MG tablet Commonly known as: COZAAR Take 50 mg by mouth daily.   montelukast 10 MG tablet Commonly known as: SINGULAIR Take 10 mg by mouth at bedtime.   omeprazole 20 MG capsule Commonly known as: PRILOSEC Take 20 mg by mouth daily.   oxyCODONE-acetaminophen 5-325 MG tablet Commonly known as: PERCOCET/ROXICET Take 1-2 tablets by mouth every 4 (four) hours as needed for moderate pain. What changed:   when to take this  reasons to take this   primidone 50 MG tablet Commonly known as: MYSOLINE Take 50 mg by mouth at bedtime.   tiZANidine 4 MG tablet Commonly known as: Zanaflex Take 1 tablet (4 mg total) by mouth every 6 (six) hours as needed for muscle spasms. What changed: when to take this   Vitamin D (Ergocalciferol) 1.25 MG (50000 UNIT) Caps capsule Commonly known as: DRISDOL Take 50,000 Units by mouth every Monday.        Signed: Cristi Loron 03/30/2020, 8:11 AM

## 2020-03-30 NOTE — Discharge Instructions (Signed)

## 2020-03-30 NOTE — Progress Notes (Signed)
Patient is discharged from room 3C02 at this time. Alert and in stable condition. IV site d/c'd and instructions read to patient and daughter with understanding verbalized and all questions answered. Left unit via wheelchair with all belongings at side. 

## 2020-03-30 NOTE — Evaluation (Signed)
Physical Therapy Evaluation and Discharge Patient Details Name: Jade Walker MRN: [id number] DOB: December 13, 1945 Today's Date: 03/30/2020   History of Present Illness  This 74 y.o. female admitted for L4-5 PLIF.  PMH includes: s/p C3-4, C4-5, C5-6 ACDF, anxiety, asthma, COPD,  s/p Rt and Lt TSA, s/p Rt and Lt TKA   Clinical Impression  Patient evaluated by Physical Therapy with no further acute PT needs identified. All education has been completed and the patient has no further questions. Pt was able to demonstrate transfers and ambulation with gross supervision for safety and no AD. Pt was educated on precautions, brace application/wearing schedule, appropriate activity progression, and car transfer. Overall she was open to education however points out several times she has had other back surgeries and is already aware of precautions, despite requiring cues to adhere to precautions throughout session. Overall pt is safe for d/c from a PT standpoint. See below for any follow-up Physical Therapy or equipment needs. PT is signing off. Thank you for this referral.     Follow Up Recommendations No PT follow up;Supervision for mobility/OOB    Equipment Recommendations  None recommended by PT    Recommendations for Other Services       Precautions / Restrictions Precautions Precautions: Back Precaution Booklet Issued: Yes (comment) Precaution Comments: Pt able to state 3/3 back precautions, but requires min cues to adhere to them during eval  Restrictions Weight Bearing Restrictions: No      Mobility  Bed Mobility Overal bed mobility: Needs Assistance Bed Mobility: Rolling Rolling: Supervision         General bed mobility comments: Pt was received sitting up EOB   Transfers Overall transfer level: Needs assistance Equipment used: None Transfers: Sit to/from [employer] Sit to Stand: Supervision Stand pivot transfers: Supervision       General transfer  comment: VC's for wide BOS, and hand placement on seated surface for safety. Good control to descend down to sitting position.   Ambulation/Gait Ambulation/Gait assistance: Supervision Gait Distance (Feet): 300 Feet Assistive device: None Gait Pattern/deviations: Step-through pattern;Decreased stride length;Trunk flexed Gait velocity: Decreased Gait velocity interpretation: <1.8 ft/sec, indicate of risk for recurrent falls General Gait Details: VC's for improved posutre. Generally steady without UE support.   Stairs Stairs: Yes Stairs assistance: Min guard;Supervision Stair Management: One rail Left;Forwards Number of Stairs: 5 General stair comments: VC's for sequencing and general safety. No assist required.   Wheelchair Mobility    Modified Rankin (Stroke Patients Only)       Balance Overall balance assessment: Mild deficits observed, not formally tested                                           Pertinent Vitals/Pain Pain Assessment: Faces Faces Pain Scale: Hurts a little bit Pain Location: back Pain Descriptors / Indicators: Operative site guarding Pain Intervention(s): Limited activity within patient's tolerance;Monitored during session;Repositioned    Home Living Family/patient expects to be discharged to:: Private residence Living Arrangements: Spouse/significant other Available Help at Discharge: Family;Available 24 hours/day Type of Home: House Home Access: Stairs to enter Entrance Stairs-Rails: Doctor, general practice of Steps: 5 Home Layout: One level Home Equipment: Walker - 2 wheels;Cane - single point;Grab bars - tub/shower;Adaptive equipment      Prior Function Level of Independence: Independent  Hand Dominance   Dominant Hand: Right    Extremity/Trunk Assessment   Upper Extremity Assessment Upper Extremity Assessment: Defer to OT evaluation    Lower Extremity Assessment Lower Extremity  Assessment: Generalized weakness (Consistent with pre-op diagnosis)    Cervical / Trunk Assessment Cervical / Trunk Assessment: Other exceptions Cervical / Trunk Exceptions: s/p surgery  Communication   Communication: No difficulties  Cognition Arousal/Alertness: Awake/alert Behavior During Therapy: WFL for tasks assessed/performed Overall Cognitive Status: Within Functional Limits for tasks assessed                                        General Comments      Exercises     Assessment/Plan    PT Assessment Patent does not need any further PT services  PT Problem List         PT Treatment Interventions      PT Goals (Current goals can be found in the Care Plan section)  Acute Rehab PT Goals Patient Stated Goal: to go home ASAP  PT Goal Formulation: All assessment and education complete, DC therapy    Frequency     Barriers to discharge        Co-evaluation               AM-PAC PT "6 Clicks" Mobility  Outcome Measure Help needed turning from your back to your side while in a flat bed without using bedrails?: None Help needed moving from lying on your back to sitting on the side of a flat bed without using bedrails?: None Help needed moving to and from a bed to a chair (including a wheelchair)?: None Help needed standing up from a chair using your arms (e.g., wheelchair or bedside chair)?: None Help needed to walk in hospital room?: None Help needed climbing 3-5 steps with a railing? : A Little 6 Click Score: 23    End of Session Equipment Utilized During Treatment: Gait belt;Back brace Activity Tolerance: Patient tolerated treatment well Patient left: in chair;with call bell/phone within reach;with nursing/sitter in room Nurse Communication: Mobility status PT Visit Diagnosis: Unsteadiness on feet (R26.81);Pain Pain - part of body:  (back)    Time: 6063-0160 PT Time Calculation (min) (ACUTE ONLY): 16 min   Charges:   PT  Evaluation $PT Eval Low Complexity: 1 Low          Jade Walker, PT, DPT Acute Rehabilitation Services Pager: [phone] Office: [phone]   Jade Walker 03/30/2020, 10:51 AM

## 2020-04-04 MED FILL — Heparin Sodium (Porcine) Inj 1000 Unit/ML: INTRAMUSCULAR | Qty: 30 | Status: AC

## 2020-04-04 MED FILL — Sodium Chloride IV Soln 0.9%: INTRAVENOUS | Qty: 1000 | Status: AC

## 2024-07-06 ENCOUNTER — Other Ambulatory Visit: Payer: Self-pay | Admitting: Nephrology

## 2024-07-06 DIAGNOSIS — N184 Chronic kidney disease, stage 4 (severe): Secondary | ICD-10-CM

## 2024-07-21 ENCOUNTER — Ambulatory Visit
Admission: RE | Admit: 2024-07-21 | Discharge: 2024-07-21 | Disposition: A | Discharge status: Home / Self Care | Source: Ambulatory Visit | Attending: Nephrology | Admitting: Nephrology

## 2024-07-21 DIAGNOSIS — N184 Chronic kidney disease, stage 4 (severe): Secondary | ICD-10-CM
